# Patient Record
Sex: Female | Born: 1975 | Race: Black or African American | Hispanic: No | Marital: Married | Smoking: Never smoker
Health system: Southern US, Community
[De-identification: ages and names within clinical notes are randomized; demographics above are authoritative.]

## PROBLEM LIST (undated history)

## (undated) DIAGNOSIS — E119 Type 2 diabetes mellitus without complications: Secondary | ICD-10-CM

## (undated) DIAGNOSIS — E1169 Type 2 diabetes mellitus with other specified complication: Secondary | ICD-10-CM

## (undated) DIAGNOSIS — I1A0 Resistant hypertension: Secondary | ICD-10-CM

## (undated) DIAGNOSIS — E785 Hyperlipidemia, unspecified: Secondary | ICD-10-CM

## (undated) DIAGNOSIS — I10 Essential (primary) hypertension: Secondary | ICD-10-CM

## (undated) HISTORY — DX: Resistant hypertension: I1A.0

## (undated) HISTORY — DX: Morbid (severe) obesity due to excess calories: E66.01

## (undated) HISTORY — DX: Type 2 diabetes mellitus with other specified complication: E11.69

## (undated) HISTORY — DX: Type 2 diabetes mellitus with other specified complication: E78.5

---

## 2021-03-17 DIAGNOSIS — I110 Hypertensive heart disease with heart failure: Secondary | ICD-10-CM

## 2021-03-17 DIAGNOSIS — I5032 Chronic diastolic (congestive) heart failure: Secondary | ICD-10-CM

## 2021-03-17 HISTORY — DX: Hypertensive heart disease with heart failure: I11.0

## 2021-03-17 HISTORY — DX: Chronic diastolic (congestive) heart failure: I50.32

## 2021-03-17 HISTORY — PX: TRANSTHORACIC ECHOCARDIOGRAM: SHX275

## 2021-03-25 ENCOUNTER — Emergency Department (HOSPITAL_COMMUNITY): Payer: BC Managed Care – PPO

## 2021-03-25 ENCOUNTER — Other Ambulatory Visit: Payer: Self-pay

## 2021-03-25 ENCOUNTER — Encounter (HOSPITAL_COMMUNITY): Payer: Self-pay | Admitting: Emergency Medicine

## 2021-03-25 ENCOUNTER — Inpatient Hospital Stay (HOSPITAL_COMMUNITY)
Admission: EM | Admit: 2021-03-25 | Discharge: 2021-03-27 | DRG: 291 | Disposition: A | Discharge status: Home / Self Care | Payer: BC Managed Care – PPO | Attending: Internal Medicine | Admitting: Internal Medicine

## 2021-03-25 DIAGNOSIS — D509 Iron deficiency anemia, unspecified: Secondary | ICD-10-CM | POA: Diagnosis present

## 2021-03-25 DIAGNOSIS — R0602 Shortness of breath: Secondary | ICD-10-CM | POA: Diagnosis not present

## 2021-03-25 DIAGNOSIS — Z8673 Personal history of transient ischemic attack (TIA), and cerebral infarction without residual deficits: Secondary | ICD-10-CM

## 2021-03-25 DIAGNOSIS — Z833 Family history of diabetes mellitus: Secondary | ICD-10-CM

## 2021-03-25 DIAGNOSIS — E785 Hyperlipidemia, unspecified: Secondary | ICD-10-CM | POA: Diagnosis present

## 2021-03-25 DIAGNOSIS — R079 Chest pain, unspecified: Secondary | ICD-10-CM

## 2021-03-25 DIAGNOSIS — I5033 Acute on chronic diastolic (congestive) heart failure: Secondary | ICD-10-CM

## 2021-03-25 DIAGNOSIS — I110 Hypertensive heart disease with heart failure: Principal | ICD-10-CM | POA: Diagnosis present

## 2021-03-25 DIAGNOSIS — D75839 Thrombocytosis, unspecified: Secondary | ICD-10-CM | POA: Diagnosis present

## 2021-03-25 DIAGNOSIS — E1169 Type 2 diabetes mellitus with other specified complication: Secondary | ICD-10-CM

## 2021-03-25 DIAGNOSIS — Z6841 Body Mass Index (BMI) 40.0 and over, adult: Secondary | ICD-10-CM

## 2021-03-25 DIAGNOSIS — E1165 Type 2 diabetes mellitus with hyperglycemia: Secondary | ICD-10-CM | POA: Diagnosis present

## 2021-03-25 DIAGNOSIS — E876 Hypokalemia: Secondary | ICD-10-CM | POA: Diagnosis present

## 2021-03-25 DIAGNOSIS — I422 Other hypertrophic cardiomyopathy: Secondary | ICD-10-CM | POA: Diagnosis present

## 2021-03-25 DIAGNOSIS — E119 Type 2 diabetes mellitus without complications: Secondary | ICD-10-CM

## 2021-03-25 DIAGNOSIS — I160 Hypertensive urgency: Secondary | ICD-10-CM | POA: Diagnosis present

## 2021-03-25 DIAGNOSIS — I5031 Acute diastolic (congestive) heart failure: Secondary | ICD-10-CM | POA: Diagnosis present

## 2021-03-25 DIAGNOSIS — R778 Other specified abnormalities of plasma proteins: Secondary | ICD-10-CM | POA: Diagnosis present

## 2021-03-25 DIAGNOSIS — Z8249 Family history of ischemic heart disease and other diseases of the circulatory system: Secondary | ICD-10-CM

## 2021-03-25 DIAGNOSIS — Z20822 Contact with and (suspected) exposure to covid-19: Secondary | ICD-10-CM | POA: Diagnosis present

## 2021-03-25 DIAGNOSIS — F419 Anxiety disorder, unspecified: Secondary | ICD-10-CM | POA: Diagnosis present

## 2021-03-25 DIAGNOSIS — Z79899 Other long term (current) drug therapy: Secondary | ICD-10-CM

## 2021-03-25 HISTORY — DX: Type 2 diabetes mellitus without complications: E11.9

## 2021-03-25 HISTORY — DX: Essential (primary) hypertension: I10

## 2021-03-25 LAB — BASIC METABOLIC PANEL
Anion gap: 11 (ref 5–15)
BUN: 20 mg/dL (ref 6–20)
CO2: 25 mmol/L (ref 22–32)
Calcium: 9.4 mg/dL (ref 8.9–10.3)
Chloride: 99 mmol/L (ref 98–111)
Creatinine, Ser: 1.03 mg/dL — ABNORMAL HIGH (ref 0.44–1.00)
GFR, Estimated: >60 mL/min (ref >60)
Glucose, Bld: 312 mg/dL — ABNORMAL HIGH (ref 70–99)
Potassium: 3.6 mmol/L (ref 3.5–5.1)
Sodium: 135 mmol/L (ref 135–145)

## 2021-03-25 LAB — URINALYSIS, ROUTINE W REFLEX MICROSCOPIC
Bacteria, UA: NONE SEEN
Bilirubin Urine: NEGATIVE
Glucose, UA: >=500 mg/dL — AB
Hgb urine dipstick: NEGATIVE
Ketones, ur: NEGATIVE mg/dL
Leukocytes,Ua: NEGATIVE
Nitrite: NEGATIVE
Protein, ur: NEGATIVE mg/dL
Specific Gravity, Urine: 1.029 (ref 1.005–1.030)
pH: 6 (ref 5.0–8.0)

## 2021-03-25 LAB — I-STAT BETA HCG BLOOD, ED (MC, WL, AP ONLY): I-stat hCG, quantitative: <5 m[IU]/mL (ref <5)

## 2021-03-25 LAB — CBC
HCT: 36.9 % (ref 36.0–46.0)
Hemoglobin: 10.6 g/dL — ABNORMAL LOW (ref 12.0–15.0)
MCH: 20.3 pg — ABNORMAL LOW (ref 26.0–34.0)
MCHC: 28.7 g/dL — ABNORMAL LOW (ref 30.0–36.0)
MCV: 70.7 fL — ABNORMAL LOW (ref 80.0–100.0)
Platelets: 420 10*3/uL — ABNORMAL HIGH (ref 150–400)
RBC: 5.22 MIL/uL — ABNORMAL HIGH (ref 3.87–5.11)
RDW: 18 % — ABNORMAL HIGH (ref 11.5–15.5)
WBC: 9.1 10*3/uL (ref 4.0–10.5)
nRBC: 0 % (ref 0.0–0.2)

## 2021-03-25 LAB — I-STAT VENOUS BLOOD GAS, ED
Acid-Base Excess: 3 mmol/L — ABNORMAL HIGH (ref 0.0–2.0)
Bicarbonate: 28.9 mmol/L — ABNORMAL HIGH (ref 20.0–28.0)
Calcium, Ion: 1.24 mmol/L (ref 1.15–1.40)
HCT: 39 % (ref 36.0–46.0)
Hemoglobin: 13.3 g/dL (ref 12.0–15.0)
O2 Saturation: 85 %
Potassium: 3.6 mmol/L (ref 3.5–5.1)
Sodium: 135 mmol/L (ref 135–145)
TCO2: 30 mmol/L (ref 22–32)
pCO2, Ven: 48.7 mmHg (ref 44–60)
pH, Ven: 7.382 (ref 7.25–7.43)
pO2, Ven: 52 mmHg — ABNORMAL HIGH (ref 32–45)

## 2021-03-25 LAB — CBG MONITORING, ED: Glucose-Capillary: 365 mg/dL — ABNORMAL HIGH (ref 70–99)

## 2021-03-25 LAB — BRAIN NATRIURETIC PEPTIDE: B Natriuretic Peptide: 224.7 pg/mL — ABNORMAL HIGH (ref 0.0–100.0)

## 2021-03-25 LAB — TROPONIN I (HIGH SENSITIVITY)
Troponin I (High Sensitivity): 29 ng/L — ABNORMAL HIGH (ref <18)
Troponin I (High Sensitivity): 36 ng/L — ABNORMAL HIGH (ref <18)

## 2021-03-25 MED ORDER — ASPIRIN 325 MG PO TABS
325.0000 mg | ORAL_TABLET | Freq: Every day | ORAL | Status: DC
  Administered 2021-03-26 – 2021-03-27 (×2): 325 mg via ORAL
  Filled 2021-03-25 (×2): qty 1

## 2021-03-25 MED ORDER — METOPROLOL TARTRATE 25 MG PO TABS
12.5000 mg | ORAL_TABLET | Freq: Once | ORAL | Status: AC
  Administered 2021-03-25: 12.5 mg via ORAL
  Filled 2021-03-25: qty 1

## 2021-03-25 MED ORDER — IOHEXOL 350 MG/ML SOLN
80.0000 mL | Freq: Once | INTRAVENOUS | Status: AC | PRN
  Administered 2021-03-25: 22:00:00 80 mL via INTRAVENOUS

## 2021-03-25 MED ORDER — LABETALOL HCL 5 MG/ML IV SOLN
10.0000 mg | Freq: Once | INTRAVENOUS | Status: AC
  Administered 2021-03-25: 22:00:00 10 mg via INTRAVENOUS
  Filled 2021-03-25: qty 4

## 2021-03-25 NOTE — ED Provider Notes (Signed)
[facility] Provider Note   CSN: [id number] Arrival date & time: 03/25/21  1352     History  Chief Complaint  Patient presents with   Shortness of Breath    Kim Werner is a 46 y.o. female presenting today for intermittent shortness of breath and palpitations over the last week, and new onset of chest pain today.  Shortness of breath occurs randomly and lasts no more than an hour.  Patient unsure of what preceded the symptoms a week ago.  Denies recent fever or upper respiratory illness.  Chest pain described as central and nonradiating.  Believes shortness of breath due to pain with deep inspiration.  Normal blood glucose ranges between 200-300.  Currently takes amlodipine 5 mg daily for blood pressure management.  Denies headache, numbness/tingling, facial asymmetry, or other neurodeficits.  Denies urinary or GI symptoms  Hx of diabetes mellitus and hypertension.  Hx of TIA  The history is provided by the patient and medical records.  Shortness of Breath Associated symptoms: chest pain       Home Medications Prior to Admission medications   Not on File      Allergies    Patient has no known allergies.    Review of Systems   Review of Systems  Respiratory:  Positive for shortness of breath.   Cardiovascular:  Positive for chest pain.   Physical Exam Updated Vital Signs BP (!) 198/123    Pulse 100    Temp 98.6 F (37 C) (Oral)    Resp (!) 24    LMP 03/18/2021    SpO2 100%  Physical Exam Vitals and nursing note reviewed.  Constitutional:      General: She is not in acute distress.    Appearance: Normal appearance. She is well-developed. She is obese. She is not ill-appearing or diaphoretic.  HENT:     Head: Normocephalic and atraumatic.     Mouth/Throat:     Mouth: Mucous membranes are moist.     Pharynx: Oropharynx is clear.  Eyes:     General: No scleral icterus.    Conjunctiva/sclera: Conjunctivae normal.   Cardiovascular:     Rate and Rhythm: Regular rhythm. Tachycardia present.     Pulses: Normal pulses.     Heart sounds: Normal heart sounds. No murmur heard. Pulmonary:     Effort: Pulmonary effort is normal. Tachypnea present. No accessory muscle usage or respiratory distress.     Breath sounds: No stridor. Wheezing (Mild) present.  Chest:     Chest wall: No tenderness.  Abdominal:     Palpations: Abdomen is soft.     Tenderness: There is no abdominal tenderness.  Musculoskeletal:        General: No swelling.     Cervical back: Neck supple.     Right lower leg: No edema.     Left lower leg: No edema.  Skin:    General: Skin is warm and dry.     Capillary Refill: Capillary refill takes less than 2 seconds.     Coloration: Skin is not pale.  Neurological:     Mental Status: She is alert and oriented to person, place, and time.     Motor: No weakness.  Psychiatric:        Mood and Affect: Mood normal.    ED Results / Procedures / Treatments   Labs (all labs ordered are listed, but only abnormal results are displayed) Labs Reviewed  BASIC METABOLIC PANEL -  Abnormal; Notable for the following components:      Result Value   Glucose, Bld 312 (*)    Creatinine, Ser 1.03 (*)    All other components within normal limits  CBC - Abnormal; Notable for the following components:   RBC 5.22 (*)    Hemoglobin 10.6 (*)    MCV 70.7 (*)    MCH 20.3 (*)    MCHC 28.7 (*)    RDW 18.0 (*)    Platelets 420 (*)    All other components within normal limits  URINALYSIS, ROUTINE W REFLEX MICROSCOPIC - Abnormal; Notable for the following components:   Color, Urine STRAW (*)    Glucose, UA >=500 (*)    All other components within normal limits  BRAIN NATRIURETIC PEPTIDE - Abnormal; Notable for the following components:   B Natriuretic Peptide 224.7 (*)    All other components within normal limits  I-STAT VENOUS BLOOD GAS, ED - Abnormal; Notable for the following components:   pO2, Ven 52  (*)    Bicarbonate 28.9 (*)    Acid-Base Excess 3.0 (*)    All other components within normal limits  TROPONIN I (HIGH SENSITIVITY) - Abnormal; Notable for the following components:   Troponin I (High Sensitivity) 29 (*)    All other components within normal limits  TROPONIN I (HIGH SENSITIVITY) - Abnormal; Notable for the following components:   Troponin I (High Sensitivity) 36 (*)    All other components within normal limits  I-STAT BETA HCG BLOOD, ED ([facility], [facility], [facility] ONLY)    EKG EKG Interpretation  Date/Time:  Thursday March 25 2021 14:09:35 EST Ventricular Rate:  103 PR Interval:  170 QRS Duration: 78 QT Interval:  364 QTC Calculation: 476 R Axis:   64 Text Interpretation: Sinus tachycardia Otherwise normal ECG No previous ECGs available Confirmed by Coralee Pesa [phone]) on 03/25/2021 8:40:13 PM  Radiology DG Chest 2 View  Result Date: 03/25/2021 CLINICAL DATA:  Shortness of breath EXAM: CHEST - 2 VIEW COMPARISON:  None. FINDINGS: The heart size and mediastinal contours are within normal limits. Low lung volumes with prominence of the bronchovascular markings. No focal consolidation or pleural effusion. The visualized skeletal structures are unremarkable. IMPRESSION: No active cardiopulmonary disease. Electronically Signed   By: Larose Hires D.O.   On: 03/25/2021 14:57    Procedures Procedures    Medications Ordered in ED Medications  labetalol (NORMODYNE) injection 10 mg (10 mg Intravenous Given 03/25/21 2136)    ED Course/ Medical Decision Making/ A&P Clinical Course as of 03/25/21 2203  Thu Mar 25, 2021  2037 Glucose(!): 312 [AC]    Clinical Course User Index [AC] Cecil Cobbs, PA-C                           Medical Decision Making Amount and/or Complexity of Data Reviewed External Data Reviewed: notes. Labs: ordered. Decision-making details documented in ED Course. Radiology: ordered and independent interpretation performed. Decision-making details  documented in ED Course. ECG/medicine tests: ordered and independent interpretation performed. Decision-making details documented in ED Course.  Risk OTC drugs. Prescription drug management. Decision regarding hospitalization.   46 y.o. female presents to the ED for concern of Shortness of Breath, palpitations, and chest pain.  This involves an extensive number of treatment options, and is a complaint that carries with it a high risk of complications and morbidity.  The differential diagnosis includes acute coronary syndrome, musculoskeletal benign cause, pulmonary embolism,  pneumothorax, rib fracture  Comorbidities that complicate the patient evaluation include chronic hypertension, history of TIA  Additional history obtained from internal/external records available via epic  Interpretation: I ordered, and personally interpreted labs.  The pertinent results include: Elevated troponin of 36.  Electrolytes unremarkable.  Mild elevated creatinine 1.03.  Slightly decreased hemoglobin of 10.6, but normal WBC not suggestive of infection/inflammatory process.  I ordered imaging studies including chest x-ray and CTA of the chest.  I independently visualized and interpreted chest x-ray which showed no active cardiopulmonary disease or process.  I also independently visualized and interpreted the CTA of the chest showed no evidence of pulmonary embolism.  I agree with the radiologist interpretation  Intervention: I ordered medication including labetalol for blood pressure management.  Reevaluation of the patient after these medicines showed that the patient remained hypertensive.  I ordered metoprolol for added blood pressure management.  Reevaluation of the patient after this intervention has showed decreased blood pressure on repeat vitals and mild improvement of symptoms.  I have reviewed the patients home medicines and have made adjustments as needed  The patient was maintained on a cardiac  monitor.  I personally viewed and interpreted the cardiac monitored which showed an underlying rhythm of: Sinus tachycardia and normal sinus rhythm  I requested consultation with the Hospitalist team, and discussed lab and imaging findings as well as pertinent plan - they recommend: admission for further evaluation and treatment.  ED Course: Considered GERD or esophagitis, but physical exam, patient history, and lab findings not supportive of this.  Considered pulmonary edema or pneumothorax, but imaging not supportive of this.  Elevated troponins and history of patient proposed strong suspicion for cardiac nature.  Mildly elevated BNP around 220 not significant enough to suggest severe heart failure, especially in consideration with imaging, physical exam, and other labs.  No rib fracture appreciated on chest x-ray nor correlated on physical exam.  Chest x-ray and CTA scan negative for pulmonary embolism, pneumothorax, pulmonary edema, pneumonia, or other acute pulmonary pathology.  EKG not suggestive of STEMI or other signs of ischemia or arrhythmias, but does not rule out acute coronary syndrome or arrhythmias.  Patient has chronic history of hypertension, but states it is never been as high as this or having accompanying symptoms.  Patient understands and admits she is a poorly controlled diabetic.  Labs, history, presentation not suggestive of DKA or other acute metabolic process or acidosis.  May be more of a incidental finding than a direct contributing factor to the patient's presentation.  Overall, I am uncertain the exact etiology of the patient's symptoms.  I believe further work-up and evaluation would best benefit this patient, as I am concerned of the combination of continued hypertension, elevated troponins, and patient symptomology/complaints without clear cause.  Disposition: I discussed the patient and their case with my attending, Dr. Wilkie Aye, who agreed with the proposed treatment course.   After consideration of the diagnostic results and the patient's response to treatment, I feel that the patent would benefit from admission for further evaluation and treatment.  Discussed course of treatment thoroughly with the patient and her husband, whom demonstrated understanding.  Patient in agreement and has no further questions.        Final Clinical Impression(s) / ED Diagnoses Final diagnoses:  Shortness of breath  Chest pain, unspecified type    Rx / DC Orders ED Discharge Orders     None         Cecil Cobbs, PA-C  03/26/21 0155    Rozelle Logan, DO 03/27/21 [phone]

## 2021-03-25 NOTE — ED Provider Triage Note (Signed)
Emergency Medicine Provider Triage Evaluation Note ? ?Kim Werner , a 46 y.o. female  was evaluated in triage.  Pt complains of chest pain and shortness of breath.  Patient states that today she was going into school where she denies when she began having shortness of breath.  She states that she felt like she was hyperventilating and had to go outside.  She states that after she began having shortness of breath she began having central, nonradiating chest pain that she describes as pressure.  She states she continues to have some mild chest pain but the shortness of breath has improved.  She denies palpitations, lightheadedness or dizziness, syncope, lower extremity swelling.. ? ?Review of Systems  ?Positive: See above ?Negative:  ? ?Physical Exam  ?BP (!) 219/138 (BP Location: Right Arm)   Pulse (!) 101   Temp 98.7 ?F (37.1 ?C)   Resp 20   SpO2 100%  ?Gen:   Awake, no distress   ?Resp:  Normal effort, lungs clear to auscultation bilaterally ?MSK:   Moves extremities without difficulty  ?Other:  S1/S2 without murmur.  No lower extremity swelling. ? ?Medical Decision Making  ?Medically screening exam initiated at 2:31 PM.  Appropriate orders placed.  Kim Stephannie Broner was informed that the remainder of the evaluation will be completed by another provider, this initial triage assessment does not replace that evaluation, and the importance of remaining in the ED until their evaluation is complete. ? ?Notably hypertensive and triage.  Initiating chest pain work-up ?  ?Cristopher Peru, PA-C ?03/25/21 1432 ? ?

## 2021-03-25 NOTE — ED Notes (Signed)
[place] PA notified regarding pts BP 196/124 ?

## 2021-03-25 NOTE — ED Notes (Signed)
Patient transported to CT 

## 2021-03-25 NOTE — ED Triage Notes (Signed)
Patient here with complaint of shortness of breath that started one week ago, came in today because shortness of breath is now accompanied by chest pain with inspiration. Patient alert, oriented, speaking in complete sentences, and in no  apparent distress at this time. ?

## 2021-03-26 ENCOUNTER — Encounter (HOSPITAL_COMMUNITY): Payer: Self-pay | Admitting: Family Medicine

## 2021-03-26 ENCOUNTER — Other Ambulatory Visit: Payer: Self-pay

## 2021-03-26 ENCOUNTER — Other Ambulatory Visit (HOSPITAL_COMMUNITY): Payer: Self-pay

## 2021-03-26 ENCOUNTER — Observation Stay (HOSPITAL_COMMUNITY): Payer: BC Managed Care – PPO

## 2021-03-26 DIAGNOSIS — F419 Anxiety disorder, unspecified: Secondary | ICD-10-CM | POA: Diagnosis present

## 2021-03-26 DIAGNOSIS — Z833 Family history of diabetes mellitus: Secondary | ICD-10-CM | POA: Diagnosis not present

## 2021-03-26 DIAGNOSIS — Z8249 Family history of ischemic heart disease and other diseases of the circulatory system: Secondary | ICD-10-CM | POA: Diagnosis not present

## 2021-03-26 DIAGNOSIS — I5031 Acute diastolic (congestive) heart failure: Secondary | ICD-10-CM | POA: Diagnosis not present

## 2021-03-26 DIAGNOSIS — I422 Other hypertrophic cardiomyopathy: Secondary | ICD-10-CM | POA: Diagnosis present

## 2021-03-26 DIAGNOSIS — I5033 Acute on chronic diastolic (congestive) heart failure: Secondary | ICD-10-CM

## 2021-03-26 DIAGNOSIS — I161 Hypertensive emergency: Secondary | ICD-10-CM | POA: Diagnosis not present

## 2021-03-26 DIAGNOSIS — E119 Type 2 diabetes mellitus without complications: Secondary | ICD-10-CM

## 2021-03-26 DIAGNOSIS — I110 Hypertensive heart disease with heart failure: Secondary | ICD-10-CM | POA: Diagnosis present

## 2021-03-26 DIAGNOSIS — D509 Iron deficiency anemia, unspecified: Secondary | ICD-10-CM | POA: Diagnosis present

## 2021-03-26 DIAGNOSIS — R079 Chest pain, unspecified: Secondary | ICD-10-CM

## 2021-03-26 DIAGNOSIS — Z20822 Contact with and (suspected) exposure to covid-19: Secondary | ICD-10-CM | POA: Diagnosis present

## 2021-03-26 DIAGNOSIS — E1169 Type 2 diabetes mellitus with other specified complication: Secondary | ICD-10-CM

## 2021-03-26 DIAGNOSIS — R0789 Other chest pain: Secondary | ICD-10-CM | POA: Diagnosis not present

## 2021-03-26 DIAGNOSIS — E1165 Type 2 diabetes mellitus with hyperglycemia: Secondary | ICD-10-CM | POA: Diagnosis present

## 2021-03-26 DIAGNOSIS — R778 Other specified abnormalities of plasma proteins: Secondary | ICD-10-CM | POA: Diagnosis present

## 2021-03-26 DIAGNOSIS — R0602 Shortness of breath: Secondary | ICD-10-CM | POA: Diagnosis present

## 2021-03-26 DIAGNOSIS — Z79899 Other long term (current) drug therapy: Secondary | ICD-10-CM | POA: Diagnosis not present

## 2021-03-26 DIAGNOSIS — E876 Hypokalemia: Secondary | ICD-10-CM | POA: Diagnosis present

## 2021-03-26 DIAGNOSIS — I5030 Unspecified diastolic (congestive) heart failure: Secondary | ICD-10-CM | POA: Diagnosis not present

## 2021-03-26 DIAGNOSIS — Z8673 Personal history of transient ischemic attack (TIA), and cerebral infarction without residual deficits: Secondary | ICD-10-CM | POA: Diagnosis not present

## 2021-03-26 DIAGNOSIS — I5043 Acute on chronic combined systolic (congestive) and diastolic (congestive) heart failure: Secondary | ICD-10-CM

## 2021-03-26 DIAGNOSIS — E785 Hyperlipidemia, unspecified: Secondary | ICD-10-CM | POA: Diagnosis present

## 2021-03-26 DIAGNOSIS — Z6841 Body Mass Index (BMI) 40.0 and over, adult: Secondary | ICD-10-CM | POA: Diagnosis not present

## 2021-03-26 DIAGNOSIS — I160 Hypertensive urgency: Secondary | ICD-10-CM | POA: Diagnosis present

## 2021-03-26 DIAGNOSIS — D75839 Thrombocytosis, unspecified: Secondary | ICD-10-CM | POA: Diagnosis present

## 2021-03-26 LAB — COMPREHENSIVE METABOLIC PANEL
ALT: 20 U/L (ref 0–44)
AST: 16 U/L (ref 15–41)
Albumin: 3.6 g/dL (ref 3.5–5.0)
Alkaline Phosphatase: 85 U/L (ref 38–126)
Anion gap: 13 (ref 5–15)
BUN: 19 mg/dL (ref 6–20)
CO2: 26 mmol/L (ref 22–32)
Calcium: 9 mg/dL (ref 8.9–10.3)
Chloride: 96 mmol/L — ABNORMAL LOW (ref 98–111)
Creatinine, Ser: 1.07 mg/dL — ABNORMAL HIGH (ref 0.44–1.00)
GFR, Estimated: >60 mL/min (ref >60)
Glucose, Bld: 369 mg/dL — ABNORMAL HIGH (ref 70–99)
Potassium: 3.4 mmol/L — ABNORMAL LOW (ref 3.5–5.1)
Sodium: 135 mmol/L (ref 135–145)
Total Bilirubin: 0.4 mg/dL (ref 0.3–1.2)
Total Protein: 7.4 g/dL (ref 6.5–8.1)

## 2021-03-26 LAB — CBC WITH DIFFERENTIAL/PLATELET
Abs Immature Granulocytes: 0.03 10*3/uL (ref 0.00–0.07)
Basophils Absolute: 0 10*3/uL (ref 0.0–0.1)
Basophils Relative: 0 %
Eosinophils Absolute: 0.1 10*3/uL (ref 0.0–0.5)
Eosinophils Relative: 2 %
HCT: 37.8 % (ref 36.0–46.0)
Hemoglobin: 11.1 g/dL — ABNORMAL LOW (ref 12.0–15.0)
Immature Granulocytes: 0 %
Lymphocytes Relative: 13 %
Lymphs Abs: 1 10*3/uL (ref 0.7–4.0)
MCH: 20.3 pg — ABNORMAL LOW (ref 26.0–34.0)
MCHC: 29.4 g/dL — ABNORMAL LOW (ref 30.0–36.0)
MCV: 69.2 fL — ABNORMAL LOW (ref 80.0–100.0)
Monocytes Absolute: 0.5 10*3/uL (ref 0.1–1.0)
Monocytes Relative: 7 %
Neutro Abs: 6 10*3/uL (ref 1.7–7.7)
Neutrophils Relative %: 78 %
Platelets: 384 10*3/uL (ref 150–400)
RBC: 5.46 MIL/uL — ABNORMAL HIGH (ref 3.87–5.11)
RDW: 18.6 % — ABNORMAL HIGH (ref 11.5–15.5)
WBC: 7.7 10*3/uL (ref 4.0–10.5)
nRBC: 0 % (ref 0.0–0.2)

## 2021-03-26 LAB — TROPONIN I (HIGH SENSITIVITY)
Troponin I (High Sensitivity): 33 ng/L — ABNORMAL HIGH (ref <18)
Troponin I (High Sensitivity): 32 ng/L — ABNORMAL HIGH (ref <18)

## 2021-03-26 LAB — ECHOCARDIOGRAM COMPLETE
Area-P 1/2: 3.42 cm2
Calc EF: 51.9 %
Height: 67 in
S' Lateral: 3 cm
Single Plane A2C EF: 56.8 %
Single Plane A4C EF: 41.5 %
Weight: 4201.6 oz

## 2021-03-26 LAB — MAGNESIUM: Magnesium: 1.7 mg/dL (ref 1.7–2.4)

## 2021-03-26 LAB — GLUCOSE, CAPILLARY
Glucose-Capillary: 311 mg/dL — ABNORMAL HIGH (ref 70–99)
Glucose-Capillary: 308 mg/dL — ABNORMAL HIGH (ref 70–99)
Glucose-Capillary: 314 mg/dL — ABNORMAL HIGH (ref 70–99)
Glucose-Capillary: 272 mg/dL — ABNORMAL HIGH (ref 70–99)
Glucose-Capillary: 369 mg/dL — ABNORMAL HIGH (ref 70–99)

## 2021-03-26 LAB — HEMOGLOBIN A1C
Hgb A1c MFr Bld: 11.7 % — ABNORMAL HIGH (ref 4.8–5.6)
Mean Plasma Glucose: 289.09 mg/dL

## 2021-03-26 LAB — RESP PANEL BY RT-PCR (FLU A&B, COVID) ARPGX2
Influenza A by PCR: NEGATIVE
Influenza B by PCR: NEGATIVE
SARS Coronavirus 2 by RT PCR: NEGATIVE

## 2021-03-26 LAB — HIV ANTIBODY (ROUTINE TESTING W REFLEX): HIV Screen 4th Generation wRfx: NONREACTIVE

## 2021-03-26 LAB — PHOSPHORUS: Phosphorus: 3.6 mg/dL (ref 2.5–4.6)

## 2021-03-26 MED ORDER — LABETALOL HCL 200 MG PO TABS
200.0000 mg | ORAL_TABLET | Freq: Two times a day (BID) | ORAL | Status: DC
Start: 2021-03-26 — End: 2021-03-27
  Administered 2021-03-26 – 2021-03-27 (×3): 200 mg via ORAL
  Filled 2021-03-26 (×3): qty 1

## 2021-03-26 MED ORDER — ALUM & MAG HYDROXIDE-SIMETH 200-200-20 MG/5ML PO SUSP
30.0000 mL | Freq: Once | ORAL | Status: AC
  Administered 2021-03-26: 30 mL via ORAL
  Filled 2021-03-26: qty 30

## 2021-03-26 MED ORDER — AMLODIPINE BESYLATE 10 MG PO TABS
10.0000 mg | ORAL_TABLET | Freq: Every day | ORAL | Status: DC
  Administered 2021-03-26 – 2021-03-27 (×2): 10 mg via ORAL
  Filled 2021-03-26 (×2): qty 1

## 2021-03-26 MED ORDER — INSULIN ASPART 100 UNIT/ML IJ SOLN
0.0000 [IU] | Freq: Three times a day (TID) | INTRAMUSCULAR | Status: DC
  Administered 2021-03-26: 15 [IU] via SUBCUTANEOUS
  Administered 2021-03-26: 8 [IU] via SUBCUTANEOUS
  Administered 2021-03-26 – 2021-03-27 (×3): 11 [IU] via SUBCUTANEOUS
  Administered 2021-03-27: 15 [IU] via SUBCUTANEOUS

## 2021-03-26 MED ORDER — INSULIN GLARGINE-YFGN 100 UNIT/ML ~~LOC~~ SOLN
10.0000 [IU] | Freq: Every day | SUBCUTANEOUS | Status: DC
  Administered 2021-03-26 – 2021-03-27 (×2): 10 [IU] via SUBCUTANEOUS
  Filled 2021-03-26 (×3): qty 0.1

## 2021-03-26 MED ORDER — LIDOCAINE VISCOUS HCL 2 % MT SOLN
15.0000 mL | Freq: Once | OROMUCOSAL | Status: AC
  Administered 2021-03-26: 15 mL via ORAL
  Filled 2021-03-26: qty 15

## 2021-03-26 MED ORDER — LISINOPRIL 20 MG PO TABS
20.0000 mg | ORAL_TABLET | Freq: Every day | ORAL | Status: DC
  Administered 2021-03-26: 20 mg via ORAL
  Filled 2021-03-26: qty 1

## 2021-03-26 MED ORDER — LABETALOL HCL 5 MG/ML IV SOLN
20.0000 mg | INTRAVENOUS | Status: DC | PRN
Start: 2021-03-26 — End: 2021-03-26
  Administered 2021-03-26: 20 mg via INTRAVENOUS
  Filled 2021-03-26: qty 4

## 2021-03-26 MED ORDER — ACETAMINOPHEN 325 MG PO TABS: 650.0000 mg | ORAL_TABLET | ORAL | Status: DC | PRN

## 2021-03-26 MED ORDER — ALPRAZOLAM 0.25 MG PO TABS: 0.2500 mg | ORAL_TABLET | Freq: Two times a day (BID) | ORAL | Status: DC | PRN

## 2021-03-26 MED ORDER — MAGNESIUM SULFATE 2 GM/50ML IV SOLN
2.0000 g | Freq: Once | INTRAVENOUS | Status: AC
  Administered 2021-03-26: 2 g via INTRAVENOUS
  Filled 2021-03-26: qty 50

## 2021-03-26 MED ORDER — ENOXAPARIN SODIUM 40 MG/0.4ML IJ SOSY
40.0000 mg | PREFILLED_SYRINGE | INTRAMUSCULAR | Status: DC
  Administered 2021-03-26: 40 mg via SUBCUTANEOUS
  Filled 2021-03-26 (×2): qty 0.4

## 2021-03-26 MED ORDER — FUROSEMIDE 10 MG/ML IJ SOLN
40.0000 mg | Freq: Two times a day (BID) | INTRAMUSCULAR | Status: DC
  Administered 2021-03-26 (×3): 40 mg via INTRAVENOUS
  Filled 2021-03-26 (×3): qty 4

## 2021-03-26 MED ORDER — METOPROLOL TARTRATE 25 MG PO TABS
25.0000 mg | ORAL_TABLET | Freq: Two times a day (BID) | ORAL | Status: DC
  Administered 2021-03-26: 25 mg via ORAL
  Filled 2021-03-26: qty 1

## 2021-03-26 MED ORDER — ONDANSETRON HCL 4 MG/2ML IJ SOLN: 4.0000 mg | Freq: Four times a day (QID) | INTRAMUSCULAR | Status: DC | PRN

## 2021-03-26 NOTE — Consult Note (Signed)
Cardiology Consultation:   Patient ID: Kim Werner MRN: [id number]; DOB: 28-Mar-1975  Admit date: 03/25/2021 Date of Consult: 03/26/2021  Primary Care Provider: Oneita HurtPcp, No [facility] Cardiologist: None  [facility] Electrophysiologist:  None   Patient Profile:   Kim Werner is a 46 y.o. female with uncontrolled HTN, DM2, and obesity who is being seen today for the evaluation of chest pain/SOB at the request of Dr. Arville CareMansy.  History of Present Illness:   [name] reports that she has had shortness of breath over the past week along with 2 episodes of central nonradiating chest pain that lasts 20 minutes and duration and not exacerbated with exertion and improved with rest.  The most recent sodium was 03/25/21.  She had missed her morning labetalol and amlodipine.  She does not frequently check her blood pressure at home but when she does it is often in the 160s systolic.  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   VS on ED arrival: BP 219/138, P 101, T 98.7, RR 20, O2 100%/RA.   During my evaluation her blood pressure was still not well controlled (174/117).  She remember that she took morphine and labetalol so unsure of the doses.  I called her husband and the review usually medications at home she currently is prescribed amlodipine 10 mg daily and labetalol 200 mg every 8 hours although she takes this twice daily.  She used to be on lisinopril however was taken off of this when she was pregnant with her daughter who is now 44 years old.  She had no side effects to that medication.  She has no family history of premature CAD or sudden cardiac death.  Past Medical History:  Diagnosis Date   Diabetes mellitus without complication (HCC)    Hypertension    History reviewed. No pertinent surgical history.   Home Medications:  Prior to Admission medications   Medication Sig Start Date  End Date Taking? Authorizing Provider  amLODipine (NORVASC) 2.5 MG tablet Take 2.5 mg by mouth daily.   Yes [provider]  cholecalciferol (VITAMIN D3) 25 MCG (1000 UNIT) tablet Take 1,000 Units by mouth daily.   Yes [provider]  ferrous sulfate 325 (65 FE) MG tablet Take 325 mg by mouth every other day.   Yes [provider]  metFORMIN (GLUCOPHAGE) 1000 MG tablet Take 1,000 mg by mouth 2 (two) times daily with a meal.   Yes [provider]    Inpatient Medications: Scheduled Meds:  amLODipine  10 mg Oral Daily   aspirin  325 mg Oral Daily   enoxaparin (LOVENOX) injection  40 mg Subcutaneous Q24H   furosemide  40 mg Intravenous BID   insulin aspart  0-15 Units Subcutaneous TID AC & HS   labetalol  200 mg Oral BID   lisinopril  20 mg Oral Daily   Continuous Infusions:  PRN Meds: acetaminophen, ALPRAZolam, ondansetron (ZOFRAN) IV  Allergies:   No Known Allergies  Social History:   Social History   Socioeconomic History   Marital status: Married    Spouse name: Not on file   Number of children: Not on file   Years of education: Not on file   Highest education level: Not on file  Occupational History   Not on file  Tobacco Use   Smoking status: Not on file   Smokeless tobacco: Not  on file  Substance and Sexual Activity   Alcohol use: Not on file   Drug use: Not on file   Sexual activity: Not on file  Other Topics Concern   Not on file  Social History Narrative   Not on file   Social Determinants of Health   Financial Resource Strain: Not on file  Food Insecurity: Not on file  Transportation Needs: Not on file  Physical Activity: Not on file  Stress: Not on file  Social Connections: Not on file  Intimate Partner Violence: Not on file    Family History:   History reviewed. No pertinent family history.   ROS:  Review of Systems: [y] = yes,  = no      General: Weight gain ; Weight loss ; Anorexia ; Fatigue [  ]; Fever ; Chills ; Weakness    Cardiac: Chest pain/pressure [y]; Resting SOB [y]; Exertional SOB [y]; Orthopnea [y]; Pedal Edema ; Palpitations ; Syncope ; Presyncope ; Paroxysmal nocturnal dyspnea    Pulmonary: Cough ; Wheezing ; Hemoptysis ; Sputum ; Snoring    GI: Vomiting ; Dysphagia ; Melena ; Hematochezia ; Heartburn ; Abdominal pain ; Constipation ; Diarrhea ; BRBPR    GU: Hematuria ; Dysuria ; Nocturia  Vascular: Pain in legs with walking ; Pain in feet with lying flat ; Non-healing sores ; Stroke ; TIA ; Slurred speech ;   Neuro: Headaches ; Vertigo ; Seizures ; Paresthesias ;Blurred vision ; Diplopia ; Vision changes    Ortho/Skin: Arthritis ; Joint pain ; Muscle pain ; Joint swelling ; Back Pain ; Rash    Psych: Depression ; Anxiety    Heme: Bleeding problems ; Clotting disorders ; Anemia    Endocrine: Diabetes ; Thyroid dysfunction    Physical Exam/Data:   Vitals:   03/26/21 0430 03/26/21 0454 03/26/21 0511 03/26/21 0624  BP: (!) 209/136 (!) 158/105  (!) 174/117  Pulse: 91 87 79 86  Resp:      Temp: 98.5 F (36.9 C)     TempSrc: Oral     SpO2: 98% 96% 100% 100%  Weight:      Height:       No intake or output data in the 24 hours ending 03/26/21 0634 Last 3 Weights 03/26/2021  Weight (lbs) 262 lb 9.6 oz  Weight (kg) 119.115 kg     Body mass index is 41.13 kg/m.  General:  Well nourished, well developed, in no acute distress HEENT: normal Lymph: no adenopathy Neck: no JVD Endocrine:  No thryomegaly Vascular: No carotid bruits; FA pulses 2+ bilaterally without bruits  Cardiac:  normal S1, S2; RRR; no murmur  Lungs:  clear to auscultation bilaterally, no wheezing, rhonchi or rales  Abd: soft, nontender, no hepatomegaly  Ext: no edema Musculoskeletal:  No deformities, BUE and BLE strength normal and equal Skin: warm  and dry  Neuro:  CNs 2-12 intact, no focal abnormalities noted Psych:  Normal affect   EKG:  The EKG was personally reviewed and demonstrates: (03/25/21, 14:09:35), ventricular rte of 103, PR 170 ms, QRS 78 ms, Qtc 476 ms Telemetry:  Telemetry was personally reviewed and demonstrates: NSR  Relevant CV Studies: none  Laboratory Data:  High Sensitivity Troponin:   Recent Labs  Lab 03/25/21 1426 03/25/21 1627 03/26/21 0131 03/26/21 0327  TROPONINIHS 29* 36* 33* 32*     Chemistry Recent Labs  Lab 03/25/21 1426 03/25/21 2103  NA 135 135  K 3.6 3.6  CL 99  --   CO2 25  --   GLUCOSE 312*  --   BUN 20  --   CREATININE 1.03*  --   CALCIUM 9.4  --   GFRNONAA >60  --   ANIONGAP 11  --     No results for input(s): PROT, ALBUMIN, AST, ALT, ALKPHOS, BILITOT in the last 168 hours. Hematology Recent Labs  Lab 03/25/21 1426 03/25/21 2103  WBC 9.1  --   RBC 5.22*  --   HGB 10.6* 13.3  HCT 36.9 39.0  MCV 70.7*  --   MCH 20.3*  --   MCHC 28.7*  --   RDW 18.0*  --   PLT 420*  --    BNP Recent Labs  Lab 03/25/21 2057  BNP 224.7*    DDimer No results for input(s): DDIMER in the last 168 hours.  Radiology/Studies:  DG Chest 2 View  Result Date: 03/25/2021 CLINICAL DATA:  Shortness of breath EXAM: CHEST - 2 VIEW COMPARISON:  None. FINDINGS: The heart size and mediastinal contours are within normal limits. Low lung volumes with prominence of the bronchovascular markings. No focal consolidation or pleural effusion. The visualized skeletal structures are unremarkable. IMPRESSION: No active cardiopulmonary disease. Electronically Signed   By: Larose Hires D.O.   On: 03/25/2021 14:57   CT Angio Chest PE W/Cm &/Or Wo Cm  Result Date: 03/25/2021 CLINICAL DATA:  Shortness of breath and chest pain with inspiration. EXAM: CT ANGIOGRAPHY CHEST WITH CONTRAST TECHNIQUE: Multidetector CT imaging of the chest was performed using the standard protocol during bolus administration of  intravenous contrast. Multiplanar CT image reconstructions and MIPs were obtained to evaluate the vascular anatomy. RADIATION DOSE REDUCTION: This exam was performed according to the departmental dose-optimization program which includes automated exposure control, adjustment of the mA and/or kV according to patient size and/or use of iterative reconstruction technique. CONTRAST:  16mL OMNIPAQUE IOHEXOL 350 MG/ML SOLN COMPARISON:  None. FINDINGS: Cardiovascular: There is no evidence of aortic aneurysm. The subsegmental pulmonary arteries are limited in evaluation secondary to suboptimal opacification with intravenous contrast. No evidence of pulmonary embolism. Normal heart size. No pericardial effusion. Mediastinum/Nodes: No enlarged mediastinal, hilar, or axillary lymph nodes. Thyroid gland, trachea, and esophagus demonstrate no significant findings. Lungs/Pleura: Very mild linear atelectasis is seen within the inferior aspect of the left upper lobe. Very mild, hazy ground-glass appearance of the lung parenchyma is seen within the bilateral lower lobes. There is no evidence of a pleural effusion or pneumothorax. Upper Abdomen: No acute abnormality. Musculoskeletal: No chest wall abnormality. No acute or significant osseous findings. Review of the MIP images confirms the above findings. IMPRESSION: 1. No evidence of pulmonary embolism or acute cardiopulmonary disease. Electronically Signed   By: Aram Candela M.D.   On: 03/25/2021 22:45    HEART score (3)  Assessment and Plan:   HTN urgency  Possible cardiac chest pain  Patient presents with symptoms of HFpEF and uncontrolled hypertension after missing her home blood pressure medications.  Following that she had an episode of abrupt chest discomfort resolved within a few minutes.  Risk factors for coronary disease include obesity, diabetes, and uncontrolled hypertension. Her troponins were  only mildly elevated and flat. She was likely has HFpEF given her  morbid obesity and longstanding hypertension.  Plan for echo today to ensure there are no wall motion abnormalities.  I would favor her symptoms to all be related to poorly controlled hypertension in the setting of missing home medications.  That said her blood pressure regimen is not optimal on just amlodipine and labetalol currently.  She has tolerated lisinopril in the past and should start back on this.  She likely will need 3-4 agents to control blood pressure.  It has not been controlled well at home.  - resume home labatelol 200 mg bid  - resume home amlodipine 10 mg daily  - start lisinopril 20 mg daily  - d/c metoprolol, d/c labetalol IV - TTE ordered   For questions or updates, please contact [facility] Please consult [email or web address] for contact info under   Signed, Linton Rump, MD  03/26/2021 6:34 AM

## 2021-03-26 NOTE — Progress Notes (Addendum)
Inpatient Diabetes Program Recommendations ? ?AACE/ADA: New Consensus Statement on Inpatient Glycemic Control  ? ?Target Ranges:  Prepandial:   less than 140 mg/dL ?     Peak postprandial:   less than 180 mg/dL (1-2 hours) ?     Critically ill patients:  140 - 180 mg/dL  ? ? Latest Reference Range & Units 03/26/21 05:02 03/26/21 07:44  ?Glucose-Capillary 70 - 99 mg/dL 311 (H) 308 (H)  ? ? Latest Reference Range & Units 03/26/21 03:50  ?Hemoglobin A1C 4.8 - 5.6 % 11.7 (H)  ? ?Review of Glycemic Control ? ?Diabetes history: DM2 ?Outpatient Diabetes medications: Metformin 1000 mg BID ?Current orders for Inpatient glycemic control: Novolog 0-15 units AC&HS ? ?Inpatient Diabetes Program Recommendations:   ? ?Insulin: Please consider ordering Semglee 10 units Q24H. ? ?HbgA1C: A1C 11.7% on 03/26/21 indicating an average glucose of 289 mg/dl over the past 2-3 months. ? ?Addendum 03/26/21@14 :15-Spoke with patient about diabetes and home regimen for diabetes control. Patient reports she recently moved from [place] to [facility] and has not got a new PCP in [place] yet. Patient reports that she is planning to call [facility] and get appointment to establish care there. Patient states that she is only taking Metformin 1000 mg BID for DM control. 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 Inquired about Wilder Glade and Trulicity and patient stated that her insurance stopped covering the medications so she has not taken those medications in a couple of years. Patient also noted that her husband took insulin (pens) and that she would sometimes use his insulin if her glucose was really high.   Patient has not been checking glucose at home due to "glucometer needs a battery".  Asked patient to please get a battery for the glucometer so she can start checking her glucose consistently.  Inquired about  prior A1C and patient reports last A1C was 9%. Discussed A1C results (11.7% on 03/26/21) and explained that current A1C indicates an average glucose of 289 mg/dl over the past 2-3 months. Discussed glucose and A1C goals. Discussed importance of checking CBGs and maintaining good CBG control to prevent long-term and short-term complications. Stressed to the patient the importance of improving glycemic control to prevent further complications from uncontrolled diabetes. Patient admits that she eats a lot of rice and she knows what she is suppose to be eating but she has not been doing well with following carb modified diet lately. Encouraged patient to get back on carb modified diet to help improve DM control. Informed patient that she will most likely be prescribed additional DM medication at discharge and that I wanted to review insulin just in case she is prescribed insulin at discharge. Patient states she is familiar with insulin pens as that is what her husband takes and she has given it to herself when she has used his insulin in the past.  Reviewed each step of using an insulin pen and patient stated she did not need to demonstrate how to use insulin pen because she already how to use it.  Informed patient that I would ask TOC to do a benefit check to see if her current insurance would cover [place] or Trulicity (or other medications in those drug classes).  Encouraged patient to get battery for glucometer, to make appointment with [facility] to establish care, follow carb modified diet, and take DM  medications consistently as prescribed at discharge. Patient verbalized understanding of information discussed and reports no further questions at this time related to diabetes. ? ?Thanks, ?Barnie Alderman, RN, MSN, CDE ?Diabetes Coordinator ?Inpatient Diabetes Program ?[phone] (Team Pager from 8am to 5pm) ? ? ?

## 2021-03-26 NOTE — Assessment & Plan Note (Signed)
-   The patient will be placed on supplement coverage with NovoLog 

## 2021-03-26 NOTE — Progress Notes (Signed)
? ?  Echocardiogram ?2D Echocardiogram has been performed. ? ?Kim Werner ?03/26/2021, 10:32 AM ?

## 2021-03-26 NOTE — Assessment & Plan Note (Addendum)
-   The patient will be admitted to a cardiac telemetry observation bed. ?- We will place on as needed IV labetalol. ?- We will continue antihypertensives. ?- We will add Nitropaste. ?- Anxiety will be managed. ?

## 2021-03-26 NOTE — Progress Notes (Signed)
Care started prior to midnight in the emergency room and patient was admitted early this morning after midnight by Dr. Eugenie Norrie and I am in current agreement with his assessment and plan.  Additional changes to plan of care been made accordingly.  The patient is a morbidly obese African-American female with a past medical history significant for but #2 diabetes mellitus type 2, hypertension and other comorbidities who presented to the ED with acute onset of worsening dyspnea and palpitations of the last week with associated orthopnea, proximal nocturnal dyspnea and dyspnea on exertion without any lower extremity edema.  She experienced chest pain yesterday and she graded 9 out of 10 in severity and described the pain as sharp pain with no nausea vomiting or diaphoresis.  She denied any radiation but upon arrival to the ED she is noted to have a significantly elevated blood pressure.  She denies any headaches or dizziness and when she came to the ED her blood pressure was 219/138 with a heart rate 101 otherwise she had normal vital signs.  CBC initially showed a hemoglobin/hematocrit of 10.6/36.9 and a slight thrombocytosis.  Urine pregnancy test was negative.  EKG showed sinus tachycardia with poor R wave progression and chest x-ray showed no acute cardiopulmonary disease.  In the ED she is given IV labetalol 12.5 mg and p.o. metoprolol and she was admitted to the cardiac unit.  Cardiology was consulted and felt that her symptoms were consistent with heart failure with preserved ejection fraction and uncontrolled hypertension.  They resumed her home medications of labetalol 200 mg twice daily and amlodipine 10 mg p.o. daily and started lisinopril 20 mg daily.  A TTE was ordered and currently she is being admitted treated for the following but not limited to: ? ?* Hypertensive urgency ?- The patient will be admitted to a cardiac telemetry observation bed however she meets inpatient criteria  ?- We will place on as  needed IV labetalol but this is being discontinued  ?- We will continue antihypertensives. ?- We will add Nitropaste. ?- Anxiety will be managed. ?-Continue to Monitor BP per Protocol  ?-Last BP reading was 177/111 ?  ?Chest pain ?- Just minimally elevated troponin I. ?- We will need to rule out ACS. ?- I believe her troponin I elevation is related to her hypertensive urgency and CHF. ?- Cardiology consult will be obtained. ?  ?Acute on Diastolic CHF (congestive heart failure) ([place]) ?- She likely has a new onset of acute CHF, suspect diastolic etiology. ?- BNP was 224.7 ?- Adequate BP control ?- We will obtain a 2D echo and showed EF of 50% with the Left Ventricle being mildly decreased and the left Ventricle demonstrated Wall Motion Abnormalities and also showed maximal septal hypertrophy 26 mm and severe asymmetric LVH. Per Cardiology If there is no systemic disease process that  could account for this hypertrophy, recommend outpatient CMR for futher charterization and to assist in evaluation for hypertrophic cardiomyopathy.  ?- Cardiology consult will be obtained. ?- Dr. Sidney Ace discussed the case with Dr. Renella Cunas. ?- She will be diuresed with IV Lasix. ?- Strict I's and O's and Daily weights  ?-Continue to [place] for S/Sx of Volume Overload  ?  ?Uncontrolled Type 2 diabetes mellitus without complications ([place]) ?- The patient will be placed on supplement coverage with NovoLog but will order Semglee 10 units q24h ?-Continue to Monitor CBGs closely  ?-Will need Insulin at D/C ?-Diabetes Education coordinator consulted  ? ?Hypokalemia ?-Mild at 3.4 ?-Mag Level was  1.7 ?-Replete ?-Continue to Monitor and Replete as Necessary ?-Repeat CMP and Mag Level in the AM  ? ?Microcytic Anemia ?-Patient's Hgb/Hct went from 10.6/36.9 -> 13.3/39.0 -> 11.1/37.8 with an MCV of 69.2 ?-Check Anemia Panel in the AM ?-Continue to Monitor for S/Sx of Bleeding; No over bleeding noted ?-Repeat CBC in the AM  ? ?Morbid  Obesity ?-Complicates overall prognosis and care ?-Estimated body mass index is 41.13 kg/m? as calculated from the following: ?  Height as of this encounter: 5\' 7"  (1.702 m). ?  Weight as of this encounter: 119.1 kg.  ?-Weight Loss and Dietary Counseling given ? ?We will continue to monitor patient's clinical response to intervention and repeat blood work and imaging in the a.m. follow-up on cardiology recommendations ? ?

## 2021-03-26 NOTE — Progress Notes (Signed)
Heart Failure Nurse Navigator Progress Note ? ?Navigation team following this hospitalization. Screening pending further testing/cardiac workup.  ? ?Echo sch for 3/10 ? ?Meredith Staggers, RN, BSN, CHFN ?Heart Failure Navigator ?Heart & Vascular Care Navigation Team ? ?

## 2021-03-26 NOTE — Assessment & Plan Note (Addendum)
-   Just minimally elevated troponin I. ?- We will need to rule out ACS. ?- I believe her troponin I elevation is related to her hypertensive urgency and CHF. ?- Cardiology consult will be obtained. ?

## 2021-03-26 NOTE — TOC Initial Note (Addendum)
Transition of Care (TOC) - Initial/Assessment Note  ? ? ?Patient Details  ?Name: Kim Werner ?MRN: [id number] ?Date of Birth: 1975-11-13 ? ?Transition of Care (TOC) CM/SW Contact:    ?Graves-Bigelow, Lamar Laundry, RN ?Phone Number: ?03/26/2021, 1:16 PM ? ?Clinical Narrative:  Case Manager received Heart Failure consult. Patient reports that she just recently moved from [place]. PTA patient was independent from home with the support of spouse. Patient states she has transportation and she gets Rx medications from [place] without any issues. Patient is currently looking into scheduling an appointment with PCP at [facility]. No further home needs identified at this time. Case Manager will continue to follow for additional transition of care needs.  ? ?03-26-21 1413 Benefits check submitted for  Farxiga/other SGLT2 inhibitor medications and Trulicity/other GLP-1 RA medications. Case Manager will follow for cost.  ? ? 03-26-21 1541 Case Manager working with pharmacy to check the cost of medications. Patient does not have a copy of her new card and the [facility] does not have the information on file. Case Manager has asked the patient to go online to see if she can see her benefits: the Rx Bin, PCN, ID #, and the Group #. Awaiting to see if the patient can acquire this information to check the above cost for medications.  ? ?1633 03-26-21 Patient unable to obtain the card information to check cost for medications.  ?  ?Expected Discharge Plan: Home/Self Care ?Barriers to Discharge: No Barriers Identified ? ? ?Patient Goals and CMS Choice ?Patient states their goals for this hospitalization and ongoing recovery are:: to return home ?  ?Choice offered to / list presented to : NA ? ?Expected Discharge Plan and Services ?Expected Discharge Plan: Home/Self Care ?In-house Referral: NA ?Discharge Planning Services: CM Consult ?Post Acute Care Choice: NA ?Living arrangements for the past 2 months: Apartment ?                 ?DME Arranged: N/A ?DME Agency: NA ?  ?  ?  ?HH Arranged: NA ?  ?  ?  ?  ? ?Prior Living Arrangements/Services ?Living arrangements for the past 2 months: Apartment ?Lives with:: Self, Spouse ?Patient language and need for interpreter reviewed:: Yes ?Do you feel safe going back to the place where you live?: Yes      ?Need for Family Participation in Patient Care: No (Comment) ?Care giver support system in place?: No (comment) ?  ?Criminal Activity/Legal Involvement Pertinent to Current Situation/Hospitalization: No - Comment as needed ? ?Activities of Daily Living ?Home Assistive Devices/Equipment: Blood pressure cuff ?ADL Screening (condition at time of admission) ?Patient's cognitive ability adequate to safely complete daily activities?: Yes ?Is the patient deaf or have difficulty hearing?: No ?Does the patient have difficulty seeing, even when wearing glasses/contacts?: No ?Does the patient have difficulty concentrating, remembering, or making decisions?: No ?Patient able to express need for assistance with ADLs?: Yes ?Does the patient have difficulty dressing or bathing?: No ?Independently performs ADLs?: Yes (appropriate for developmental age) ?Does the patient have difficulty walking or climbing stairs?: No ?Weakness of Legs: None ?Weakness of Arms/Hands: None ? ?Permission Sought/Granted ?Permission sought to share information with : Family Supports, Case Manager ?  ?   ? ?Emotional Assessment ?Appearance:: Appears stated age ?Attitude/Demeanor/Rapport: Engaged ?Affect (typically observed): Appropriate ?Orientation: : Oriented to  Time, Oriented to Situation, Oriented to Place, Oriented to Self ?Alcohol / Substance Use: Not Applicable ?Psych Involvement: No (comment) ? ?Admission diagnosis:  Shortness of  breath [R06.02] ?Hypertensive urgency [I16.0] ?Chest pain, unspecified type [R07.9] ?Patient Active Problem List  ? Diagnosis Date Noted  ? Hypertensive urgency 03/26/2021  ? Chest pain 03/26/2021  ?  Acute on chronic diastolic CHF (congestive heart failure) (HCC) 03/26/2021  ? Type 2 diabetes mellitus without complications (HCC) 03/26/2021  ? ?PCP:  Pcp, No ?Pharmacy:   ?[id number] - [place] ([place]), [place] - [address] ?[address] ?[place] ([place]) [place] [address] ?Phone: [phone] Fax: [phone] ? ?  ? ?Readmission Risk Interventions ?No flowsheet data found. ? ? ?

## 2021-03-26 NOTE — Progress Notes (Signed)
? ?  Brief cardiology follow-up note.  Patient was admitted early this morning with hypertensive urgency/accelerated hypertension blood pressure 219/138 with likely HFpEF.  Apparently she had not been on medications ? ?Plan was to resume home blood pressure medications which she has been out of. ?Blood pressures by this evening have been better controlled, but still likely would benefit from improved control which may simply be more doses of medications. ? ?She is no longer having chest pain and and no longer noting dyspnea... ?I do think that she probably would benefit from a diuretic although with significant hypertrophic cardiomyopathy noted on echo, would not want to be overly aggressive with diuretic. ? ?Echo did suggest significant LV hypertrophy and there was recommendation for cardiac MRI.  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 ? ? ?Would consider converting from labetalol to carvedilol, and potentially ACE inhibitor to ARB which would potentially allow converting to ARNI if indicated.  Also continue amlodipine. ?I do think potentially spironolactone plus or minus chlorthalidone would also be beneficial. ? ?No medication adjustments were made because she is just now been started on her medications I would like to see what happens after she has been adequately treated for 24 hours.  Will defer to the rounding team over the weekend for additional management. ? ?Would consider outpatient cardiac MRI which would also help clarify possible wall motion abnormality seen on the echocardiogram that was not clearly evident. ? ? ?I spent about 1520 minutes talking to the patient and family about the findings and the potential of it being either hypertension related or poor potentially HOCM. ? ? ?Bryan Lemma, MD ? ?

## 2021-03-26 NOTE — Plan of Care (Signed)

## 2021-03-26 NOTE — Progress Notes (Signed)
Pt awoke with SOB and called nursing staff.  O2 sat 93% on RA.  Pt states she does not use home O2 or CPAP, but she believes she may have (undiagnosed) OSA.  States she uses "about 6 pillows" at night.  O2 via [place] applied @ 2 lpm.  HOB raised to 30 degrees.  Will continue to monitor.  Kim Werner ?  ?

## 2021-03-26 NOTE — Assessment & Plan Note (Signed)
-   She likely has a new onset of acute CHF, suspect diastolic etiology. ?- Adequate BP control ?- We will obtain a 2D echo. ?- Cardiology consult will be obtained. ?- I discussed the case with Dr. Cherly Beach. ?- She will be diuresed with IV Lasix. ?

## 2021-03-26 NOTE — H&P (Signed)
[place]   PATIENT NAME: Kim Werner    MR#:  [id number]  DATE OF BIRTH:  [birth year]  DATE OF ADMISSION:  03/25/2021  PRIMARY CARE PHYSICIAN: Pcp, No   Patient is coming from: Home  REQUESTING/REFERRING PHYSICIAN: Prince Rome, PA-C  CHIEF COMPLAINT:   Chief Complaint  Patient presents with   Shortness of Breath    HISTORY OF PRESENT ILLNESS:  Kim Werner is a 46 y.o. female with medical history significant for type 2 diabetes mellitus and hypertension, presented to the emergency room with acute onset of worsening dyspnea and palpitations over the last week with associated orthopnea, paroxysmal nocturnal dyspnea as well as dyspnea on exertion without lower extremity edema.  She experienced chest pain today that she graded 9/10 in severity and described as a sharp pain with no nausea or vomiting or diaphoresis or radiation.  Her blood pressure was elevated.  She denies any headache or dizziness or blurred vision.  No cough or wheezing or hemoptysis.  No dysuria, oliguria or hematuria, urgency or frequency or flank pain. ED Course: When she came to the ER BP was 219/138 with a heart rate of 101 with otherwise normal vital signs.  Labs revealed VBG with pH 7.38 and PCO2 40.7 bicarbonate 20.9.  BNP was 224.7 and high-sensitivity troponin I was 36 and later 33.  UA showed more than 500 glucose. CBC showed initially hemoglobin of 10.6 and hematocrit 36.9.  15.3/39 with thrombocytosis of 420.  Urine pregnancy test was negative.  Blood glucose was 312. EKG as reviewed by me : EKG showed sinus tachycardia with a rate of 103 with slightly poor R wave progression. Imaging: Chest x-ray showed no acute cardiopulmonary disease.  The patient was given 10 mg of IV labetalol and 12.5 mg p.o. Lopressor as well as 325 mg of p.o. aspirin.  She will be admitted to an observation cardiac telemetry bed for further evaluation and management. PAST MEDICAL HISTORY:   Past  Medical History:  Diagnosis Date   Diabetes mellitus without complication ([place])    Hypertension     PAST SURGICAL HISTORY:  C-section once Bilateral breast biopsy.  SOCIAL HISTORY:   Social History   Tobacco Use   Smoking status: Not on file   Smokeless tobacco: Not on file  Substance Use Topics   Alcohol use: Not on file  She denies any history of tobacco or EtOH abuse or illicit drug use. FAMILY HISTORY:  Positive for diabetes mellitus in her mother and hypertension in her grandmother.  DRUG ALLERGIES:  No Known Allergies  REVIEW OF SYSTEMS:   ROS As per history of present illness. All pertinent systems were reviewed above. Constitutional, HEENT, cardiovascular, respiratory, GI, GU, musculoskeletal, neuro, psychiatric, endocrine, integumentary and hematologic systems were reviewed and are otherwise negative/unremarkable except for positive findings mentioned above in the HPI.   MEDICATIONS AT HOME:   Prior to Admission medications   Not on File      VITAL SIGNS:  Blood pressure (!) 170/107, pulse 91, temperature 98.6 F (37 C), temperature source Oral, resp. rate (!) 21, last menstrual period 03/18/2021, SpO2 98 %.  PHYSICAL EXAMINATION:  Physical Exam  GENERAL:  46 y.o.-year-old African-American female patient lying in the bed with no acute distress.  EYES: Pupils equal, round, reactive to light and accommodation. No scleral icterus. Extraocular muscles intact.  HEENT: Head atraumatic, normocephalic. Oropharynx and nasopharynx clear.  NECK:  Supple, no jugular venous distention. No thyroid  enlargement, no tenderness.  LUNGS: Slightly diminished bibasilar breath sounds, no wheezing now though had it earlier, rales,rhonchi or crepitation. No use of accessory muscles of respiration.  CARDIOVASCULAR: Regular rate and rhythm, S1, S2 normal. No murmurs, rubs, or gallops.  ABDOMEN: Soft, nondistended, nontender. Bowel sounds present. No organomegaly or mass.   EXTREMITIES: No pedal edema, cyanosis, or clubbing.  NEUROLOGIC: Cranial nerves II through XII are intact. Muscle strength 5/5 in all extremities. Sensation intact. Gait not checked.  PSYCHIATRIC: The patient is alert and oriented x 3.  Normal affect and good eye contact. SKIN: No obvious rash, lesion, or ulcer.   LABORATORY PANEL:   CBC Recent Labs  Lab 03/25/21 1426 03/25/21 2103  WBC 9.1  --   HGB 10.6* 13.3  HCT 36.9 39.0  PLT 420*  --    ------------------------------------------------------------------------------------------------------------------  Chemistries  Recent Labs  Lab 03/25/21 1426 03/25/21 2103  NA 135 135  K 3.6 3.6  CL 99  --   CO2 25  --   GLUCOSE 312*  --   BUN 20  --   CREATININE 1.03*  --   CALCIUM 9.4  --    ------------------------------------------------------------------------------------------------------------------  Cardiac Enzymes No results for input(s): TROPONINI in the last 168 hours. ------------------------------------------------------------------------------------------------------------------  RADIOLOGY:  DG Chest 2 View  Result Date: 03/25/2021 CLINICAL DATA:  Shortness of breath EXAM: CHEST - 2 VIEW COMPARISON:  None. FINDINGS: The heart size and mediastinal contours are within normal limits. Low lung volumes with prominence of the bronchovascular markings. No focal consolidation or pleural effusion. The visualized skeletal structures are unremarkable. IMPRESSION: No active cardiopulmonary disease. Electronically Signed   By: Keane Police D.O.   On: 03/25/2021 14:57   CT Angio Chest PE W/Cm &/Or Wo Cm  Result Date: 03/25/2021 CLINICAL DATA:  Shortness of breath and chest pain with inspiration. EXAM: CT ANGIOGRAPHY CHEST WITH CONTRAST TECHNIQUE: Multidetector CT imaging of the chest was performed using the standard protocol during bolus administration of intravenous contrast. Multiplanar CT image reconstructions and MIPs were  obtained to evaluate the vascular anatomy. RADIATION DOSE REDUCTION: This exam was performed according to the departmental dose-optimization program which includes automated exposure control, adjustment of the mA and/or kV according to patient size and/or use of iterative reconstruction technique. CONTRAST:  44mL OMNIPAQUE IOHEXOL 350 MG/ML SOLN COMPARISON:  None. FINDINGS: Cardiovascular: There is no evidence of aortic aneurysm. The subsegmental pulmonary arteries are limited in evaluation secondary to suboptimal opacification with intravenous contrast. No evidence of pulmonary embolism. Normal heart size. No pericardial effusion. Mediastinum/Nodes: No enlarged mediastinal, hilar, or axillary lymph nodes. Thyroid gland, trachea, and esophagus demonstrate no significant findings. Lungs/Pleura: Very mild linear atelectasis is seen within the inferior aspect of the left upper lobe. Very mild, hazy ground-glass appearance of the lung parenchyma is seen within the bilateral lower lobes. There is no evidence of a pleural effusion or pneumothorax. Upper Abdomen: No acute abnormality. Musculoskeletal: No chest wall abnormality. No acute or significant osseous findings. Review of the MIP images confirms the above findings. IMPRESSION: 1. No evidence of pulmonary embolism or acute cardiopulmonary disease. Electronically Signed   By: Virgina Norfolk M.D.   On: 03/25/2021 22:45      IMPRESSION AND PLAN:  Assessment and Plan: * Hypertensive urgency - The patient will be admitted to a cardiac telemetry observation bed. - We will place on as needed IV labetalol. - We will continue antihypertensives. - We will add Nitropaste. - Anxiety will be managed.  Chest pain -  Just minimally elevated troponin I. - We will need to rule out ACS. - I believe her troponin I elevation is related to her hypertensive urgency and CHF. - Cardiology consult will be obtained.  Acute on chronic diastolic CHF (congestive heart  failure) ([employer]) - She likely has a new onset of acute CHF, suspect diastolic etiology. - Adequate BP control - We will obtain a 2D echo. - Cardiology consult will be obtained. - I discussed the case with Dr. Renella Cunas. - She will be diuresed with IV Lasix.  Type 2 diabetes mellitus without complications ([place]) - The patient will be placed on supplement coverage with NovoLog.   DVT prophylaxis: Lovenox.  Advanced Care Planning:  Code Status: full code.  Family Communication:  The plan of care was discussed in details with the patient (and family). I answered all questions. The patient agreed to proceed with the above mentioned plan. Further management will depend upon hospital course. Disposition Plan: Back to previous home environment Consults called: Cardiology. All the records are reviewed and case discussed with ED provider.  Status is: Observation   I certify that at the time of admission, it is my clinical judgment that the patient will require  hospital care extending LESS than 2 midnights.                            Dispo: The patient is from: Home              Anticipated d/c is to: Home              Patient currently is not medically stable to d/c.              Difficult to place patient: No  Christel Mormon M.D on 03/26/2021 at 3:43 AM  [facility]   From 7 PM-7 AM, contact night-coverage [email or web address]  CC: Primary care physician; Pcp, No

## 2021-03-26 NOTE — Plan of Care (Signed)
?  Problem: Clinical Measurements: ?Goal: Ability to maintain clinical measurements within normal limits will improve ?03/26/2021 0538 by Colonel Bald, RN ?Outcome: Progressing ?03/26/2021 0537 by Colonel Bald, RN ?Outcome: Progressing ?Goal: Will remain free from infection ?03/26/2021 0538 by Colonel Bald, RN ?Outcome: Progressing ?03/26/2021 0537 by Colonel Bald, RN ?Outcome: Progressing ?Goal: Diagnostic test results will improve ?03/26/2021 0538 by Colonel Bald, RN ?Outcome: Progressing ?03/26/2021 0537 by Colonel Bald, RN ?Outcome: Progressing ?Goal: Respiratory complications will improve ?03/26/2021 0538 by Colonel Bald, RN ?Outcome: Progressing ?03/26/2021 0537 by Colonel Bald, RN ?Outcome: Progressing ?Goal: Cardiovascular complication will be avoided ?03/26/2021 0538 by Colonel Bald, RN ?Outcome: Progressing ?03/26/2021 0537 by Colonel Bald, RN ?Outcome: Progressing ?  ?

## 2021-03-27 ENCOUNTER — Inpatient Hospital Stay (HOSPITAL_COMMUNITY): Payer: BC Managed Care – PPO

## 2021-03-27 DIAGNOSIS — R0789 Other chest pain: Secondary | ICD-10-CM

## 2021-03-27 LAB — CBC WITH DIFFERENTIAL/PLATELET
Abs Immature Granulocytes: 0.03 10*3/uL (ref 0.00–0.07)
Basophils Absolute: 0 10*3/uL (ref 0.0–0.1)
Basophils Relative: 0 %
Eosinophils Absolute: 0.2 10*3/uL (ref 0.0–0.5)
Eosinophils Relative: 2 %
HCT: 35.1 % — ABNORMAL LOW (ref 36.0–46.0)
Hemoglobin: 10.1 g/dL — ABNORMAL LOW (ref 12.0–15.0)
Immature Granulocytes: 0 %
Lymphocytes Relative: 16 %
Lymphs Abs: 1.4 10*3/uL (ref 0.7–4.0)
MCH: 20.2 pg — ABNORMAL LOW (ref 26.0–34.0)
MCHC: 28.8 g/dL — ABNORMAL LOW (ref 30.0–36.0)
MCV: 70.3 fL — ABNORMAL LOW (ref 80.0–100.0)
Monocytes Absolute: 0.7 10*3/uL (ref 0.1–1.0)
Monocytes Relative: 8 %
Neutro Abs: 6.1 10*3/uL (ref 1.7–7.7)
Neutrophils Relative %: 74 %
Platelets: 393 10*3/uL (ref 150–400)
RBC: 4.99 MIL/uL (ref 3.87–5.11)
RDW: 18.3 % — ABNORMAL HIGH (ref 11.5–15.5)
WBC: 8.4 10*3/uL (ref 4.0–10.5)
nRBC: 0.2 % (ref 0.0–0.2)

## 2021-03-27 LAB — IRON AND TIBC
Iron: 38 ug/dL (ref 28–170)
Saturation Ratios: 10 % — ABNORMAL LOW (ref 10.4–31.8)
TIBC: 382 ug/dL (ref 250–450)
UIBC: 344 ug/dL

## 2021-03-27 LAB — LIPID PANEL
Cholesterol: 215 mg/dL — ABNORMAL HIGH (ref 0–200)
HDL: 49 mg/dL (ref >40)
LDL Cholesterol: 149 mg/dL — ABNORMAL HIGH (ref 0–99)
Total CHOL/HDL Ratio: 4.4 RATIO
Triglycerides: 85 mg/dL (ref <150)
VLDL: 17 mg/dL (ref 0–40)

## 2021-03-27 LAB — COMPREHENSIVE METABOLIC PANEL
ALT: 25 U/L (ref 0–44)
AST: 22 U/L (ref 15–41)
Albumin: 3.3 g/dL — ABNORMAL LOW (ref 3.5–5.0)
Alkaline Phosphatase: 71 U/L (ref 38–126)
Anion gap: 12 (ref 5–15)
BUN: 28 mg/dL — ABNORMAL HIGH (ref 6–20)
CO2: 26 mmol/L (ref 22–32)
Calcium: 8.8 mg/dL — ABNORMAL LOW (ref 8.9–10.3)
Chloride: 97 mmol/L — ABNORMAL LOW (ref 98–111)
Creatinine, Ser: 1.14 mg/dL — ABNORMAL HIGH (ref 0.44–1.00)
GFR, Estimated: >60 mL/min (ref >60)
Glucose, Bld: 260 mg/dL — ABNORMAL HIGH (ref 70–99)
Potassium: 3.1 mmol/L — ABNORMAL LOW (ref 3.5–5.1)
Sodium: 135 mmol/L (ref 135–145)
Total Bilirubin: 0.3 mg/dL (ref 0.3–1.2)
Total Protein: 6.5 g/dL (ref 6.5–8.1)

## 2021-03-27 LAB — RETICULOCYTES
Immature Retic Fract: 27.3 % — ABNORMAL HIGH (ref 2.3–15.9)
RBC.: 4.94 MIL/uL (ref 3.87–5.11)
Retic Count, Absolute: 88.9 10*3/uL (ref 19.0–186.0)
Retic Ct Pct: 1.8 % (ref 0.4–3.1)

## 2021-03-27 LAB — GLUCOSE, CAPILLARY
Glucose-Capillary: 303 mg/dL — ABNORMAL HIGH (ref 70–99)
Glucose-Capillary: 357 mg/dL — ABNORMAL HIGH (ref 70–99)

## 2021-03-27 LAB — FERRITIN: Ferritin: 21 ng/mL (ref 11–307)

## 2021-03-27 LAB — PHOSPHORUS: Phosphorus: 4 mg/dL (ref 2.5–4.6)

## 2021-03-27 LAB — MAGNESIUM: Magnesium: 2.1 mg/dL (ref 1.7–2.4)

## 2021-03-27 LAB — VITAMIN B12: Vitamin B-12: 703 pg/mL (ref 180–914)

## 2021-03-27 LAB — FOLATE: Folate: 9.5 ng/mL (ref >5.9)

## 2021-03-27 MED ORDER — AMLODIPINE BESYLATE 2.5 MG PO TABS: 2.5000 mg | ORAL_TABLET | Freq: Every day | ORAL | 0 refills | Status: DC

## 2021-03-27 MED ORDER — LOSARTAN POTASSIUM 50 MG PO TABS
50.0000 mg | ORAL_TABLET | Freq: Every day | ORAL | Status: DC
  Administered 2021-03-27: 50 mg via ORAL
  Filled 2021-03-27: qty 1

## 2021-03-27 MED ORDER — LOSARTAN POTASSIUM 50 MG PO TABS: 50.0000 mg | ORAL_TABLET | Freq: Every day | ORAL | 0 refills | Status: DC

## 2021-03-27 MED ORDER — INSULIN GLARGINE-YFGN 100 UNIT/ML ~~LOC~~ SOLN: 20.0000 [IU] | Freq: Every day | SUBCUTANEOUS | Status: DC

## 2021-03-27 MED ORDER — LABETALOL HCL 200 MG PO TABS: 200.0000 mg | ORAL_TABLET | Freq: Two times a day (BID) | ORAL | 0 refills | Status: DC

## 2021-03-27 MED ORDER — INSULIN GLARGINE-YFGN 100 UNIT/ML ~~LOC~~ SOPN: 20.0000 [IU] | PEN_INJECTOR | Freq: Every day | SUBCUTANEOUS | 0 refills | Status: DC

## 2021-03-27 MED ORDER — INSULIN PEN NEEDLE 31G X 5 MM MISC
1.0000 | Freq: Every day | 0 refills | Status: DC
Start: 2021-03-27 — End: 2021-08-06

## 2021-03-27 MED ORDER — BLOOD GLUCOSE MONITOR KIT: PACK | 0 refills | Status: DC

## 2021-03-27 NOTE — Progress Notes (Incomplete)
° °  Brief cardiology follow-up note.  Patient was admitted early this morning with hypertensive urgency/accelerated hypertension blood pressure 219/138 with likely HFpEF.  Apparently she had not been on medications  Plan was to resume home blood pressure medications which she has been out of. Blood pressures by this evening have been better controlled, but still likely would benefit from improved control which may simply be more doses of medications.  She is no longer having chest pain and and no longer noting dyspnea... I do think that she probably would benefit from a diuretic although with significant hypertrophic cardiomyopathy noted on echo, would not want to be overly aggressive with diuretic.  Echo did suggest significant LV hypertrophy and there was recommendation for cardiac MRI.  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   Would consider converting from labetalol to carvedilol, and potentially ACE inhibitor to ARB which would potentially allow converting to ARNI if indicated.  Also continue amlodipine. I do think potentially spironolactone plus or minus chlorthalidone would also be beneficial.  No medication adjustments were made because she is just now been started on her medications I would like to see what happens after she has been adequately treated for 24 hours.  Will defer to the rounding team over the weekend for additional management.  Would consider outpatient cardiac MRI which would also help clarify possible wall motion abnormality seen on the echocardiogram that was not clearly evident.   I spent about 1520 minutes talking to the patient and family about the findings and the potential of it being either hypertension related or poor potentially HOCM.   Dorris Carnes, MD

## 2021-03-27 NOTE — Progress Notes (Addendum)
Progress Note  Patient Name: Kim Werner Date of Encounter: 03/27/2021  [facility] Cardiologist:NEW Subjective   Breathing is OK  N o CP    Inpatient Medications    Scheduled Meds:  amLODipine  10 mg Oral Daily   aspirin  325 mg Oral Daily   enoxaparin (LOVENOX) injection  40 mg Subcutaneous Q24H   furosemide  40 mg Intravenous BID   insulin aspart  0-15 Units Subcutaneous TID AC & HS   insulin glargine-yfgn  10 Units Subcutaneous Daily   labetalol  200 mg Oral BID   lisinopril  20 mg Oral Daily   Continuous Infusions:  PRN Meds: acetaminophen, ALPRAZolam, ondansetron (ZOFRAN) IV   Vital Signs    Vitals:   03/27/21 0058 03/27/21 0351 03/27/21 0500 03/27/21 0755  BP: 129/72 139/76  109/69  Pulse: 93 94    Resp: Temp: 98.7 F (37.1 C) 98.8 F (37.1 C)  98.2 F (36.8 C)  TempSrc: Oral Oral  Oral  SpO2: 96% 97%  98%  Weight:   117.7 kg   Height:        Intake/Output Summary (Last 24 hours) at 03/27/2021 0814 Last data filed at 03/27/2021 0000 Gross per 24 hour  Intake 1970 ml  Output --  Net 1970 ml   Last 3 Weights 03/27/2021 03/26/2021  Weight (lbs) 259 lb 6.4 oz 262 lb 9.6 oz  Weight (kg) 117.663 kg 119.115 kg      Telemetry    SR  - Personally Reviewed  ECG     - Personally Reviewed  Physical Exam   GEN: No acute distress.   Neck: No JVD Cardiac: RRR, no murmurs Respiratory: Clear to auscultation bilaterally. GI: Soft, nontender, non-distended  MS: No edema; No deformity. Neuro:  Nonfocal  Psych: Normal affect   Labs    High Sensitivity Troponin:   Recent Labs  Lab 03/25/21 1426 03/25/21 1627 03/26/21 0131 03/26/21 0327  TROPONINIHS 29* 36* 33* 32*     Chemistry Recent Labs  Lab 03/25/21 1426 03/25/21 2103 03/26/21 1053 03/27/21 0139  NA 135 135 135 135  K 3.6 3.6 3.4* 3.1*  CL 99  --  96* 97*  CO2 25  --  26 26  GLUCOSE 312*  --  369* 260*  BUN 20  --  19 28*  CREATININE 1.03*  --  1.07*  1.14*  CALCIUM 9.4  --  9.0 8.8*  MG  --   --  1.7 2.1  PROT  --   --  7.4 6.5  ALBUMIN  --   --  3.6 3.3*  AST  --   --  16 22  ALT  --   --  20 25  ALKPHOS  --   --  85 71  BILITOT  --   --  0.4 0.3  GFRNONAA >60  --  >60 >60  ANIONGAP 11  --  13 12    Lipids  Recent Labs  Lab 03/27/21 0139  CHOL 215*  TRIG 85  HDL 49  LDLCALC 149*  CHOLHDL 4.4    Hematology Recent Labs  Lab 03/25/21 1426 03/25/21 2103 03/26/21 1053 03/27/21 0139  WBC 9.1  --  7.7 8.4  RBC 5.22*  --  5.46* 4.99   4.94  HGB 10.6* 13.3 11.1* 10.1*  HCT 36.9 39.0 37.8 35.1*  MCV 70.7*  --  69.2* 70.3*  MCH 20.3*  --  20.3* 20.2*  MCHC  28.7*  --  29.4* 28.8*  RDW 18.0*  --  18.6* 18.3*  PLT 420*  --  384 393   Thyroid No results for input(s): TSH, FREET4 in the last 168 hours.  BNP Recent Labs  Lab 03/25/21 2057  BNP 224.7*    DDimer No results for input(s): DDIMER in the last 168 hours.   Radiology    DG Chest 2 View  Result Date: 03/25/2021 CLINICAL DATA:  Shortness of breath EXAM: CHEST - 2 VIEW COMPARISON:  None. FINDINGS: The heart size and mediastinal contours are within normal limits. Low lung volumes with prominence of the bronchovascular markings. No focal consolidation or pleural effusion. The visualized skeletal structures are unremarkable. IMPRESSION: No active cardiopulmonary disease. Electronically Signed   By: Larose Hires D.O.   On: 03/25/2021 14:57   CT Angio Chest PE W/Cm &/Or Wo Cm  Result Date: 03/25/2021 CLINICAL DATA:  Shortness of breath and chest pain with inspiration. EXAM: CT ANGIOGRAPHY CHEST WITH CONTRAST TECHNIQUE: Multidetector CT imaging of the chest was performed using the standard protocol during bolus administration of intravenous contrast. Multiplanar CT image reconstructions and MIPs were obtained to evaluate the vascular anatomy. RADIATION DOSE REDUCTION: This exam was performed according to the departmental dose-optimization program which includes automated  exposure control, adjustment of the mA and/or kV according to patient size and/or use of iterative reconstruction technique. CONTRAST:  33mL OMNIPAQUE IOHEXOL 350 MG/ML SOLN COMPARISON:  None. FINDINGS: Cardiovascular: There is no evidence of aortic aneurysm. The subsegmental pulmonary arteries are limited in evaluation secondary to suboptimal opacification with intravenous contrast. No evidence of pulmonary embolism. Normal heart size. No pericardial effusion. Mediastinum/Nodes: No enlarged mediastinal, hilar, or axillary lymph nodes. Thyroid gland, trachea, and esophagus demonstrate no significant findings. Lungs/Pleura: Very mild linear atelectasis is seen within the inferior aspect of the left upper lobe. Very mild, hazy ground-glass appearance of the lung parenchyma is seen within the bilateral lower lobes. There is no evidence of a pleural effusion or pneumothorax. Upper Abdomen: No acute abnormality. Musculoskeletal: No chest wall abnormality. No acute or significant osseous findings. Review of the MIP images confirms the above findings. IMPRESSION: 1. No evidence of pulmonary embolism or acute cardiopulmonary disease. Electronically Signed   By: Aram Candela M.D.   On: 03/25/2021 22:45   ECHOCARDIOGRAM COMPLETE  Result Date: 03/26/2021    ECHOCARDIOGRAM REPORT   Patient Name:   Kim Werner Date of Exam: 03/26/2021 Medical Rec #:  [id number]                Height:       67.0 in Accession #:    [id number]               Weight:       262.6 lb Date of Birth:  [birth year]                BSA:          2.270 m Patient Age:    45 years                 BP:           174/114 mmHg Patient Gender: F                        HR:           94 bpm. Exam Location:  Inpatient Procedure: 2D Echo, Cardiac Doppler and Color Doppler Indications:  I50.31 CHF  History:        Patient has no prior history of Echocardiogram examinations.                 Risk Factors:Hypertension and Diabetes.  Sonographer:     Festus Barren Referring Phys: [id number] JAN A MANSY IMPRESSIONS  1. Left ventricular ejection fraction, by estimation, is 50%. The left ventricle has mildly decreased function. The left ventricle demonstrates regional wall motion abnormalities (see scoring diagram/findings for description). There is severe asymmetric  left ventricular hypertrophy. Left ventricular diastolic parameters are indeterminate. Elevated left atrial pressure. No systolic anterior motion of the mitral valve or LVOT obstruction.  2. Right ventricular systolic function is normal. The right ventricular size is normal. Tricuspid regurgitation signal is inadequate for assessing PA pressure.  3. A small pericardial effusion is present.  4. The mitral valve is normal in structure. No evidence of mitral valve regurgitation. No evidence of mitral stenosis.  5. The aortic valve was not well visualized. Aortic valve regurgitation is not visualized. Comparison(s): No prior Echocardiogram. Conclusion(s)/Recommendation(s): Maximal septal hypertrophy 26 mm. If there is no systemic disease process that would account for this hypertrophy, recommend outpatient CMR for futher charterization and to assist in evaluation for hypertrophic cardiomyopathy. FINDINGS  Left Ventricle: Left ventricular ejection fraction, by estimation, is 50%. The left ventricle has mildly decreased function. The left ventricle demonstrates regional wall motion abnormalities. The left ventricular internal cavity size was normal in size. There is severe asymmetric left ventricular hypertrophy. Left ventricular diastolic parameters are indeterminate. Elevated left atrial pressure.  LV Wall Scoring: The antero-lateral wall and apical lateral segment are hypokinetic. Right Ventricle: The right ventricular size is normal. No increase in right ventricular wall thickness. Right ventricular systolic function is normal. Tricuspid regurgitation signal is inadequate for assessing PA pressure. Left  Atrium: Left atrial size was normal in size. Right Atrium: Right atrial size was normal in size. Pericardium: A small pericardial effusion is present. Mitral Valve: The mitral valve is normal in structure. No evidence of mitral valve regurgitation. No evidence of mitral valve stenosis. Tricuspid Valve: The tricuspid valve is normal in structure. Tricuspid valve regurgitation is not demonstrated. No evidence of tricuspid stenosis. Aortic Valve: The aortic valve was not well visualized. Aortic valve regurgitation is not visualized. Pulmonic Valve: The pulmonic valve was not well visualized. Pulmonic valve regurgitation is not visualized. No evidence of pulmonic stenosis. Aorta: The aortic root and ascending aorta are structurally normal, with no evidence of dilitation. IAS/Shunts: No atrial level shunt detected by color flow Doppler.  LEFT VENTRICLE PLAX 2D LVIDd:         4.10 cm     Diastology LVIDs:         3.00 cm     LV e' medial:    7.62 cm/s LV PW:         1.50 cm     LV E/e' medial:  12.0 LV IVS:        1.80 cm     LV e' lateral:   5.87 cm/s LVOT diam:     1.70 cm     LV E/e' lateral: 15.5 LV SV:         39 LV SV Index:   17 LVOT Area:     2.27 cm  LV Volumes (MOD) LV vol d, MOD A2C: 68.8 ml LV vol d, MOD A4C: 98.3 ml LV vol s, MOD A2C: 29.7 ml LV vol s, MOD A4C: 57.5 ml  LV SV MOD A2C:     39.1 ml LV SV MOD A4C:     98.3 ml LV SV MOD BP:      45.8 ml RIGHT VENTRICLE RV S prime:     15.10 cm/s TAPSE (M-mode): 2.5 cm LEFT ATRIUM             Index        RIGHT ATRIUM           Index LA diam:        4.10 cm 1.81 cm/m   RA Area:     10.70 cm LA Vol (A2C):   84.2 ml 37.09 ml/m  RA Volume:   20.60 ml  9.07 ml/m LA Vol (A4C):   45.7 ml 20.13 ml/m LA Biplane Vol: 64.8 ml 28.54 ml/m  AORTIC VALVE             PULMONIC VALVE LVOT Vmax:   99.20 cm/s  PV Vmax:       0.60 m/s LVOT Vmean:  65.500 cm/s PV Vmean:      39.000 cm/s LVOT VTI:    0.172 m     PV VTI:        0.113 m                          PV Peak grad:  1.5  mmHg AORTA                    PV Mean grad:  1.0 mmHg Ao Root diam: 3.00 cm Ao Asc diam:  3.10 cm MITRAL VALVE MV Area (PHT): 3.42 cm    SHUNTS MV Decel Time: 222 msec    Systemic VTI:  0.17 m MV E velocity: 91.20 cm/s  Systemic Diam: 1.70 cm MV A velocity: 79.00 cm/s MV E/A ratio:  1.15 Riley Lam MD Electronically signed by Riley Lam MD Signature Date/Time: 03/26/2021/3:00:16 PM    Final     Cardiac Studies   eft ventricular ejection fraction, by estimation, is 50%. The left ventricle has mildly decreased function. The left ventricle demonstrates regional wall motion abnormalities (see scoring diagram/findings for description). There is severe asymmetric left ventricular hypertrophy. Left ventricular diastolic parameters are indeterminate. Elevated left atrial pressure. No systolic anterior motion of the mitral valve or LVOT obstruction. 1. Right ventricular systolic function is normal. The right ventricular size is normal. Tricuspid regurgitation signal is inadequate for assessing PA pressure. 2. 3. A small pericardial effusion is present. The mitral valve is normal in structure. No evidence of mitral valve regurgitation. No evidence of mitral stenosis. 4. 5. The aortic valve was not well visualized. Aortic valve regurgitation is not visualized.  Conclusion(s)/Recommendation(s): Maximal septal hypertrophy 26 mm. If  there is no systemic disease process that would account for this  hypertrophy, recommend outpatient CMR for futher charterization and to  assist in evaluation for hypertrophic  cardiomyopathy.   Patient Profile     46 y.o. female  with hx of uncontrolled HTN, DM, obesity   Adimtted 3/10 with CP and SOB   Assessment & Plan     Chest pain   patient denies   Trivial elevation of troponin  Flat  Most likely due to subendocardial strain from HTN (severe)   2   Hypertensive urgency  BP is significantly improved     Now low normal  She came in on  nothing (not taking 2.5 amlodipine )    I would  pull back on dosing a bit as I worry she will become hypotensive  Amlodipine was increased to 10     Switch lisinopril to losartan  50 mg      Keep on labetolol for now       Again, these may be switched as an outpt depending on MRI findings      I reviewed with patient's the importance of taking antihypertensive meds (long-term effects of uncontrolled hypertension on endorgan's).  She understands this.  I also spoke to her about possible hypertrophic cardiomyopathy and the importance of getting an MRI.  3.  Severe LVH.  May be secondary to uncontrolled hypertension.  Again recommend cardiac MRI as an outpatient  4 hypokalemia.  Would stop Lasix.  Again with the patient's anatomy she would not tolerate dehydration.  Replete potassium.  Follow as an outpatient.  Her potassium was normal on admit.  5.  Diabetes patient is on agents for glucose control.  She is sitting drinking a Gatorade.  Discussed the importance of no added sugar, eating regular whole food (unprocessed) limit of added sugar intake per day is 24 g for a woman.  If patient feels better today, can ambulate without becoming hypotensive I feel it is okay for discharge.  We will make sure she has a follow-up in our clinic For questions or updates, please contact [facility] Please consult [email or web address] for contact info under        Signed, Dietrich PatesPaula Teresha Hanks, MD  03/27/2021, 8:14 AM

## 2021-03-27 NOTE — Care Management Note (Signed)
Case Management Note ? ?Patient Details  ?Name: Kim Werner ?MRN: [id number] ?Date of Birth: 12/12/1975 ? ?Subjective/Objective:   Patient in need of PCP               ? ? ?Action/Plan:   Patient provided with phone number for [facility] participating providers that accept her insurance. ? ? ?Expected Discharge Date:  03/27/21               ?Expected Discharge Plan:  Home/Self Care ? ?In-House Referral:  NA ? ?Discharge planning Services  CM Consult ? ?Post Acute Care Choice:  NA ?Choice offered to:  NA ? ?DME Arranged:  N/A ?DME Agency:  NA ? ?HH Arranged:  NA ?HH Agency:  NA ? ?Status of Service:  Completed, signed off ? ?If discussed at Long Length of Stay Meetings, dates discussed:   ? ?Additional Comments: ? ?Darcy Cordner G., RN ?03/27/2021, 12:24 PM ? ?

## 2021-03-27 NOTE — Progress Notes (Signed)
Inpatient Diabetes Program Recommendations ? ?AACE/ADA: New Consensus Statement on Inpatient Glycemic Control  ? ?Target Ranges:  Prepandial:   less than 140 mg/dL ?     Peak postprandial:   less than 180 mg/dL (1-2 hours) ?     Critically ill patients:  140 - 180 mg/dL  ? ? Latest Reference Range & Units 03/27/21 07:53  ?Glucose-Capillary 70 - 99 mg/dL 601 (H)  ?(H): Data is abnormally high ? Latest Reference Range & Units 03/26/21 07:44 03/26/21 11:46 03/26/21 16:34 03/26/21 20:16  ?Glucose-Capillary 70 - 99 mg/dL 093 (H) 235 (H) 573 (H) 369 (H)  ?(H): Data is abnormally high ? ?Review of Glycemic Control ? ?Diabetes history: DM2 ?Outpatient Diabetes medications: Metformin 1000 mg BID ?Current orders for Inpatient glycemic control: Semglee 10 units daily, Novolog 0-15 units AC&HS ? ?Inpatient Diabetes Program Recommendations:   ? ?Insulin: Please consider increasing Semglee to 20 units daily. ? ?NOTE: Received page from Dr. Marland Mcalpine regarding discharging patient home on basal insulin. Plan will be to discharge patient home on Semglee pens 20 units daily and Metformin 1000 mg BID. Unsure what copay for Semglee will be. Called patient over the phone to discuss plan to discharge on Semglee 20 units daily and Metformin. Explained what Semglee is and how it works. Asked that patient take it consistently daily about the same time each day. Patient is agreeable to take insulin daily and is comfortable with self injecting insulin. Explained that I was not able to tell what her copay would be for the [facility]. Informed patient about Semglee savings card which she can search for online and it would decrease copay once she showed pharmacy the savings card on her phone (would need Rx BIN number). Reminded patient to get a battery for her glucometer and to call on Monday to get appointment with [facility] practice to establish care. Asked patient to take DM medications a prescribed and to be sure to take glucometer with her to follow  up appointments with providers so they can use the information to make adjustments with DM medications if needed. Also informed patient that TOC was not able to find out if Trulicity or Marcelline Deist (as taken in the past) was covered with current insurance or note. Encouraged patient to call [phone] number on insurance card and ask to speak with someone about pharmacy benefits to see if they are covered and then she can discuss with new PCP if appropriate to be started or not.  Patient verbalized understanding of information and appreciative of information discussed.  ? ?Thanks, ?Orlando Penner, RN, MSN, CDE ?Diabetes Coordinator ?Inpatient Diabetes Program ?[phone] (Team Pager from 8am to 5pm) ? ? ? ?

## 2021-03-27 NOTE — Hospital Course (Addendum)
The patient is a morbidly obese African-American female with a past medical history significant for but #2 diabetes mellitus type 2, hypertension and other comorbidities who presented to the ED with acute onset of worsening dyspnea and palpitations of the last week with associated orthopnea, proximal nocturnal dyspnea and dyspnea on exertion without any lower extremity edema.  She experienced chest pain yesterday and she graded 9 out of 10 in severity and described the pain as sharp pain with no nausea vomiting or diaphoresis.  She denied any radiation but upon arrival to the ED she is noted to have a significantly elevated blood pressure.  She denies any headaches or dizziness and when she came to the ED her blood pressure was 219/138 with a heart rate 101 otherwise she had normal vital signs.  CBC initially showed a hemoglobin/hematocrit of 10.6/36.9 and a slight thrombocytosis.  Urine pregnancy test was negative.  EKG showed sinus tachycardia with poor R wave progression and chest x-ray showed no acute cardiopulmonary disease.  In the ED she is given IV labetalol 12.5 mg and p.o. metoprolol and she was admitted to the cardiac unit.  Cardiology was consulted and felt that her symptoms were consistent with heart failure with preserved ejection fraction and uncontrolled hypertension.  They resumed her home medications of labetalol 200 mg twice daily and amlodipine 10 mg p.o. daily and started lisinopril 20 mg daily.  A TTE was ordered and she was diuresed.  Cardiology evaluated ? ?**Patient's breathing improved and she had no chest pain.  Echocardiogram was reviewed and she had an EF of 50% with mildly decreased left ventricular function which demonstrated regional wall motion abnormalities and severe asymmetric left ventricular hypertrophy.  Her chest pain was improved and resolved and the cardiologist felt it was secondary to subendocardial strain from hypertension.  She was placed on amlodipine 2.5 mg p.o. daily  amlodipine 10, and losartan 50 mg as well as labetalol.  Cardiology recommended outpatient cardiac MRI which is being arranged.  Blood sugar was uncontrolled however diabetes education coronary consulted and recommended discharging on 20 units of Semglee in addition to the metformin.  She has been deemed medically stable to be discharged home at this time and follow-up with her PCP and cardiologist in outpatient setting. ?

## 2021-03-27 NOTE — Discharge Summary (Signed)
Physician Discharge Summary   Patient: Kim Werner MRN: [id number] DOB: [birth year]  Admit date:     03/25/2021  Discharge date: 03/27/21  Discharge Physician: Raiford Noble, DO   PCP: Pcp, No   Recommendations at discharge:   Follow-up and establish with PCP within 1 to 2 weeks and have them repeat a CBC, CMP, mag, Phos Follow-up with cardiology within 1 to 2 weeks  Discharge Diagnoses: Principal Problem:   Hypertensive urgency Active Problems:   Chest pain   Acute on chronic diastolic CHF (congestive heart failure) (HCC)   Type 2 diabetes mellitus without complications (HCC)   Acute diastolic (congestive) heart failure ([place])  Resolved Problems:   * No resolved hospital problems. [facility] Course: The patient is a morbidly obese African-American female with a past medical history significant for but #2 diabetes mellitus type 2, hypertension and other comorbidities who presented to the ED with acute onset of worsening dyspnea and palpitations of the last week with associated orthopnea, proximal nocturnal dyspnea and dyspnea on exertion without any lower extremity edema.  She experienced chest pain yesterday and she graded 9 out of 10 in severity and described the pain as sharp pain with no nausea vomiting or diaphoresis.  She denied any radiation but upon arrival to the ED she is noted to have a significantly elevated blood pressure.  She denies any headaches or dizziness and when she came to the ED her blood pressure was 219/138 with a heart rate 101 otherwise she had normal vital signs.  CBC initially showed a hemoglobin/hematocrit of 10.6/36.9 and a slight thrombocytosis.  Urine pregnancy test was negative.  EKG showed sinus tachycardia with poor R wave progression and chest x-ray showed no acute cardiopulmonary disease.  In the ED she is given IV labetalol 12.5 mg and p.o. metoprolol and she was admitted to the cardiac unit.  Cardiology was consulted and felt that her  symptoms were consistent with heart failure with preserved ejection fraction and uncontrolled hypertension.  They resumed her home medications of labetalol 200 mg twice daily and amlodipine 10 mg p.o. daily and started lisinopril 20 mg daily.  A TTE was ordered and she was diuresed.  Cardiology evaluated  **Patient's breathing improved and she had no chest pain.  Echocardiogram was reviewed and she had an EF of 50% with mildly decreased left ventricular function which demonstrated regional wall motion abnormalities and severe asymmetric left ventricular hypertrophy.  Her chest pain was improved and resolved and the cardiologist felt it was secondary to subendocardial strain from hypertension.  She was placed on amlodipine 2.5 mg p.o. daily amlodipine 10, and losartan 50 mg as well as labetalol.  Cardiology recommended outpatient cardiac MRI which is being arranged.  Blood sugar was uncontrolled however diabetes education coronary consulted and recommended discharging on 20 units of Semglee in addition to the metformin.  She has been deemed medically stable to be discharged home at this time and follow-up with her PCP and cardiologist in outpatient setting.  Assessment and Plan: Hypertensive urgency - The patient will be admitted to a cardiac telemetry observation bed however she meets inpatient criteria  - We will place on as needed IV labetalol but this is being discontinued  - We will continue antihypertensives. - We will add Nitropaste with this was can discontinue - Anxiety will be managed. -Continue to Monitor BP per Protocol  -Cardiology adjusted her medications as above and she is deemed stable for discharge   Chest pain, improved -  Just minimally elevated troponin I. - We will need to rule out ACS. 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 Per Cardiology If there is no systemic disease process that  could account for this hypertrophy, recommend outpatient CMR for futher charterization and to assist in evaluation for hypertrophic cardiomyopathy.  - Cardiology consult will be obtained. - Dr. Sidney Ace discussed the case with Dr. Renella Cunas. - She will be diuresed with IV Lasix and this was stopped. - Strict I's and O's and Daily weights  -Continue to [place] for S/Sx of Volume Overload  -Cardiology felt that her anatomy would not tolerate dehydration and recommended outpatient MRI   Uncontrolled Type 2 diabetes mellitus without complications ([place]) - The patient will be placed on supplement coverage with NovoLog but will order Semglee 10 units q24h and titrated is up to 20 units daily -Continue to Monitor CBGs closely  -Will need Insulin at D/C and will send home on 20 units of Semglee daily as well as metformin -Diabetes Education coordinator consulted   Hyperlipidemia -Lipid panel done and showed a total cholesterol/HDL ratio 4.4, cholesterol level of 215, HDL 49, LDL 149, triglycerides of 85, VLDL of 17   Hypokalemia -Mild at 3.4 yesterday and was 3.1 today and will replete prior to discharge -Mag Level was 1.7 and improved to 2.1 -Replete -Continue to Monitor and Replete as Necessary -Repeat CMP and Mag Level in the AM    Microcytic Anemia -Patient's Hgb/Hct went from 10.6/36.9 -> 13.3/39.0 -> 11.1/37.8 with an MCV of  69.2 -Anemia panel was checked and showed a iron level of 38, UIBC 344, TIBC of 382, saturation ratios of 10%, ferritin level 21, folate level 9.5, vitamin B12 703 -Continue to Monitor for S/Sx of Bleeding; No over bleeding noted -Repeat CBC in the AM    Morbid Obesity -Complicates overall prognosis and care -Estimated body mass index is 41.13 kg/m as calculated from the following:   Height as of this encounter: 5' 7"  (1.702 m).   Weight as of this encounter: 119.1 kg.  -Weight Loss and Dietary Counseling given  Consultants: Cardiology Procedures performed: ECHO  Disposition: Home Diet recommendation:  Discharge Diet Orders (From admission, onward)     Start     Ordered   03/27/21 0000  Diet - low sodium heart healthy        03/27/21 1137   03/27/21 0000  Diet Carb Modified        03/27/21 1137           Cardiac and Carb modified diet DISCHARGE MEDICATION: Allergies as of 03/27/2021   No Known Allergies      Medication List     TAKE these medications    amLODipine 2.5 MG tablet Commonly known as: NORVASC Take 1 tablet (2.5 mg total) by mouth daily.   blood glucose meter kit and supplies Kit Dispense  based on patient and insurance preference. Use up to four times daily as directed.   cholecalciferol 25 MCG (1000 UNIT) tablet Commonly known as: VITAMIN D3 Take 1,000 Units by mouth daily.   ferrous sulfate 325 (65 FE) MG tablet Take 325 mg by mouth every other day.   insulin glargine-yfgn 100 UNIT/ML Pen Commonly known as: Semglee (yfgn) Inject 20 Units into the skin daily.   Insulin Pen Needle 31G X 5 MM Misc 1 Container by Does not apply route daily.   labetalol 200 MG tablet Commonly known as: NORMODYNE Take 1 tablet (200 mg total) by mouth 2 (two) times daily.   losartan 50 MG tablet Commonly known as: COZAAR Take 1 tablet (50 mg total) by mouth daily.   metFORMIN 1000 MG tablet Commonly known as: GLUCOPHAGE Take 1,000 mg by mouth 2 (two) times  daily with a meal.        Follow-up Information     [place]. Go to .   Specialty: Emergency Medicine Why: If symptoms worsen Contact information: [address] [id number] [place]        Fay Records, MD. Call.   Specialty: Cardiology Why: Follow up within 1-2 weeks and will need outpatient Cardiac MRI Contact information: [address] [place] [address] [phone]         [place]. Call.   Why: Call [phone] for participating  [facility] that accept your insurance for a PCP Contact information: [place]               Discharge Exam: Danley Danker Weights   03/26/21 0426 03/27/21 0500  Weight: 119.1 kg 117.7 kg   Vitals:   03/27/21 0755 03/27/21 0918  BP: 109/69 109/69  Pulse:  (!) 102  Resp: 17   Temp: 98.2 F (36.8 C)   SpO2: 98%    Examination: Physical Exam:  Constitutional: WN/WD morbidly obese African-American female currently no acute distress Respiratory: Diminished to auscultation bilaterally, no wheezing, rales, rhonchi or crackles. Normal respiratory effort and patient is not tachypenic. No accessory muscle use.  Unlabored breathing Cardiovascular: RRR, no murmurs / rubs / gallops. S1 and S2 auscultated.  Trace extremity edema Abdomen: Soft, non-tender, distended secondary body habitus. No masses palpated. No appreciable hepatosplenomegaly. Bowel sounds positive.  GU: Deferred. Musculoskeletal: No clubbing / cyanosis of digits/nails. No joint deformity upper and lower extremities.  Skin: No rashes, lesions, ulcers. No induration; Warm and dry.  Neurologic: CN 2-12 grossly intact with no focal deficits.   Condition at discharge: stable  The results of significant diagnostics from this hospitalization (including imaging, microbiology, ancillary and laboratory) are listed  below for reference.   Imaging Studies: DG Chest 2 View  Result Date: 03/25/2021 CLINICAL DATA:  Shortness of breath EXAM: CHEST - 2 VIEW COMPARISON:  None. FINDINGS: The heart size and mediastinal contours are within normal limits. Low lung volumes with prominence of the bronchovascular markings. No focal consolidation or pleural effusion. The visualized skeletal structures are unremarkable. IMPRESSION: No active cardiopulmonary disease. Electronically Signed   By: Keane Police D.O.   On: 03/25/2021 14:57   CT Angio Chest PE W/Cm &/Or Wo Cm  Result Date: 03/25/2021 CLINICAL DATA:  Shortness of breath and chest pain with inspiration. EXAM: CT ANGIOGRAPHY CHEST WITH CONTRAST TECHNIQUE: Multidetector CT imaging of the chest was performed using the standard protocol during bolus administration of  intravenous contrast. Multiplanar CT image reconstructions and MIPs were obtained to evaluate the vascular anatomy. RADIATION DOSE REDUCTION: This exam was performed according to the departmental dose-optimization program which includes automated exposure control, adjustment of the mA and/or kV according to patient size and/or use of iterative reconstruction technique. CONTRAST:  5m OMNIPAQUE IOHEXOL 350 MG/ML SOLN COMPARISON:  None. FINDINGS: Cardiovascular: There is no evidence of aortic aneurysm. The subsegmental pulmonary arteries are limited in evaluation secondary to suboptimal opacification with intravenous contrast. No evidence of pulmonary embolism. Normal heart size. No pericardial effusion. Mediastinum/Nodes: No enlarged mediastinal, hilar, or axillary lymph nodes. Thyroid gland, trachea, and esophagus demonstrate no significant findings. Lungs/Pleura: Very mild linear atelectasis is seen within the inferior aspect of the left upper lobe. Very mild, hazy ground-glass appearance of the lung parenchyma is seen within the bilateral lower lobes. There is no evidence of a pleural effusion or pneumothorax. Upper  Abdomen: No acute abnormality. Musculoskeletal: No chest wall abnormality. No acute or significant osseous findings. Review of the MIP images confirms the above findings. IMPRESSION: 1. No evidence of pulmonary embolism or acute cardiopulmonary disease. Electronically Signed   By: TVirgina NorfolkM.D.   On: 03/25/2021 22:45   DG CHEST PORT 1 VIEW  Result Date: 03/27/2021 CLINICAL DATA:  Shortness of breath.  Hypertensive urgency. EXAM: PORTABLE CHEST 1 VIEW COMPARISON:  Chest radiographs and CTA 03/25/2021 FINDINGS: The cardiac silhouette remains mildly enlarged. There is mild central pulmonary vascular congestion without overt edema. No airspace consolidation, sizeable pleural effusion, or pneumothorax is identified. There is mild elevation of the right hemidiaphragm. IMPRESSION: Cardiomegaly and mild pulmonary vascular congestion. Electronically Signed   By: ALogan BoresM.D.   On: 03/27/2021 11:43   ECHOCARDIOGRAM COMPLETE  Result Date: 03/26/2021    ECHOCARDIOGRAM REPORT   Patient Name:   MGAVIN FAIVREDate of Exam: 03/26/2021 Medical Rec #:  [id number]               Height:       67.0 in Accession #:    [id number]              Weight:       262.6 lb Date of Birth:  8March 23, 1977               BSA:          2.270 m Patient Age:    89years                 BP:           174/114 mmHg Patient Gender: F                        HR:           94 bpm. Exam Location:  Inpatient Procedure: 2D Echo, Cardiac Doppler and Color Doppler Indications:    I50.31 CHF  History:        Patient has no prior history of Echocardiogram examinations.                 Risk Factors:Hypertension and Diabetes.  Sonographer:    JBeryle BeamsReferring Phys: [id number] A MBruno 1. Left ventricular ejection fraction, by estimation, is 50%. The left ventricle has mildly decreased function. The left ventricle demonstrates regional wall motion abnormalities (see scoring diagram/findings for description). There is  severe asymmetric  left ventricular hypertrophy. Left ventricular diastolic parameters are indeterminate.  Elevated left atrial pressure. No systolic anterior motion of the mitral valve or LVOT obstruction.  2. Right ventricular systolic function is normal. The right ventricular size is normal. Tricuspid regurgitation signal is inadequate for assessing PA pressure.  3. A small pericardial effusion is present.  4. The mitral valve is normal in structure. No evidence of mitral valve regurgitation. No evidence of mitral stenosis.  5. The aortic valve was not well visualized. Aortic valve regurgitation is not visualized. Comparison(s): No prior Echocardiogram. Conclusion(s)/Recommendation(s): Maximal septal hypertrophy 26 mm. If there is no systemic disease process that would account for this hypertrophy, recommend outpatient CMR for futher charterization and to assist in evaluation for hypertrophic cardiomyopathy. FINDINGS  Left Ventricle: Left ventricular ejection fraction, by estimation, is 50%. The left ventricle has mildly decreased function. The left ventricle demonstrates regional wall motion abnormalities. The left ventricular internal cavity size was normal in size. There is severe asymmetric left ventricular hypertrophy. Left ventricular diastolic parameters are indeterminate. Elevated left atrial pressure.  LV Wall Scoring: The antero-lateral wall and apical lateral segment are hypokinetic. Right Ventricle: The right ventricular size is normal. No increase in right ventricular wall thickness. Right ventricular systolic function is normal. Tricuspid regurgitation signal is inadequate for assessing PA pressure. Left Atrium: Left atrial size was normal in size. Right Atrium: Right atrial size was normal in size. Pericardium: A small pericardial effusion is present. Mitral Valve: The mitral valve is normal in structure. No evidence of mitral valve regurgitation. No evidence of mitral valve stenosis. Tricuspid  Valve: The tricuspid valve is normal in structure. Tricuspid valve regurgitation is not demonstrated. No evidence of tricuspid stenosis. Aortic Valve: The aortic valve was not well visualized. Aortic valve regurgitation is not visualized. Pulmonic Valve: The pulmonic valve was not well visualized. Pulmonic valve regurgitation is not visualized. No evidence of pulmonic stenosis. Aorta: The aortic root and ascending aorta are structurally normal, with no evidence of dilitation. IAS/Shunts: No atrial level shunt detected by color flow Doppler.  LEFT VENTRICLE PLAX 2D LVIDd:         4.10 cm     Diastology LVIDs:         3.00 cm     LV e' medial:    7.62 cm/s LV PW:         1.50 cm     LV E/e' medial:  12.0 LV IVS:        1.80 cm     LV e' lateral:   5.87 cm/s LVOT diam:     1.70 cm     LV E/e' lateral: 15.5 LV SV:         39 LV SV Index:   17 LVOT Area:     2.27 cm  LV Volumes (MOD) LV vol d, MOD A2C: 68.8 ml LV vol d, MOD A4C: 98.3 ml LV vol s, MOD A2C: 29.7 ml LV vol s, MOD A4C: 57.5 ml LV SV MOD A2C:     39.1 ml LV SV MOD A4C:     98.3 ml LV SV MOD BP:      45.8 ml RIGHT VENTRICLE RV S prime:     15.10 cm/s TAPSE (M-mode): 2.5 cm LEFT ATRIUM             Index        RIGHT ATRIUM           Index LA diam:        4.10 cm 1.81 cm/m   RA Area:  10.70 cm LA Vol (A2C):   84.2 ml 37.09 ml/m  RA Volume:   20.60 ml  9.07 ml/m LA Vol (A4C):   45.7 ml 20.13 ml/m LA Biplane Vol: 64.8 ml 28.54 ml/m  AORTIC VALVE             PULMONIC VALVE LVOT Vmax:   99.20 cm/s  PV Vmax:       0.60 m/s LVOT Vmean:  65.500 cm/s PV Vmean:      39.000 cm/s LVOT VTI:    0.172 m     PV VTI:        0.113 m                          PV Peak grad:  1.5 mmHg AORTA                    PV Mean grad:  1.0 mmHg Ao Root diam: 3.00 cm Ao Asc diam:  3.10 cm MITRAL VALVE MV Area (PHT): 3.42 cm    SHUNTS MV Decel Time: 222 msec    Systemic VTI:  0.17 m MV E velocity: 91.20 cm/s  Systemic Diam: 1.70 cm MV A velocity: 79.00 cm/s MV E/A ratio:  1.15 Rudean Haskell MD Electronically signed by Rudean Haskell MD Signature Date/Time: 03/26/2021/3:00:16 PM    Final     Microbiology: Results for orders placed or performed during the hospital encounter of 03/25/21  Resp Panel by RT-PCR (Flu A&B, Covid) Nasopharyngeal Swab     Status: None   Collection Time: 03/26/21  2:46 AM   Specimen: Nasopharyngeal Swab; Nasopharyngeal(NP) swabs in vial transport medium  Result Value Ref Range Status   SARS Coronavirus 2 by RT PCR NEGATIVE NEGATIVE Final    Comment: (NOTE) SARS-CoV-2 target nucleic acids are NOT DETECTED.  The SARS-CoV-2 RNA is generally detectable in upper respiratory specimens during the acute phase of infection. The lowest concentration of SARS-CoV-2 viral copies this assay can detect is 138 copies/mL. A negative result does not preclude SARS-Cov-2 infection and should not be used as the sole basis for treatment or other patient management decisions. A negative result may occur with  improper specimen collection/handling, submission of specimen other than nasopharyngeal swab, presence of viral mutation(s) within the areas targeted by this assay, and inadequate number of viral copies(<138 copies/mL). A negative result must be combined with clinical observations, patient history, and epidemiological information. The expected result is Negative.  Fact Sheet for Patients:  EntrepreneurPulse.com.au  Fact Sheet for Healthcare Providers:  IncredibleEmployment.be  This test is no t yet approved or cleared by the [place] FDA and  has been authorized for detection and/or diagnosis of SARS-CoV-2 by FDA under an Emergency Use Authorization (EUA). This EUA will remain  in effect (meaning this test can be used) for the duration of the COVID-19 declaration under Section 564(b)(1) of the Act, 21 U.S.C.section 360bbb-3(b)(1), unless the authorization is terminated  or revoked sooner.        Influenza A by PCR NEGATIVE NEGATIVE Final   Influenza B by PCR NEGATIVE NEGATIVE Final    Comment: (NOTE) The Xpert Xpress SARS-CoV-2/FLU/RSV plus assay is intended as an aid in the diagnosis of influenza from Nasopharyngeal swab specimens and should not be used as a sole basis for treatment. Nasal washings and aspirates are unacceptable for Xpert Xpress SARS-CoV-2/FLU/RSV testing.  Fact Sheet for Patients: EntrepreneurPulse.com.au  Fact Sheet for Healthcare Providers: IncredibleEmployment.be  This test  is not yet approved or cleared by the [place] and has been authorized for detection and/or diagnosis of SARS-CoV-2 by FDA under an Emergency Use Authorization (EUA). This EUA will remain in effect (meaning this test can be used) for the duration of the COVID-19 declaration under Section 564(b)(1) of the Act, 21 U.S.C. section 360bbb-3(b)(1), unless the authorization is terminated or revoked.  Performed at [facility], [place] [address] [place], [place] [address]     Labs: CBC: Recent Labs  Lab 03/25/21 1426 03/25/21 2103 03/26/21 1053 03/27/21 0139  WBC 9.1  --  7.7 8.4  NEUTROABS  --   --  6.0 6.1  HGB 10.6* 13.3 11.1* 10.1*  HCT 36.9 39.0 37.8 35.1*  MCV 70.7*  --  69.2* 70.3*  PLT 420*  --  384 694   Basic Metabolic Panel: Recent Labs  Lab 03/25/21 1426 03/25/21 2103 03/26/21 1053 03/27/21 0139  NA 135 135 135 135  K 3.6 3.6 3.4* 3.1*  CL 99  --  96* 97*  CO2 25  --  26 26  GLUCOSE 312*  --  369* 260*  BUN 20  --  19 28*  CREATININE 1.03*  --  1.07* 1.14*  CALCIUM 9.4  --  9.0 8.8*  MG  --   --  1.7 2.1  PHOS  --   --  3.6 4.0   Liver Function Tests: Recent Labs  Lab 03/26/21 1053 03/27/21 0139  AST 16 22  ALT 20 25  ALKPHOS 85 71  BILITOT 0.4 0.3  PROT 7.4 6.5  ALBUMIN 3.6 3.3*   CBG: Recent Labs  Lab 03/26/21 1146 03/26/21 1634 03/26/21 2016 03/27/21 0753 03/27/21 1149  GLUCAP 314*  272* 369* 303* 357*    Discharge time spent: greater than 30 minutes.  Signed: Raiford Noble, DO [facility] 03/29/2021

## 2021-03-28 ENCOUNTER — Other Ambulatory Visit: Payer: Self-pay | Admitting: Medical

## 2021-03-28 DIAGNOSIS — I119 Hypertensive heart disease without heart failure: Secondary | ICD-10-CM

## 2021-03-28 NOTE — Progress Notes (Signed)
? ?  Outpatient cardiac MR requested to complete work up for hypertrophic cardiomyopathy. TOC visit also requested for close follow-up.  ? ?Beatriz Stallion, PA-C ?03/28/21; 8:11 AM ? ?

## 2021-03-29 ENCOUNTER — Telehealth: Payer: Self-pay | Admitting: Cardiology

## 2021-03-29 NOTE — Telephone Encounter (Signed)
Patient contacted regarding discharge from [facility] on 03/27/2021. ? ?Patient understands to follow up with provider Caron Presume on 04/07/21 at 8:25 AM at [facility]. ?Patient understands discharge instructions? Yes ?Patient understands medications and regiment? Yes ?Patient understands to bring all medications to this visit? Yes ? ? ?

## 2021-03-29 NOTE — Telephone Encounter (Signed)
TOC scheduled for 04/07/21 at 8:25am with Juanda Crumble, PA per Judy Pimple, PA ?

## 2021-04-03 ENCOUNTER — Ambulatory Visit (HOSPITAL_COMMUNITY)
Admission: EM | Admit: 2021-04-03 | Discharge: 2021-04-03 | Disposition: A | Discharge status: Home / Self Care | Payer: BC Managed Care – PPO | Attending: Emergency Medicine | Admitting: Emergency Medicine

## 2021-04-03 ENCOUNTER — Encounter (HOSPITAL_COMMUNITY): Payer: Self-pay | Admitting: Emergency Medicine

## 2021-04-03 DIAGNOSIS — R109 Unspecified abdominal pain: Secondary | ICD-10-CM | POA: Insufficient documentation

## 2021-04-03 LAB — POCT URINALYSIS DIPSTICK, ED / UC
Bilirubin Urine: NEGATIVE
Glucose, UA: NEGATIVE mg/dL
Hgb urine dipstick: NEGATIVE
Ketones, ur: NEGATIVE mg/dL
Leukocytes,Ua: NEGATIVE
Nitrite: NEGATIVE
Protein, ur: 30 mg/dL — AB
Specific Gravity, Urine: >=1.03 (ref 1.005–1.030)
Urobilinogen, UA: 1 mg/dL (ref 0.0–1.0)
pH: 5 (ref 5.0–8.0)

## 2021-04-03 MED ORDER — ACETAMINOPHEN 500 MG PO TABS: 500.0000 mg | ORAL_TABLET | Freq: Four times a day (QID) | ORAL | 0 refills | Status: DC | PRN

## 2021-04-03 NOTE — ED Triage Notes (Signed)
Pt reports that she was in hospital last week for 4-5 days and discharged last Thursday. Pt reports right flank pain since Monday when started taking pills. Reports pain getting more intense. Denies urinary or bowel problems but when had pain similar years ago had bladder infection.  ?

## 2021-04-03 NOTE — Discharge Instructions (Addendum)
Urine culture sent.  We will call you with the results.   ?Push fluids and get plenty of rest.   ?Tylenol as prescribed for pain ?Follow up with PCP if symptoms persists ?Return here or go to ER if you have any new or worsening symptoms such as fever, worsening abdominal pain, nausea/vomiting, flank pain, etc... ?

## 2021-04-03 NOTE — ED Provider Notes (Signed)
?[place] ? ? ?Chief Complaint  ?Patient presents with  ? Flank Pain  ? ? ?SUBJECTIVE: ? ?Kim Werner is a 46 y.o. female who presented to the urgent care for complaint of right flank pain that started this past Monday.  Reportedly symptoms started when she started a new medication from hospital.  Localizes the pain to the right lower flank.  Pain is pain is intermittent and described as achy.  Has tried OTC medications without relief.  Symptoms are made worse with urination.  Admits to similar symptoms in the past.  Denies fever, chills, nausea, vomiting, abdominal pain,  abnormal vaginal discharge or bleeding, hematuria.   ? ?LMP: Patient's last menstrual period was 03/18/2021. ? ?ROS: As in HPI.  All other pertinent ROS negative.    ? ?Past Medical History:  ?Diagnosis Date  ? Diabetes mellitus without complication ([place])   ? Hypertension   ? ?History reviewed. No pertinent surgical history. ?No Known Allergies ?No current facility-administered medications on file prior to encounter.  ? ?Current Outpatient Medications on File Prior to Encounter  ?Medication Sig Dispense Refill  ? amLODipine (NORVASC) 2.5 MG tablet Take 1 tablet (2.5 mg total) by mouth daily. 30 tablet 0  ? blood glucose meter kit and supplies KIT Dispense based on patient and insurance preference. Use up to four times daily as directed. 1 each 0  ? cholecalciferol (VITAMIN D3) 25 MCG (1000 UNIT) tablet Take 1,000 Units by mouth daily.    ? ferrous sulfate 325 (65 FE) MG tablet Take 325 mg by mouth every other day.    ? insulin glargine-yfgn (SEMGLEE, YFGN,) 100 UNIT/ML Pen Inject 20 Units into the skin daily. 15 mL 0  ? Insulin Pen Needle 31G X 5 MM MISC 1 Container by Does not apply route daily. 100 each 0  ? labetalol (NORMODYNE) 200 MG tablet Take 1 tablet (200 mg total) by mouth 2 (two) times daily. 60 tablet 0  ? losartan (COZAAR) 50 MG tablet Take 1 tablet (50 mg total) by mouth daily. 30 tablet 0  ? metFORMIN  (GLUCOPHAGE) 1000 MG tablet Take 1,000 mg by mouth 2 (two) times daily with a meal.    ? ?Social History  ? ?Socioeconomic History  ? Marital status: Married  ?  Spouse name: Not on file  ? Number of children: Not on file  ? Years of education: Not on file  ? Highest education level: Not on file  ?Occupational History  ? Not on file  ?Tobacco Use  ? Smoking status: Never  ? Smokeless tobacco: Never  ?Vaping Use  ? Vaping Use: Never used  ?Substance and Sexual Activity  ? Alcohol use: Never  ? Drug use: Never  ? Sexual activity: Yes  ?Other Topics Concern  ? Not on file  ?Social History Narrative  ? Not on file  ? ?Social Determinants of Health  ? ?Financial Resource Strain: Not on file  ?Food Insecurity: Not on file  ?Transportation Needs: Not on file  ?Physical Activity: Not on file  ?Stress: Not on file  ?Social Connections: Not on file  ?Intimate Partner Violence: Not on file  ? ?History reviewed. No pertinent family history. ? ?OBJECTIVE: ? ?Vitals:  ? 04/03/21 1536  ?BP: (!) 170/108  ?Pulse: 92  ?Resp: 18  ?Temp: 98.2 ?F (36.8 ?C)  ?TempSrc: Oral  ?SpO2: 97%  ? ?Physical Exam ?Vitals and nursing note reviewed.  ?Constitutional:   ?   General: She is not in acute  distress. ?   Appearance: Normal appearance. She is normal weight. She is not ill-appearing, toxic-appearing or diaphoretic.  ?HENT:  ?   Head: Normocephalic.  ?Cardiovascular:  ?   Rate and Rhythm: Normal rate and regular rhythm.  ?   Pulses: Normal pulses.  ?   Heart sounds: Normal heart sounds. No murmur heard. ?  No friction rub. No gallop.  ?Pulmonary:  ?   Effort: Pulmonary effort is normal. No respiratory distress.  ?   Breath sounds: Normal breath sounds. No stridor. No wheezing, rhonchi or rales.  ?Chest:  ?   Chest wall: No tenderness.  ?Abdominal:  ?   Tenderness: There is no right CVA tenderness or left CVA tenderness.  ?Musculoskeletal:  ?   Lumbar back: Tenderness present.  ?Neurological:  ?   Mental Status: She is alert and oriented to  person, place, and time.  ?  ? ?Labs Reviewed  ?POCT URINALYSIS DIPSTICK, ED / UC - Abnormal; Notable for the following components:  ?    Result Value  ? Protein, ur 30 (*)   ? All other components within normal limits  ?URINE CULTURE  ? ? ?ASSESSMENT & PLAN: ? ?1. Right flank pain   ? ? ?Meds ordered this encounter  ?Medications  ? acetaminophen (TYLENOL) 500 MG tablet  ?  Sig: Take 1 tablet (500 mg total) by mouth every 6 (six) hours as needed.  ?  Dispense:  30 tablet  ?  Refill:  0  ? ? ?Discharge Instructions ? ?Urine culture sent.  We will call you with the results.   ?Push fluids and get plenty of rest.   ?Tylenol as prescribed for pain ?Follow up with PCP if symptoms persists ?Return here or go to ER if you have any new or worsening symptoms such as fever, worsening abdominal pain, nausea/vomiting, flank pain, etc... ? ?Outlined signs and symptoms indicating need for more acute intervention. ?Patient verbalized understanding. ?After Visit Summary given. ? ?  ?  ?Emerson Monte, FNP ?04/03/21 1614 ? ?

## 2021-04-04 ENCOUNTER — Emergency Department (HOSPITAL_COMMUNITY): Payer: BC Managed Care – PPO

## 2021-04-04 ENCOUNTER — Encounter (HOSPITAL_COMMUNITY): Payer: Self-pay | Admitting: Emergency Medicine

## 2021-04-04 ENCOUNTER — Other Ambulatory Visit: Payer: Self-pay

## 2021-04-04 ENCOUNTER — Emergency Department (HOSPITAL_COMMUNITY)
Admission: EM | Admit: 2021-04-04 | Discharge: 2021-04-04 | Disposition: A | Discharge status: Home / Self Care | Payer: BC Managed Care – PPO | Attending: Emergency Medicine | Admitting: Emergency Medicine

## 2021-04-04 DIAGNOSIS — I509 Heart failure, unspecified: Secondary | ICD-10-CM | POA: Diagnosis not present

## 2021-04-04 DIAGNOSIS — M545 Low back pain, unspecified: Secondary | ICD-10-CM

## 2021-04-04 DIAGNOSIS — I110 Hypertensive heart disease with heart failure: Secondary | ICD-10-CM | POA: Insufficient documentation

## 2021-04-04 DIAGNOSIS — E119 Type 2 diabetes mellitus without complications: Secondary | ICD-10-CM | POA: Diagnosis not present

## 2021-04-04 DIAGNOSIS — R109 Unspecified abdominal pain: Secondary | ICD-10-CM | POA: Insufficient documentation

## 2021-04-04 DIAGNOSIS — Z794 Long term (current) use of insulin: Secondary | ICD-10-CM | POA: Insufficient documentation

## 2021-04-04 DIAGNOSIS — Z79899 Other long term (current) drug therapy: Secondary | ICD-10-CM | POA: Diagnosis not present

## 2021-04-04 DIAGNOSIS — Z7984 Long term (current) use of oral hypoglycemic drugs: Secondary | ICD-10-CM | POA: Insufficient documentation

## 2021-04-04 LAB — URINALYSIS, ROUTINE W REFLEX MICROSCOPIC
Bacteria, UA: NONE SEEN
Bilirubin Urine: NEGATIVE
Glucose, UA: >=500 mg/dL — AB
Hgb urine dipstick: NEGATIVE
Ketones, ur: NEGATIVE mg/dL
Leukocytes,Ua: NEGATIVE
Nitrite: NEGATIVE
Protein, ur: 30 mg/dL — AB
Specific Gravity, Urine: 1.022 (ref 1.005–1.030)
pH: 5 (ref 5.0–8.0)

## 2021-04-04 LAB — CBC WITH DIFFERENTIAL/PLATELET
Abs Immature Granulocytes: 0.03 10*3/uL (ref 0.00–0.07)
Basophils Absolute: 0 10*3/uL (ref 0.0–0.1)
Basophils Relative: 0 %
Eosinophils Absolute: 0.1 10*3/uL (ref 0.0–0.5)
Eosinophils Relative: 1 %
HCT: 35.1 % — ABNORMAL LOW (ref 36.0–46.0)
Hemoglobin: 10 g/dL — ABNORMAL LOW (ref 12.0–15.0)
Immature Granulocytes: 0 %
Lymphocytes Relative: 13 %
Lymphs Abs: 1.2 10*3/uL (ref 0.7–4.0)
MCH: 20.4 pg — ABNORMAL LOW (ref 26.0–34.0)
MCHC: 28.5 g/dL — ABNORMAL LOW (ref 30.0–36.0)
MCV: 71.6 fL — ABNORMAL LOW (ref 80.0–100.0)
Monocytes Absolute: 0.6 10*3/uL (ref 0.1–1.0)
Monocytes Relative: 6 %
Neutro Abs: 7.3 10*3/uL (ref 1.7–7.7)
Neutrophils Relative %: 80 %
Platelets: 404 10*3/uL — ABNORMAL HIGH (ref 150–400)
RBC: 4.9 MIL/uL (ref 3.87–5.11)
RDW: 18.3 % — ABNORMAL HIGH (ref 11.5–15.5)
WBC: 9.2 10*3/uL (ref 4.0–10.5)
nRBC: 0 % (ref 0.0–0.2)

## 2021-04-04 LAB — COMPREHENSIVE METABOLIC PANEL
ALT: 20 U/L (ref 0–44)
AST: 16 U/L (ref 15–41)
Albumin: 3.4 g/dL — ABNORMAL LOW (ref 3.5–5.0)
Alkaline Phosphatase: 80 U/L (ref 38–126)
Anion gap: 8 (ref 5–15)
BUN: 11 mg/dL (ref 6–20)
CO2: 26 mmol/L (ref 22–32)
Calcium: 9.1 mg/dL (ref 8.9–10.3)
Chloride: 100 mmol/L (ref 98–111)
Creatinine, Ser: 0.83 mg/dL (ref 0.44–1.00)
GFR, Estimated: >60 mL/min (ref >60)
Glucose, Bld: 279 mg/dL — ABNORMAL HIGH (ref 70–99)
Potassium: 3.8 mmol/L (ref 3.5–5.1)
Sodium: 134 mmol/L — ABNORMAL LOW (ref 135–145)
Total Bilirubin: 0.5 mg/dL (ref 0.3–1.2)
Total Protein: 7 g/dL (ref 6.5–8.1)

## 2021-04-04 LAB — URINE CULTURE

## 2021-04-04 LAB — POC URINE PREG, ED: Preg Test, Ur: NEGATIVE

## 2021-04-04 MED ORDER — METHOCARBAMOL 500 MG PO TABS
500.0000 mg | ORAL_TABLET | Freq: Once | ORAL | Status: AC
  Administered 2021-04-04: 500 mg via ORAL
  Filled 2021-04-04: qty 1

## 2021-04-04 MED ORDER — IBUPROFEN 200 MG PO TABS
600.0000 mg | ORAL_TABLET | Freq: Once | ORAL | Status: AC
  Administered 2021-04-04: 600 mg via ORAL
  Filled 2021-04-04: qty 1

## 2021-04-04 MED ORDER — IBUPROFEN 600 MG PO TABS: 600.0000 mg | ORAL_TABLET | Freq: Three times a day (TID) | ORAL | 0 refills | Status: DC | PRN

## 2021-04-04 MED ORDER — METHOCARBAMOL 500 MG PO TABS: 500.0000 mg | ORAL_TABLET | Freq: Two times a day (BID) | ORAL | 0 refills | Status: DC | PRN

## 2021-04-04 NOTE — ED Provider Notes (Signed)
?[facility] ?Provider Note ? ? ?CSN: [id number] ?Arrival date & time: 04/04/21  1021 ? ?  ? ?History ? ?Chief Complaint  ?Patient presents with  ? Flank Pain  ? ? ?Kim Werner is a 46 y.o. female. ? ?Patient with history of CHFF, diabetes, and hypertension presents today with complaint of right sided flank pain. States that same has been bothering her since she was discharged from the hospital on 3/11 after several days of admission for CHF exacerbation and hypertensive urgency. She states that the pain is constant but is worse when she moves. She describes it as achy in nature, it does not move or radiate. Denies any other associated symptoms including fevers, chills, nausea, vomiting, diarrhea, abdominal pain, hematuria, dysuria, hematochezia, or melena.  Originally presented to urgent care yesterday for same where she had urinalysis which was unremarkable. She was then discharged and told to take Tylenol. She states that Tylenol is not helping her pain which is worsening.  States that she felt similar pain several years ago and had a UTI. Denies any vaginal discharge or bleeding. LMP was 3/02 and was normal for her. ? ?The history is provided by the patient. No language interpreter was used.  ?Flank Pain ?Pertinent negatives include no chest pain, no abdominal pain and no shortness of breath.  ? ?  ? ?Home Medications ?Prior to Admission medications   ?Medication Sig Start Date End Date Taking? Authorizing Provider  ?acetaminophen (TYLENOL) 500 MG tablet Take 1 tablet (500 mg total) by mouth every 6 (six) hours as needed. 04/03/21   Avegno, Darrelyn Hillock, FNP  ?amLODipine (NORVASC) 2.5 MG tablet Take 1 tablet (2.5 mg total) by mouth daily. 03/27/21   Raiford Noble Latif, DO  ?blood glucose meter kit and supplies KIT Dispense based on patient and insurance preference. Use up to four times daily as directed. 03/27/21   Raiford Noble Latif, DO  ?cholecalciferol (VITAMIN D3) 25  MCG (1000 UNIT) tablet Take 1,000 Units by mouth daily.    [provider]  ?ferrous sulfate 325 (65 FE) MG tablet Take 325 mg by mouth every other day.    [provider]  ?insulin glargine-yfgn (SEMGLEE, YFGN,) 100 UNIT/ML Pen Inject 20 Units into the skin daily. 03/27/21   Kerney Elbe, DO  ?Insulin Pen Needle 31G X 5 MM MISC 1 Container by Does not apply route daily. 03/27/21   Raiford Noble Latif, DO  ?labetalol (NORMODYNE) 200 MG tablet Take 1 tablet (200 mg total) by mouth 2 (two) times daily. 03/27/21 04/26/21  Raiford Noble Latif, DO  ?losartan (COZAAR) 50 MG tablet Take 1 tablet (50 mg total) by mouth daily. 03/28/21   Raiford Noble Latif, DO  ?metFORMIN (GLUCOPHAGE) 1000 MG tablet Take 1,000 mg by mouth 2 (two) times daily with a meal.    [provider]  ?   ? ?Allergies    ?Patient has no known allergies.   ? ?Review of Systems   ?Review of Systems  ?Constitutional:  Negative for chills and fever.  ?Respiratory:  Negative for cough and shortness of breath.   ?Cardiovascular:  Negative for chest pain.  ?Gastrointestinal:  Negative for abdominal pain, diarrhea, nausea and vomiting.  ?Genitourinary:  Positive for flank pain. Negative for difficulty urinating, dysuria, hematuria, menstrual problem, vaginal bleeding, vaginal discharge and vaginal pain.  ?All other systems reviewed and are negative. ? ?Physical Exam ?Updated Vital Signs ?BP (!) 176/114   Pulse 81   Temp  98.6 ?F (37 ?C) (Oral)   Resp 18   LMP 03/18/2021   SpO2 96%  ?Physical Exam ?Vitals and nursing note reviewed.  ?Constitutional:   ?   General: She is not in acute distress. ?   Appearance: Normal appearance. She is normal weight. She is not ill-appearing, toxic-appearing or diaphoretic.  ?   Comments: Patient resting comfortably in bed in no acute distress  ?HENT:  ?   Head: Normocephalic and atraumatic.  ?Cardiovascular:  ?   Rate and Rhythm: Normal rate and regular rhythm.  ?   Heart sounds: Normal heart  sounds.  ?Pulmonary:  ?   Effort: Pulmonary effort is normal. No respiratory distress.  ?   Breath sounds: Normal breath sounds. No stridor. No wheezing, rhonchi or rales.  ?Abdominal:  ?   General: Abdomen is flat.  ?   Palpations: Abdomen is soft.  ?   Tenderness: There is no abdominal tenderness. There is no right CVA tenderness or left CVA tenderness.  ?Musculoskeletal:     ?   General: Normal range of motion.  ?   Cervical back: Normal range of motion.  ?Skin: ?   General: Skin is warm and dry.  ?Neurological:  ?   General: No focal deficit present.  ?   Mental Status: She is alert.  ?Psychiatric:     ?   Mood and Affect: Mood normal.     ?   Behavior: Behavior normal.  ? ? ?ED Results / Procedures / Treatments   ?Labs ?(all labs ordered are listed, but only abnormal results are displayed) ?Labs Reviewed  ?COMPREHENSIVE METABOLIC PANEL - Abnormal; Notable for the following components:  ?    Result Value  ? Sodium 134 (*)   ? Glucose, Bld 279 (*)   ? Albumin 3.4 (*)   ? All other components within normal limits  ?CBC WITH DIFFERENTIAL/PLATELET - Abnormal; Notable for the following components:  ? Hemoglobin 10.0 (*)   ? HCT 35.1 (*)   ? MCV 71.6 (*)   ? MCH 20.4 (*)   ? MCHC 28.5 (*)   ? RDW 18.3 (*)   ? Platelets 404 (*)   ? All other components within normal limits  ?URINALYSIS, ROUTINE W REFLEX MICROSCOPIC  ?POC URINE PREG, ED  ? ? ?EKG ?None ? ?Radiology ?No results found. ? ?Procedures ?Procedures  ? ? ?Medications Ordered in ED ?Medications - No data to display ? ?ED Course/ Medical Decision Making/ A&P ?Clinical Course as of 04/05/21 1204  ?Sun Apr 04, 2021  ?1544 IMPRESSION: ?1. No acute intra-abdominal process.  No urolithiasis. ?2. 2.6 cm slightly irregular nodule arising from the inferior right ?rectus abdominis muscle, nonspecific. Differential considerations ?include endometriosis if there is a history of prior C-section, as ?well as abdominal wall neoplasms such as desmoid. Recommend  further ?evaluation with ultrasound. ?3. Fat containing midline supraumbilical and left paraumbilical ?hernias. ?4. Probable cholelithiasis [MT]  ?1608 This is a 46 year old female here with right-sided flank pain ongoing for several days.  It is worse with movement, reproducible on exam with tenderness of the right paraspinal muscle.  She has no dysuria, no nausea vomiting or GI symptoms.  Work-up in the ED was fairly unremarkable, no leukocytosis, no AKI.  Her CT renal stone study did not note any acute ureteral stone, did note a possible small abdominal wall mass, and possible gallstones.  She has a negative Murphy sign and no GI symptoms suspect acute cholecystitis.  We did did  discuss this incidental findings.  I recommended Robaxin and ibuprofen for suspected musculoskeletal pain.  She will need to follow-up with a primary care provider [MT]  ?  ?Clinical Course User Index ?[MT] Wyvonnia Dusky, MD  ? ?                        ?Medical Decision Making ?Amount and/or Complexity of Data Reviewed ?Labs: ordered. ?Radiology: ordered. ? ?Risk ?Prescription drug management. ? ? ?This patient presents to the ED for concern of right sided flank pain, this involves an extensive number of treatment options, and is a complaint that carries with it a high risk of complications and morbidity. ? ? ?Co morbidities that complicate the patient evaluation ? ?Morbid obesity, CHF, hypertension, diabetes ? ? ?Additional history obtained: ? ?Additional history obtained from previous admission note ? ? ?Lab Tests: ? ?I Ordered, and personally interpreted labs.  The pertinent results include:  glucose 279, no leukocytosis, anemia per baseline ? ? ?Imaging Studies ordered: ? ?I ordered imaging studies including CT renal which is pending at shift change ? ? ? ?Patient presents today with right flank pain. UA and renal stone study pending at shift change.  ? ?Care handoff to Dr. Langston Masker at shift change. Please see their note for further  information and dispo. ? ? ?This is a shared visit with supervising physician Dr. Langston Masker who has independently evaluated patient & provided guidance in evaluation/management/disposition, in agreement with care  ? ? ?Final

## 2021-04-04 NOTE — ED Triage Notes (Signed)
Patient BIB [facility] from home for evaluation of right flank pain, seen multiple times for same. Patient reports pain has gotten since previous visits. Patient is alert, oriented, and in no apparent distress at this time. ? ?EMS Vitals ?BP 160/108 ?HR 86 ?RR 20 ?98% on room air ?

## 2021-04-06 NOTE — Progress Notes (Signed)
?Cardiology Office Note:   ? ?Date:  04/07/2021  ? ?ID:  TAJAE MAIOLO, DOB 11-12-1975, MRN [id number] ? ?PCP:  Pcp, No ?[facility] Cardiologist: Glenetta Hew, MD  ? ?Reason for visit: Hospital follow-up ? ?History of Present Illness:   ? ?Esmay JERAH ESTY is a 46 y.o. female with a hx of uncontrolled hypertension, diabetes, obesity.  She was seen in consult on March 26, 2021 for shortness of breath x1 week and 2 episodes of chest pain.  Blood pressure on arrival to the ED was 219/138.  ARB was added to her blood pressure regimen. ? ?Echo suggested significant LV hypertrophy, EF 32%, no systolic anterior motion of the mitral valve or LVOT obstruction, normal RV, no significant valve disease, regional wall motion abnormalities.   ? ?She works as [employer] and recently moved from [place]. ? ?Today, she states compliance with her medications.  Though, she did not take today she is fasting for possible blood work.  She states she has taken drastic steps to improve her life.  She is eating healthier.  She is walking around her complex for 35 to 40 minutes/day.  She has a 56 and 44 year old.   ? ?She denies chest pain, shortness of breath, PND, orthopnea, lower extreme edema, dizziness, palpitations and syncope.  She states she thinks she has sleep apnea.  She snores.  She has daytime fatigue and does not wake up refreshed. ? ?  ?Past Medical History:  ?Diagnosis Date  ? Diabetes mellitus without complication ([place])   ? Hypertension   ? ? ?No past surgical history on file. ? ?Current Medications: ?Current Meds  ?Medication Sig  ? acetaminophen (TYLENOL) 500 MG tablet Take 1 tablet (500 mg total) by mouth every 6 (six) hours as needed.  ? amLODipine (NORVASC) 2.5 MG tablet Take 1 tablet (2.5 mg total) by mouth daily.  ? atorvastatin (LIPITOR) 40 MG tablet Take 1 tablet (40 mg total) by mouth daily.  ? blood glucose meter kit and supplies KIT Dispense based on patient and insurance preference. Use up  to four times daily as directed.  ? cholecalciferol (VITAMIN D3) 25 MCG (1000 UNIT) tablet Take 1,000 Units by mouth daily.  ? ferrous sulfate 325 (65 FE) MG tablet Take 325 mg by mouth every other day.  ? ibuprofen (ADVIL) 600 MG tablet Take 1 tablet (600 mg total) by mouth every 8 (eight) hours as needed for up to 30 doses for moderate pain or mild pain. Take with food  ? insulin glargine-yfgn (SEMGLEE, YFGN,) 100 UNIT/ML Pen Inject 20 Units into the skin daily.  ? Insulin Pen Needle 31G X 5 MM MISC 1 Container by Does not apply route daily.  ? labetalol (NORMODYNE) 200 MG tablet Take 1 tablet (200 mg total) by mouth 2 (two) times daily.  ? losartan (COZAAR) 50 MG tablet Take 1 tablet (50 mg total) by mouth daily.  ? metFORMIN (GLUCOPHAGE) 1000 MG tablet Take 1,000 mg by mouth 2 (two) times daily with a meal.  ? methocarbamol (ROBAXIN) 500 MG tablet Take 1 tablet (500 mg total) by mouth 2 (two) times daily as needed for up to 20 doses for muscle spasms.  ?  ? ?Allergies:   Patient has no known allergies.  ? ?Social History  ? ?Socioeconomic History  ? Marital status: Married  ?  Spouse name: Not on file  ? Number of children: Not on file  ? Years of education: Not on file  ?  Highest education level: Not on file  ?Occupational History  ? Not on file  ?Tobacco Use  ? Smoking status: Never  ? Smokeless tobacco: Never  ?Vaping Use  ? Vaping Use: Never used  ?Substance and Sexual Activity  ? Alcohol use: Never  ? Drug use: Never  ? Sexual activity: Yes  ?Other Topics Concern  ? Not on file  ?Social History Narrative  ? Not on file  ? ?Social Determinants of Health  ? ?Financial Resource Strain: Not on file  ?Food Insecurity: Not on file  ?Transportation Needs: Not on file  ?Physical Activity: Not on file  ?Stress: Not on file  ?Social Connections: Not on file  ?  ? ?Family History: ?The patient's family history is not on file. ? ?ROS:   ?Please see the history of present illness.    ? ?EKGs/Labs/Other Studies Reviewed:    ? ?Recent Labs: ?03/25/2021: B Natriuretic Peptide 224.7 ?03/27/2021: Magnesium 2.1 ?04/04/2021: ALT 20; BUN 11; Creatinine, Ser 0.83; Hemoglobin 10.0; Platelets 404; Potassium 3.8; Sodium 134  ? ?Recent Lipid Panel ?Lab Results  ?Component Value Date/Time  ? CHOL 215 (H) 03/27/2021 01:39 AM  ? TRIG 85 03/27/2021 01:39 AM  ? HDL 49 03/27/2021 01:39 AM  ? LDLCALC 149 (H) 03/27/2021 01:39 AM  ? ? ?Physical Exam:   ? ?VS:  BP (!) 160/110   Pulse 94   Ht 5' 7"  (1.702 m)   Wt 268 lb 6.4 oz (121.7 kg)   LMP 03/18/2021   SpO2 98%   BMI 42.04 kg/m?    ?No data found. ? ?Wt Readings from Last 3 Encounters:  ?04/07/21 268 lb 6.4 oz (121.7 kg)  ?03/27/21 259 lb 6.4 oz (117.7 kg)  ?  ? ?GEN:  Well nourished, well developed in no acute distress, obese ?HEENT: Normal ?NECK: No JVD; No carotid bruits ?CARDIAC: RRR, no murmurs, rubs, gallops ?RESPIRATORY:  Clear to auscultation without rales, wheezing or rhonchi  ?ABDOMEN: Soft, non-tender, non-distended ?MUSCULOSKELETAL: No edema; No deformity  ?SKIN: Warm and dry ?NEUROLOGIC:  Alert and oriented ?PSYCHIATRIC:  Normal affect  ? ?  ?ASSESSMENT AND PLAN  ? ? ?Hypertensive hypertrophic cardiomyopathy with HFpEF ?- Echo 03/2021: Maximal septal hypertrophy 26 mm ?- Recommend outpatient CMR for futher charterization and to assist in evaluation for hypertrophic cardiomyopathy - scheduled 04/29/21 ? ?Hypertension, well controlled by home readings ?-She states her blood pressure is usually less than 130/80.  On her current regimen, her systolic blood pressure was 109 on hospital discharge. ?-Continue current medications. ?-Goal BP is <130/80.  Recommend DASH diet (high in vegetables, fruits, low-fat dairy products, whole grains, poultry, fish, and nuts and low in sweets, sugar-sweetened beverages, and red meats), salt restriction and increase physical activity. ? ?Hyperlipidemia with goal LDL less than 100 ?-total cholesterol/HDL ratio 4.4, cholesterol level of 215, HDL 49, LDL 149,  triglycerides of 85, VLDL of 17 ?-Start Lipitor 40 mg daily.  Check fasting lipids in 3 months. ?-Discussed cholesterol lowering diets - Mediterranean diet, DASH diet, vegetarian diet, low-carbohydrate diet and avoidance of trans fats.  Discussed healthier choice substitutes.  Nuts, high-fiber foods, and fiber supplements may also improve lipids.   ? ?Obesity ?-Discussed how even a 5-10% weight loss can have cardiovascular benefits.   ?-Recommend moderate intensity activity for 30 minutes 5 days/week and the DASH diet. ?-She declines referral to nutrition/health coaching.  She has already made changes for healthier lifestyle. ? ?Diabetes mellitus ?-Hemoglobin A1c over 11%; started on insulin ?-She has a  follow-up with [facility] PCP ? ?Suspected sleep apnea ?-Risk factors include morbid obesity, hypertension, daytime fatigue, snoring ?-Order sleep study ? ?Disposition - Follow-up in 6 months with Dr. Ellyn Hack. ? ? ?Medication Adjustments/Labs and Tests Ordered: ?Current medicines are reviewed at length with the patient today.  Concerns regarding medicines are outlined above.  ?Orders Placed This Encounter  ?Procedures  ? Lipid panel  ? Nocturnal polysomnography  ? ?Meds ordered this encounter  ?Medications  ? atorvastatin (LIPITOR) 40 MG tablet  ?  Sig: Take 1 tablet (40 mg total) by mouth daily.  ?  Dispense:  90 tablet  ?  Refill:  3  ? ? ?Patient Instructions  ?Medication Instructions:  ?Start Lipitor 40 mg ( Take 1 Tablet Daily). ?*If you need a refill on your cardiac medications before your next appointment, please call your pharmacy* ? ? ?Lab Work: ?Lipid Panel . 3 months ?If you have labs (blood work) drawn today and your tests are completely normal, you will receive your results only by: ?MyChart Message (if you have MyChart) OR ?A paper copy in the mail ?If you have any lab test that is abnormal or we need to change your treatment, we will call you to review the results. ? ? ?Testing/Procedures: [facility], [address] ?Your physician has recommended that you have a sleep study. This test records several body functions during sleep, including: brain activity, eye movement, oxygen and car

## 2021-04-07 ENCOUNTER — Other Ambulatory Visit: Payer: Self-pay

## 2021-04-07 ENCOUNTER — Ambulatory Visit (INDEPENDENT_AMBULATORY_CARE_PROVIDER_SITE_OTHER): Payer: BC Managed Care – PPO | Admitting: Physician Assistant

## 2021-04-07 ENCOUNTER — Other Ambulatory Visit: Payer: Self-pay | Admitting: Physician Assistant

## 2021-04-07 ENCOUNTER — Encounter: Payer: Self-pay | Admitting: Physician Assistant

## 2021-04-07 VITALS — BP 160/110 | HR 94 | Ht 67.0 in | Wt 268.4 lb

## 2021-04-07 DIAGNOSIS — I422 Other hypertrophic cardiomyopathy: Secondary | ICD-10-CM | POA: Diagnosis not present

## 2021-04-07 DIAGNOSIS — I10 Essential (primary) hypertension: Secondary | ICD-10-CM

## 2021-04-07 DIAGNOSIS — E785 Hyperlipidemia, unspecified: Secondary | ICD-10-CM | POA: Diagnosis not present

## 2021-04-07 DIAGNOSIS — I119 Hypertensive heart disease without heart failure: Secondary | ICD-10-CM | POA: Diagnosis not present

## 2021-04-07 DIAGNOSIS — R0683 Snoring: Secondary | ICD-10-CM

## 2021-04-07 DIAGNOSIS — R400 Somnolence: Secondary | ICD-10-CM

## 2021-04-07 MED ORDER — ATORVASTATIN CALCIUM 40 MG PO TABS: 40.0000 mg | ORAL_TABLET | Freq: Every day | ORAL | 3 refills | Status: DC

## 2021-04-07 NOTE — Patient Instructions (Signed)
Medication Instructions:  ?Start Lipitor 40 mg ( Take 1 Tablet Daily). ?*If you need a refill on your cardiac medications before your next appointment, please call your pharmacy* ? ? ?Lab Work: ?Lipid Panel . 3 months ?If you have labs (blood work) drawn today and your tests are completely normal, you will receive your results only by: ?MyChart Message (if you have MyChart) OR ?A paper copy in the mail ?If you have any lab test that is abnormal or we need to change your treatment, we will call you to review the results. ? ? ?Testing/Procedures: [facility], [address] ?Your physician has recommended that you have a sleep study. This test records several body functions during sleep, including: brain activity, eye movement, oxygen and carbon dioxide blood levels, heart rate and rhythm, breathing rate and rhythm, the flow of air through your mouth and nose, snoring, body muscle movements, and chest and belly movement.  ? ? ?Follow-Up: ?At [facility], you and your health needs are our priority.  As part of our continuing mission to provide you with exceptional heart care, we have created designated Provider Care Teams.  These Care Teams include your primary Cardiologist (physician) and Advanced Practice Providers (APPs -  Physician Assistants and Nurse Practitioners) who all work together to provide you with the care you need, when you need it. ? ?We recommend signing up for the patient portal called "MyChart".  Sign up information is provided on this After Visit Summary.  MyChart is used to connect with patients for Virtual Visits (Telemedicine).  Patients are able to view lab/test results, encounter notes, upcoming appointments, etc.  Non-urgent messages can be sent to your provider as well.   ?To learn more about what you can do with MyChart, go to [email or web address].   ? ?Your next appointment:   ?6 month(s) ? ?The format for your next appointment:   ?In Person ? ?Provider:    ?Bryan Lemma, MD   ? ?  ?

## 2021-04-08 ENCOUNTER — Telehealth: Payer: Self-pay | Admitting: *Deleted

## 2021-04-08 NOTE — Telephone Encounter (Signed)
Left sleep study appointment details on voicemail along with [facility] contact information. ?

## 2021-04-28 ENCOUNTER — Telehealth (HOSPITAL_COMMUNITY): Payer: Self-pay | Admitting: Emergency Medicine

## 2021-04-28 NOTE — Telephone Encounter (Signed)
Patient forgot appt, now on vacation out of town. Will r/s ? ?Rockwell Alexandria RN Navigator Cardiac Imaging ?[facility] ?[phone] Office  ?[phone] Cell ? ?

## 2021-04-29 ENCOUNTER — Ambulatory Visit (HOSPITAL_COMMUNITY): Payer: BC Managed Care – PPO

## 2021-05-13 ENCOUNTER — Ambulatory Visit (HOSPITAL_BASED_OUTPATIENT_CLINIC_OR_DEPARTMENT_OTHER): Payer: BC Managed Care – PPO | Admitting: Cardiovascular Disease

## 2021-06-16 ENCOUNTER — Encounter (HOSPITAL_BASED_OUTPATIENT_CLINIC_OR_DEPARTMENT_OTHER): Payer: BC Managed Care – PPO | Admitting: Cardiovascular Disease

## 2021-08-05 ENCOUNTER — Inpatient Hospital Stay (HOSPITAL_COMMUNITY)
Admission: EM | Admit: 2021-08-05 | Discharge: 2021-08-06 | DRG: 304 | Disposition: A | Discharge status: Home / Self Care | Payer: BC Managed Care – PPO | Attending: Student in an Organized Health Care Education/Training Program | Admitting: Student in an Organized Health Care Education/Training Program

## 2021-08-05 ENCOUNTER — Inpatient Hospital Stay (HOSPITAL_COMMUNITY): Payer: BC Managed Care – PPO

## 2021-08-05 ENCOUNTER — Encounter (HOSPITAL_COMMUNITY): Payer: Self-pay

## 2021-08-05 ENCOUNTER — Emergency Department (HOSPITAL_COMMUNITY): Payer: BC Managed Care – PPO

## 2021-08-05 DIAGNOSIS — I248 Other forms of acute ischemic heart disease: Secondary | ICD-10-CM | POA: Diagnosis present

## 2021-08-05 DIAGNOSIS — I5033 Acute on chronic diastolic (congestive) heart failure: Secondary | ICD-10-CM | POA: Diagnosis present

## 2021-08-05 DIAGNOSIS — Z6841 Body Mass Index (BMI) 40.0 and over, adult: Secondary | ICD-10-CM | POA: Diagnosis not present

## 2021-08-05 DIAGNOSIS — Z7984 Long term (current) use of oral hypoglycemic drugs: Secondary | ICD-10-CM

## 2021-08-05 DIAGNOSIS — D649 Anemia, unspecified: Secondary | ICD-10-CM | POA: Diagnosis present

## 2021-08-05 DIAGNOSIS — I422 Other hypertrophic cardiomyopathy: Secondary | ICD-10-CM | POA: Diagnosis present

## 2021-08-05 DIAGNOSIS — J9601 Acute respiratory failure with hypoxia: Secondary | ICD-10-CM | POA: Diagnosis present

## 2021-08-05 DIAGNOSIS — Z79899 Other long term (current) drug therapy: Secondary | ICD-10-CM

## 2021-08-05 DIAGNOSIS — T465X6A Underdosing of other antihypertensive drugs, initial encounter: Secondary | ICD-10-CM | POA: Diagnosis present

## 2021-08-05 DIAGNOSIS — Z794 Long term (current) use of insulin: Secondary | ICD-10-CM

## 2021-08-05 DIAGNOSIS — E785 Hyperlipidemia, unspecified: Secondary | ICD-10-CM | POA: Diagnosis present

## 2021-08-05 DIAGNOSIS — I110 Hypertensive heart disease with heart failure: Secondary | ICD-10-CM | POA: Diagnosis present

## 2021-08-05 DIAGNOSIS — E119 Type 2 diabetes mellitus without complications: Secondary | ICD-10-CM

## 2021-08-05 DIAGNOSIS — I161 Hypertensive emergency: Principal | ICD-10-CM | POA: Diagnosis present

## 2021-08-05 DIAGNOSIS — E1165 Type 2 diabetes mellitus with hyperglycemia: Secondary | ICD-10-CM | POA: Diagnosis present

## 2021-08-05 DIAGNOSIS — I5031 Acute diastolic (congestive) heart failure: Secondary | ICD-10-CM | POA: Diagnosis not present

## 2021-08-05 DIAGNOSIS — E1169 Type 2 diabetes mellitus with other specified complication: Secondary | ICD-10-CM

## 2021-08-05 DIAGNOSIS — E66813 Obesity, class 3: Secondary | ICD-10-CM | POA: Diagnosis present

## 2021-08-05 HISTORY — PX: TRANSTHORACIC ECHOCARDIOGRAM: SHX275

## 2021-08-05 LAB — CBC WITH DIFFERENTIAL/PLATELET
Abs Immature Granulocytes: 0.05 10*3/uL (ref 0.00–0.07)
Basophils Absolute: 0.1 10*3/uL (ref 0.0–0.1)
Basophils Relative: 1 %
Eosinophils Absolute: 0.2 10*3/uL (ref 0.0–0.5)
Eosinophils Relative: 2 %
HCT: 38.5 % (ref 36.0–46.0)
Hemoglobin: 11.4 g/dL — ABNORMAL LOW (ref 12.0–15.0)
Immature Granulocytes: 1 %
Lymphocytes Relative: 10 %
Lymphs Abs: 1.1 10*3/uL (ref 0.7–4.0)
MCH: 20.3 pg — ABNORMAL LOW (ref 26.0–34.0)
MCHC: 29.6 g/dL — ABNORMAL LOW (ref 30.0–36.0)
MCV: 68.6 fL — ABNORMAL LOW (ref 80.0–100.0)
Monocytes Absolute: 0.5 10*3/uL (ref 0.1–1.0)
Monocytes Relative: 5 %
Neutro Abs: 8.7 10*3/uL — ABNORMAL HIGH (ref 1.7–7.7)
Neutrophils Relative %: 81 %
Platelets: 357 10*3/uL (ref 150–400)
RBC: 5.61 MIL/uL — ABNORMAL HIGH (ref 3.87–5.11)
RDW: 18.8 % — ABNORMAL HIGH (ref 11.5–15.5)
WBC: 10.5 10*3/uL (ref 4.0–10.5)
nRBC: 0 % (ref 0.0–0.2)

## 2021-08-05 LAB — TROPONIN I (HIGH SENSITIVITY)
Troponin I (High Sensitivity): 41 ng/L — ABNORMAL HIGH (ref <18)
Troponin I (High Sensitivity): 39 ng/L — ABNORMAL HIGH (ref <18)

## 2021-08-05 LAB — COMPREHENSIVE METABOLIC PANEL
ALT: 31 U/L (ref 0–44)
AST: 31 U/L (ref 15–41)
Albumin: 3.5 g/dL (ref 3.5–5.0)
Alkaline Phosphatase: 87 U/L (ref 38–126)
Anion gap: 12 (ref 5–15)
BUN: 19 mg/dL (ref 6–20)
CO2: 23 mmol/L (ref 22–32)
Calcium: 9.5 mg/dL (ref 8.9–10.3)
Chloride: 101 mmol/L (ref 98–111)
Creatinine, Ser: 1.18 mg/dL — ABNORMAL HIGH (ref 0.44–1.00)
GFR, Estimated: 59 mL/min — ABNORMAL LOW (ref >60)
Glucose, Bld: 395 mg/dL — ABNORMAL HIGH (ref 70–99)
Potassium: 3.9 mmol/L (ref 3.5–5.1)
Sodium: 136 mmol/L (ref 135–145)
Total Bilirubin: 0.6 mg/dL (ref 0.3–1.2)
Total Protein: 7.6 g/dL (ref 6.5–8.1)

## 2021-08-05 LAB — ECHOCARDIOGRAM COMPLETE
Height: 67 in
S' Lateral: 3.5 cm
Weight: 4444.47 oz

## 2021-08-05 LAB — BRAIN NATRIURETIC PEPTIDE: B Natriuretic Peptide: 135.6 pg/mL — ABNORMAL HIGH (ref 0.0–100.0)

## 2021-08-05 LAB — GLUCOSE, CAPILLARY
Glucose-Capillary: 467 mg/dL — ABNORMAL HIGH (ref 70–99)
Glucose-Capillary: 441 mg/dL — ABNORMAL HIGH (ref 70–99)
Glucose-Capillary: 406 mg/dL — ABNORMAL HIGH (ref 70–99)

## 2021-08-05 MED ORDER — ACETAMINOPHEN 325 MG PO TABS
650.0000 mg | ORAL_TABLET | ORAL | Status: DC | PRN
  Administered 2021-08-06: 650 mg via ORAL
  Filled 2021-08-05: qty 2

## 2021-08-05 MED ORDER — SODIUM CHLORIDE 0.9 % IV SOLN
250.0000 mL | INTRAVENOUS | Status: DC | PRN
Start: 2021-08-05 — End: 2021-08-06

## 2021-08-05 MED ORDER — LOSARTAN POTASSIUM 50 MG PO TABS
50.0000 mg | ORAL_TABLET | Freq: Every day | ORAL | Status: DC
  Administered 2021-08-05 – 2021-08-06 (×2): 50 mg via ORAL
  Filled 2021-08-05 (×2): qty 1

## 2021-08-05 MED ORDER — RIVAROXABAN 10 MG PO TABS
10.0000 mg | ORAL_TABLET | Freq: Every day | ORAL | Status: DC
  Administered 2021-08-06: 10 mg via ORAL
  Filled 2021-08-05: qty 1

## 2021-08-05 MED ORDER — INSULIN ASPART 100 UNIT/ML IJ SOLN
20.0000 [IU] | Freq: Once | INTRAMUSCULAR | Status: AC
  Administered 2021-08-05: 20 [IU] via SUBCUTANEOUS

## 2021-08-05 MED ORDER — SODIUM CHLORIDE 0.9% FLUSH
3.0000 mL | INTRAVENOUS | Status: DC | PRN
Start: 2021-08-05 — End: 2021-08-06

## 2021-08-05 MED ORDER — INSULIN GLARGINE-YFGN 100 UNIT/ML ~~LOC~~ SOLN: 10.0000 [IU] | Freq: Once | SUBCUTANEOUS | Status: DC

## 2021-08-05 MED ORDER — CARVEDILOL 3.125 MG PO TABS
3.1250 mg | ORAL_TABLET | Freq: Two times a day (BID) | ORAL | Status: DC
Start: 2021-08-05 — End: 2021-08-05
  Administered 2021-08-05: 3.125 mg via ORAL
  Filled 2021-08-05: qty 1

## 2021-08-05 MED ORDER — INSULIN ASPART 100 UNIT/ML IJ SOLN
25.0000 [IU] | Freq: Once | INTRAMUSCULAR | Status: AC
Start: 2021-08-05 — End: 2021-08-05
  Administered 2021-08-05: 25 [IU] via SUBCUTANEOUS

## 2021-08-05 MED ORDER — FUROSEMIDE 10 MG/ML IJ SOLN
80.0000 mg | Freq: Once | INTRAMUSCULAR | Status: AC
  Administered 2021-08-05: 80 mg via INTRAVENOUS
  Filled 2021-08-05: qty 8

## 2021-08-05 MED ORDER — INSULIN ASPART 100 UNIT/ML IJ SOLN
5.0000 [IU] | Freq: Three times a day (TID) | INTRAMUSCULAR | Status: DC
  Administered 2021-08-06 (×3): 5 [IU] via SUBCUTANEOUS

## 2021-08-05 MED ORDER — INSULIN ASPART 100 UNIT/ML IJ SOLN
0.0000 [IU] | Freq: Three times a day (TID) | INTRAMUSCULAR | Status: DC
  Administered 2021-08-06: 20 [IU] via SUBCUTANEOUS
  Administered 2021-08-06: 11 [IU] via SUBCUTANEOUS
  Administered 2021-08-06: 15 [IU] via SUBCUTANEOUS
  Administered 2021-08-06: 4 [IU] via SUBCUTANEOUS

## 2021-08-05 MED ORDER — NITROGLYCERIN IN D5W 200-5 MCG/ML-% IV SOLN
0.0000 ug/min | INTRAVENOUS | Status: DC
  Administered 2021-08-05: 100 ug/min via INTRAVENOUS
  Filled 2021-08-05 (×2): qty 250

## 2021-08-05 MED ORDER — INSULIN GLARGINE-YFGN 100 UNIT/ML ~~LOC~~ SOLN
10.0000 [IU] | Freq: Every day | SUBCUTANEOUS | Status: DC
  Administered 2021-08-05 – 2021-08-06 (×2): 10 [IU] via SUBCUTANEOUS
  Filled 2021-08-05 (×2): qty 0.1

## 2021-08-05 MED ORDER — SODIUM CHLORIDE 0.9% FLUSH
3.0000 mL | Freq: Two times a day (BID) | INTRAVENOUS | Status: DC
Start: 2021-08-05 — End: 2021-08-06
  Administered 2021-08-05 (×2): 3 mL via INTRAVENOUS

## 2021-08-05 MED ORDER — ATORVASTATIN CALCIUM 40 MG PO TABS
40.0000 mg | ORAL_TABLET | Freq: Every day | ORAL | Status: DC
  Administered 2021-08-05 – 2021-08-06 (×2): 40 mg via ORAL
  Filled 2021-08-05 (×2): qty 1

## 2021-08-05 MED ORDER — CARVEDILOL 6.25 MG PO TABS
6.2500 mg | ORAL_TABLET | Freq: Two times a day (BID) | ORAL | Status: DC
Start: 2021-08-05 — End: 2021-08-06
  Administered 2021-08-05: 6.25 mg via ORAL
  Filled 2021-08-05: qty 1

## 2021-08-05 MED ORDER — ONDANSETRON HCL 4 MG/2ML IJ SOLN: 4.0000 mg | Freq: Four times a day (QID) | INTRAMUSCULAR | Status: DC | PRN

## 2021-08-05 MED ORDER — AMLODIPINE BESYLATE 10 MG PO TABS
10.0000 mg | ORAL_TABLET | Freq: Every day | ORAL | Status: DC
  Administered 2021-08-05 – 2021-08-06 (×2): 10 mg via ORAL
  Filled 2021-08-05 (×2): qty 1

## 2021-08-05 NOTE — Progress Notes (Signed)
  Transition of Care [facility]) Screening Note   Patient Details  Name: Kim Werner Date of Birth: 1975/03/02   Transition of Care [facility]) CM/SW Contact:    Delilah Shan, LCSWA Phone Number: 08/05/2021, 4:56 PM    Transition of Care Department [facility]) has reviewed patient and no TOC needs have been identified at this time. We will continue to monitor patient advancement through interdisciplinary progression rounds. If new patient transition needs arise, please place a TOC consult.

## 2021-08-05 NOTE — ED Provider Notes (Signed)
[facility] EMERGENCY DEPARTMENT Provider Note   CSN: [id number] Arrival date & time: 08/05/21  7858     History  No chief complaint on file.   Kim Werner is a 46 y.o. female.  46 year old female with past medical history of hypertension not taking medications presents the ER today with acute respiratory failure.  Patient states that she is felt little short of breath last couple days with little nonproductive cough maybe little mucus but nothing malodorous or abnormal color.  No fevers.  No lower extremity swelling.  No abdominal distention.  Tonight in the [place] while laying down she had significant worsening dyspnea.  EMS was called.  She had labored breathing with a room air saturation of 80%.  She was hypertensive.  They gave her a dose of Solu-Medrol started on CPAP and brought here for further evaluation.  On arrival here patient states that her breathing is little bit better with the CPAP.  No smoking.  Does not take Lasix at home.  On review of the records she had an echocardiogram done in March when she was here that showed a mildly reduced EF of 50%.  But now denies any chest pain.        Home Medications Prior to Admission medications   Medication Sig Start Date End Date Taking? Authorizing Provider  acetaminophen (TYLENOL) 500 MG tablet Take 1 tablet (500 mg total) by mouth every 6 (six) hours as needed. 04/03/21   Avegno, Darrelyn Hillock, FNP  amLODipine (NORVASC) 2.5 MG tablet Take 1 tablet (2.5 mg total) by mouth daily. 03/27/21   Raiford Noble Latif, DO  atorvastatin (LIPITOR) 40 MG tablet Take 1 tablet (40 mg total) by mouth daily. 04/07/21 07/06/21  Warren Lacy, PA-C  blood glucose meter kit and supplies KIT Dispense based on patient and insurance preference. Use up to four times daily as directed. 03/27/21   Raiford Noble Latif, DO  cholecalciferol (VITAMIN D3) 25 MCG (1000 UNIT) tablet Take 1,000 Units by mouth daily.    [provider]  ferrous sulfate 325 (65 FE) MG tablet Take 325 mg by mouth every other day.    [provider]  ibuprofen (ADVIL) 600 MG tablet Take 1 tablet (600 mg total) by mouth every 8 (eight) hours as needed for up to 30 doses for moderate pain or mild pain. Take with food 04/04/21   Wyvonnia Dusky, MD  insulin glargine-yfgn (SEMGLEE, YFGN,) 100 UNIT/ML Pen Inject 20 Units into the skin daily. 03/27/21   Sheikh, Omair Latif, DO  Insulin Pen Needle 31G X 5 MM MISC 1 Container by Does not apply route daily. 03/27/21   Raiford Noble Latif, DO  labetalol (NORMODYNE) 200 MG tablet Take 1 tablet (200 mg total) by mouth 2 (two) times daily. 03/27/21 04/26/21  Raiford Noble Latif, DO  losartan (COZAAR) 50 MG tablet Take 1 tablet (50 mg total) by mouth daily. 03/28/21   Raiford Noble Latif, DO  metFORMIN (GLUCOPHAGE) 1000 MG tablet Take 1,000 mg by mouth 2 (two) times daily with a meal.    [provider]  methocarbamol (ROBAXIN) 500 MG tablet Take 1 tablet (500 mg total) by mouth 2 (two) times daily as needed for up to 20 doses for muscle spasms. 04/04/21   Wyvonnia Dusky, MD      Allergies    Patient has no known allergies.    Review of Systems   Review of Systems  Physical Exam Updated Vital Signs  BP (!) 202/134   Pulse (!) 121   Temp 98.2 F (36.8 C) (Axillary)   Resp (!) 34   Ht 5' 7"  (1.702 m)   Wt 126 kg   SpO2 100%   BMI 43.51 kg/m  Physical Exam Vitals and nursing note reviewed.  Constitutional:      General: She is in acute distress.     Appearance: She is well-developed. She is diaphoretic.  HENT:     Head: Normocephalic and atraumatic.     Mouth/Throat:     Mouth: Mucous membranes are moist.  Eyes:     Pupils: Pupils are equal, round, and reactive to light.  Cardiovascular:     Rate and Rhythm: Regular rhythm. Tachycardia present.  Pulmonary:     Effort: No respiratory distress.     Breath sounds: No stridor. Rales present.  Abdominal:      General: There is no distension.  Musculoskeletal:        General: No swelling or tenderness. Normal range of motion.     Cervical back: Normal range of motion.  Skin:    General: Skin is warm.  Neurological:     General: No focal deficit present.     Mental Status: She is alert.     ED Results / Procedures / Treatments   Labs (all labs ordered are listed, but only abnormal results are displayed) Labs Reviewed  CBC WITH DIFFERENTIAL/PLATELET  COMPREHENSIVE METABOLIC PANEL  BRAIN NATRIURETIC PEPTIDE  TROPONIN I (HIGH SENSITIVITY)    EKG EKG Interpretation  Date/Time:  Thursday August 05 2021 04:37:19 EDT Ventricular Rate:  115 PR Interval:  162 QRS Duration: 85 QT Interval:  327 QTC Calculation: 453 R Axis:   100 Text Interpretation: Sinus tachycardia LAE, consider biatrial enlargement Right axis deviation Low voltage, precordial leads Probable anteroseptal infarct, old Minimal ST depression Confirmed by Merrily Pew [phone]) on 08/05/2021 5:05:11 AM  Radiology DG Chest Portable 1 View  Result Date: 08/05/2021 CLINICAL DATA:  Evaluate for pulmonary edema EXAM: PORTABLE CHEST 1 VIEW COMPARISON:  03/27/2021 FINDINGS: Chronic cardiomegaly. Vascular pedicle widening and prominence of hilar vessels but no Kerley lines or effusion. No consolidation. IMPRESSION: Cardiomegaly and congested appearance of central vessels. Negative for edema or pneumonia. Electronically Signed   By: Jorje Guild M.D.   On: 08/05/2021 04:54    Procedures .Critical Care  Performed by: Merrily Pew, MD Authorized by: Merrily Pew, MD   Critical care provider statement:    Critical care time (minutes):  30   Critical care was necessary to treat or prevent imminent or life-threatening deterioration of the following conditions:  Respiratory failure   Critical care was time spent personally by me on the following activities:  Development of treatment plan with patient or surrogate, discussions with  consultants, evaluation of patient's response to treatment, examination of patient, ordering and review of laboratory studies, ordering and review of radiographic studies, ordering and performing treatments and interventions, pulse oximetry, re-evaluation of patient's condition and review of old charts     Medications Ordered in ED Medications  nitroGLYCERIN 50 mg in dextrose 5 % 250 mL (0.2 mg/mL) infusion (250 mcg/min Intravenous Rate/Dose Change 08/05/21 0506)  furosemide (LASIX) injection 80 mg (80 mg Intravenous Given 08/05/21 0456)    ED Course/ Medical Decision Making/ A&P                           Medical Decision Making Amount and/or Complexity of  Data Reviewed Labs: ordered. Radiology: ordered.  Risk Prescription drug management. Decision regarding hospitalization.  46 year old female here with acute hypoxic respiratory failure.  Hypertensive, tachypneic, tachycardic with mild Rales.  Chest x-ray viewed interpreted by myself as having some possible mild pulmonary edema near the hilum bilaterally that is slightly worse than most recent x-ray in the system.  Started on BiPAP.  Nitro drip started with aggressive titration to help with vasodilation.  Lasix ordered.  Pending troponin, BNP.  EKG without acute ischemic changes.  Will await labs and response to treatment to determine level of care needed.  Bipap removed. Pending reeval for admission. Still on NTG.   Final Clinical Impression(s) / ED Diagnoses Final diagnoses:  Hypertensive emergency    Rx / DC Orders ED Discharge Orders     None         Ikeem Cleckler, Corene Cornea, MD 08/06/21 [phone]

## 2021-08-05 NOTE — H&P (Addendum)
NAME:  Kim Werner, MRN:  [id number], DOB:  07-04-1975, LOS: 0 ADMISSION DATE:  08/05/2021, Primary: Pcp, No  CHIEF COMPLAINT:  dyspnea   Medical Service: Internal Medicine Teaching Service         Attending Physician: Dr. Evette Doffing, Mallie Mussel, *    First Contact: Dr. Jodell Cipro Pager: [phone]  Second Contact: Dr. Coy Saunas Pager: [phone]       After Hours (After 5p/  First Contact Pager: [phone]  weekends / holidays): Second Contact Pager: [phone]   HISTORY OF PRESENT ILLNESS   Kim Werner is 46yo person with heart failure with preserved ejection fraction 2/2 hypertensive hypertrophic cardiomyopathy, uncontrolled type II diabetes mellitus, hyperlipidemia, class III obesity presenting to [facility] early this morning with acute onset of dyspnea. Patient reports she has had gradually progressive dyspnea on exertion associated with chest tightness, orthopnea, increase in productive cough, and paroxysmal nocturnal dyspnea over the last few weeks. She does not take her medications regularly, as they often make her feel worse. Specifically mentions labetalol causes her blood pressure to drop to systolic 88'F and she becomes lightheaded. Last time she took labetalol was four days ago, last dose of amlodipine was last week. This morning, she suddenly woke up and couldn't breathe. After sitting up for a few minutes, her dyspnea had not resolved and EMS was called. She denies any chest pain during this event. Also denies any recent fevers, chills, nausea, vomiting, abdominal pain, constipation, diarrhea, dysuria, hematuria.   PCP: Pcp, No  EMS + ED COURSE   EMS arrived to patient's residence ~3:50AM and she was found to be significantly hypertensive to 257/165, hypoxic on room air at 84%, and tachycardic. Patient was placed on non-rebreather and switched to BiPAP upon arrival to ED. Initial work-up in ED revealed pulmonary vascular congestion on imaging, mild troponin increase, and elevated BNP  concerning for acute heart failure. With elevated BP, nitro gtt was initiated for hypertensive emergency and cardiology was consulted. IMTS was then consulted for admission.   PAST MEDICAL HISTORY   She,  has a past medical history of Diabetes mellitus without complication ([place]) and Hypertension.   HOME MEDICATIONS   Prior to Admission medications   Medication Sig Start Date End Date Taking? Authorizing Provider  acetaminophen (TYLENOL) 500 MG tablet Take 1 tablet (500 mg total) by mouth every 6 (six) hours as needed. 04/03/21   Avegno, Darrelyn Hillock, FNP  amLODipine (NORVASC) 2.5 MG tablet Take 1 tablet (2.5 mg total) by mouth daily. 03/27/21   Raiford Noble Latif, DO  atorvastatin (LIPITOR) 40 MG tablet Take 1 tablet (40 mg total) by mouth daily. 04/07/21 07/06/21  Warren Lacy, PA-C  blood glucose meter kit and supplies KIT Dispense based on patient and insurance preference. Use up to four times daily as directed. 03/27/21   Raiford Noble Latif, DO  cholecalciferol (VITAMIN D3) 25 MCG (1000 UNIT) tablet Take 1,000 Units by mouth daily.    [provider]  ferrous sulfate 325 (65 FE) MG tablet Take 325 mg by mouth every other day.    [provider]  ibuprofen (ADVIL) 600 MG tablet Take 1 tablet (600 mg total) by mouth every 8 (eight) hours as needed for up to 30 doses for moderate pain or mild pain. Take with food 04/04/21   Wyvonnia Dusky, MD  insulin glargine-yfgn (SEMGLEE, YFGN,) 100 UNIT/ML Pen Inject 20 Units into the skin daily. 03/27/21   Raiford Noble Latif, DO  Insulin Pen Needle 31G X  5 MM MISC 1 Container by Does not apply route daily. 03/27/21   Raiford Noble Latif, DO  labetalol (NORMODYNE) 200 MG tablet Take 1 tablet (200 mg total) by mouth 2 (two) times daily. 03/27/21 04/26/21  Raiford Noble Latif, DO  losartan (COZAAR) 50 MG tablet Take 1 tablet (50 mg total) by mouth daily. 03/28/21   Raiford Noble Latif, DO  metFORMIN (GLUCOPHAGE) 1000 MG tablet Take 1,000 mg  by mouth 2 (two) times daily with a meal.    [provider]  methocarbamol (ROBAXIN) 500 MG tablet Take 1 tablet (500 mg total) by mouth 2 (two) times daily as needed for up to 20 doses for muscle spasms. 04/04/21   Wyvonnia Dusky, MD    ALLERGIES   Allergies as of 08/05/2021   (No Known Allergies)    SOCIAL HISTORY   Kim Werner lives here in [place] with her husband, daughter, and stepson. 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 Recently moved from [place] within the last 6 months, originally from [place]. She typically can complete her ADL's and IADL's without difficulties and does not use ambulatory assistive device. Denies tobacco use or recreational drug use. Does mention occasional alcohol use.   FAMILY HISTORY   Her family history is not on file.   REVIEW OF SYSTEMS   ROS per history of present illness.  PHYSICAL EXAMINATION   Blood pressure (!) 162/111, pulse (!) 115, temperature 98.7 F (37.1 C), temperature source Oral, resp. rate (!) 28, height 5' 7"  (1.702 m), weight 126 kg, SpO2 98 %.    Filed Weights   08/05/21 0435  Weight: 126 kg   GENERAL: Well-kept, not ill-appearing person laying in bed in no acute distress HENT: Normocephalic, atraumatic. Supple neck. EYES: Vision grossly in tact. No scleral icterus or conjunctival injection. CV: Tachycardic, regular rhythm. No murmurs appreciated. JVD difficult to assess d/t body habitus PULM: Normal work of breathing on nasal cannula. Clear to ausculation bilaterally. No wheezing or rales. GI: Abdomen soft, non-tender, non-distended. Normoactive bowel sounds.  MSK: Normal bulk, tone. Trace peripheral edema bilateral lower extremities.  SKIN: Warm, dry. No rashes or lesions appreciated. NEURO: Awake, alert, conversing appropriately. Grossly non-focal.  PSYCH: Normal mood, affect, speech.  SIGNIFICANT DIAGNOSTIC TESTS   ECG: Sinus  tachycardia. Right axis deviation. Borderline prolonged QT interval.  I personally reviewed patient's ECG with my interpretation as above.  CXR: Increased pulmonary vascular congestion when compared to previous. No effusions appreciated.  I personally reviewed patient's CXR with my interpretation as above.  LABS      Latest Ref Rng & Units 08/05/2021    5:00 AM 04/04/2021   10:30 AM 03/27/2021    1:39 AM  CBC  WBC 4.0 - 10.5 K/uL 10.5  9.2  8.4   Hemoglobin 12.0 - 15.0 g/dL 11.4  10.0  10.1   Hematocrit 36.0 - 46.0 % 38.5  35.1  35.1   Platelets 150 - 400 K/uL 357  404  393       Latest Ref Rng & Units 08/05/2021    5:00 AM 04/04/2021   10:30 AM 03/27/2021    1:39 AM  BMP  Glucose 70 - 99 mg/dL 395  279  260   BUN 6 - 20 mg/dL 19  11  28    Creatinine 0.44 - 1.00 mg/dL 1.18  0.83  1.14   Sodium 135 - 145 mmol/L 136  134  135  Potassium 3.5 - 5.1 mmol/L 3.9  3.8  3.1   Chloride 98 - 111 mmol/L 101  100  97   CO2 22 - 32 mmol/L 23  26  26    Calcium 8.9 - 10.3 mg/dL 9.5  9.1  8.8     CONSULTS   cardiology  ASSESSMENT   Kim Werner is 46yo person with heart failure with preserved ejection fraction 2/2 hypertensive hypertrophic cardiomyopathy, uncontrolled type II diabetes mellitus, hyperlipidemia, class III obesity presenting with significant hypertension likely causing acute pulmonary edema, consistent with severe symptomatic hypertension.   PLAN   Principal Problem:   Hypertensive emergency Active Problems:   Acute on chronic diastolic CHF (congestive heart failure) (HCC)   Type 2 diabetes mellitus without complications (HCC)   Hyperlipidemia   Obesity, Class III, BMI 40-49.9 (morbid obesity) (HCC)   Mild chronic anemia  #Severe symptomatic hypertension Kim Werner presenting after waking up early this morning with acute dyspnea. Patient initially with significantly elevated blood pressure to systolics 790 on arrival of EMS. Found to have acute pulmonary edema  concerning for severe symptomatic hypertension. On arrival to ED, patient started on nitro gtt, BiPAP,  and given dose of IV lasix. On my examination, she was mentating well, had been weaned down to 4L nasal cannula, and did not have any chest pain. Renal function mildly worsened than previous, but close to her baseline. Pressures have fluctuated in ED with systolics 383-338. Ideally would like to down-titrate nitroglycerin gtt and start oral antihypertensives. Likely has been chronically elevated, will need to be cautious to not drop BP too suddenly. Of note, she was taking labetalol at home but this was causing her significant dizziness, would attempt a different beta blocker or diuretic instead. Cardiology was consulted by ED, we appreciate their input.  - Down-titrate nitroglycerin gtt - Once BP stable start oral anti-hypertensives - Avoid home labetalol - Daily BMP - Follow-up cardiology recommendations  #Acute hypoxic respiratory failure #Acute on chronic diastolic heart failure #Hypertensive hypertrophic cardiomyopathy  On arrival by EMS, patient was found to be hypoxic to 84% on room air. She was transitioned to BiPAP on arrival to ED, has since been weaned to 4L nasal cannula. Chest x-ray with vascular congestion, BNP elevated as well. Previous Echo with heart failure with mildly reduced ejection fraction at 50% with regional wall abnormalities. Since she has had continued anginal chest pain, we will re-assess with another Echo to assess for any further ischemic changes. Current weight is 126kg, was 119kg at discharge in March of this year. She received dose of IV lasix this morning and has had adequate urine output. Will give another dose this afternoon. - IV furosemide 56m this afternoon - Would benefit from ACE/ARB and SGLT2i - Follow-up Echocardiogram   #Anginal chest pain Patient reports typical anginal chest pain with exertion over the last few weeks. She states this occurred this  morning as well, but is chest pain free upon my evaluation. Troponin mildly elevated but flat. ECG without any ischemic changes. I do not see where she has had an ischemic evaluation, was supposed to follow-up outpatient for cardiac MRI but this was never done. We will re-assess structural disease with repeat Echo, might need inpatient evaluation pending results. We appreciate cardiology's input. - Follow-up Echocardiogram - Discuss with cardiology regarding ischemic eval  #Uncontrolled type II diabetes mellitus Last A1c 11.7% in March of this year. Mentions that she does not like taking metformin due to side effects. It also sounds like  she has had some intermittent neuropathy. Glucose elevated to 350 on arrival. Will repeat A1c in the morning. I have placed her on weight-based long acting insulin and sliding scale, will need to monitor and titrate as needed. - Follow-up repeat A1c - Semglee 10 units daily - SSI  #Hyperlipidemia Last LDL 149 a few months ago, will hold off repeating as she is already on high-intensity statin. - Continue atorvastatin 72m daily  #Class III obesity Likely would benefit from GLP-1 in outpatient setting. She seems motivated to lose weight during our interview. - Follow-up with PCP to discuss GLP-1  #Chronic normocytic anemia Last work-up for anemia with borderline iron deficiency, will repeat these labs in the morning. No signs or symptoms of acute bleeding. - Follow-up iron studies - Daily CBC  BEST PRACTICE   DIET: HH w/ FR IVF: n/a DVT PPX: xarelto BOWEL: senokot-s CODE: FULL FAM COM: n/a - patient to update them herself  DISPO: Admit patient to Inpatient with expected length of stay greater than 2 midnights.  PSanjuan Dame MD Internal Medicine Resident PGY-3 Pager #[phone]/20/2023 2:07 PM

## 2021-08-05 NOTE — Consult Note (Signed)
Cardiology Consultation:   Patient ID: Kim Werner MRN: [id number]; DOB: 1975-05-07  Admit date: 08/05/2021 Date of Consult: 08/05/2021  PCP:  Merryl Hacker No   [facility] Providers Cardiologist:  Glenetta Hew, MD   {   Patient Profile:   Kim Werner is a 46 y.o. female with a hx of poorly controlled HTN, severe LVH, type 2 DM, diastolic heart failure, who is being seen 08/05/2021 for the evaluation of HTN urgency at the request of Dr Collene Gobble.  History of Present Illness:   [name] with above PMH presented to ER today with acute onset of SOB. She states she has hx of poorly controlled high blood pressure. She reports taking labetalol and amlodipine for BP at home. She used to take lisinopril but her doctor took her off it because she was pregnant. She is not longer pregnant or considering pregnancy. She states labetalol drops her BP too much sometime causing her to feel dizzy (SBP 90s). She does not take labetalol on the regular basis. She denied ever having any chest pain, dizziness, syncope, leg edema, orthopnea. She states she has no heart disease in the past, reports hx of TIA, takes ASA 74m daily, does not like taking metformin as well since it causes numbness of hands. She denied tobacco use. She states her SOB is much better now since arrival to ER and getting Nitro. She is not normally oxygen dependent.   Per chart review, she came early morning with respiratory distress. She was noted hypertensive with BP 257/165, hypoxic with Pox 84% RA at ED. She had required NRB and BIPAP support.   Diagnostic work up showed hyperglycemia 395, Cr 1.18, GFR 59. CBC with Hgb 11.4. BNP 135. Hs trop 41>39. EKG showed sinus tachycardia 115, non-specific ST-T abnormalities. CXR showed Cardiomegaly and congested appearance of central vessels. She was given IV Lasix [id number] and started on Nitro gtt at ED. Cardiology is consulted now for further input.     Past Medical History:  Diagnosis  Date   Diabetes mellitus without complication ([place]   Hypertension     History reviewed. No pertinent surgical history.   Home Medications:  Prior to Admission medications   Medication Sig Start Date End Date Taking? Authorizing Provider  Acetaminophen (TYLENOL PO) Take 2 tablets by mouth as needed (period cramps).   Yes [provider]  amLODipine (NORVASC) 2.5 MG tablet Take 1 tablet (2.5 mg total) by mouth daily. 03/27/21  Yes Sheikh, Omair Latif, DO  atorvastatin (LIPITOR) 40 MG tablet Take 1 tablet (40 mg total) by mouth daily. 04/07/21 08/05/21 Yes LaWarren LacyPA-C  Calcium Carbonate Antacid (TUMS PO) Take 2 tablets by mouth as needed (heartburn).   Yes [provider]  labetalol (NORMODYNE) 200 MG tablet Take 1 tablet (200 mg total) by mouth 2 (two) times daily. 03/27/21 08/05/21 Yes Sheikh, Omair Latif, DO  metFORMIN (GLUCOPHAGE) 1000 MG tablet Take 1,000 mg by mouth 2 (two) times daily with a meal.   Yes [provider]  Prenatal Vit-Fe Fumarate-FA (PRENATAL PO) Take 2 tablets by mouth daily in the afternoon.   Yes [provider]  blood glucose meter kit and supplies KIT Dispense based on patient and insurance preference. Use up to four times daily as directed. 03/27/21   ShRaiford Nobleatif, DO  cholecalciferol (VITAMIN D3) 25 MCG (1000 UNIT) tablet Take 1,000 Units by mouth daily. Patient not taking: Reported on 08/05/2021    [provider]  ferrous  sulfate 325 (65 FE) MG tablet Take 325 mg by mouth every other day. Patient not taking: Reported on 08/05/2021    [provider]  ibuprofen (ADVIL) 600 MG tablet Take 1 tablet (600 mg total) by mouth every 8 (eight) hours as needed for up to 30 doses for moderate pain or mild pain. Take with food Patient not taking: Reported on 08/05/2021 04/04/21   Wyvonnia Dusky, MD  insulin glargine-yfgn (SEMGLEE, YFGN,) 100 UNIT/ML Pen Inject 20 Units into the skin daily. Patient not  taking: Reported on 08/05/2021 03/27/21   Raiford Noble Latif, DO  Insulin Pen Needle 31G X 5 MM MISC 1 Container by Does not apply route daily. 03/27/21   Raiford Noble Latif, DO  losartan (COZAAR) 50 MG tablet Take 1 tablet (50 mg total) by mouth daily. Patient not taking: Reported on 08/05/2021 03/28/21   Raiford Noble Latif, DO  methocarbamol (ROBAXIN) 500 MG tablet Take 1 tablet (500 mg total) by mouth 2 (two) times daily as needed for up to 20 doses for muscle spasms. Patient not taking: Reported on 08/05/2021 04/04/21   Wyvonnia Dusky, MD    Inpatient Medications: Scheduled Meds:  amLODipine  10 mg Oral Daily   atorvastatin  40 mg Oral Daily   carvedilol  3.125 mg Oral BID WC   furosemide  80 mg Intravenous Once   insulin aspart  0-20 Units Subcutaneous TID WC   insulin glargine-yfgn  10 Units Subcutaneous Daily   losartan  50 mg Oral Daily   rivaroxaban  10 mg Oral Daily   sodium chloride flush  3 mL Intravenous Q12H   Continuous Infusions:  sodium chloride     PRN Meds: sodium chloride, acetaminophen, ondansetron (ZOFRAN) IV, sodium chloride flush  Allergies:   No Known Allergies  Social History:   Social History   Socioeconomic History   Marital status: Married    Spouse name: Not on file   Number of children: Not on file   Years of education: Not on file   Highest education level: Not on file  Occupational History   Not on file  Tobacco Use   Smoking status: Never   Smokeless tobacco: Never  Vaping Use   Vaping Use: Never used  Substance and Sexual Activity   Alcohol use: Never   Drug use: Never   Sexual activity: Yes  Other Topics Concern   Not on file  Social History Narrative   Not on file   Social Determinants of Health   Financial Resource Strain: Not on file  Food Insecurity: Not on file  Transportation Needs: Not on file  Physical Activity: Not on file  Stress: Not on file  Social Connections: Not on file  Intimate Partner Violence: Not on  file    Family History:   Family hx of HTN   ROS:  Constitutional: Denied fever, chills, malaise, night sweats Eyes: Denied vision change or loss Ears/Nose/Mouth/Throat: Denied ear ache, sore throat, coughing, sinus pain Cardiovascular: see HPI  Respiratory:see HPI  Gastrointestinal: Denied nausea, vomiting, abdominal pain, diarrhea Genital/Urinary: Denied dysuria, hematuria, urinary frequency/urgency Musculoskeletal: Denied muscle ache, joint pain, weakness Skin: Denied rash, wound Neuro: Denied headache, dizziness, syncope Psych: Denied history of depression/anxiety  Endocrine: history of diabetes   Physical Exam/Data:   Vitals:   08/05/21 1230 08/05/21 1245 08/05/21 1300 08/05/21 1333  BP: (!) 177/125 (!) 209/121 (!) 179/110 (!) 162/111  Pulse: (!) 102 (!) 110 (!) 115   Resp:    Marland Kitchen)  28  Temp:   98.1 F (36.7 C) 98.7 F (37.1 C)  TempSrc:    Oral  SpO2: 100% 98% 98%   Weight:      Height:        Intake/Output Summary (Last 24 hours) at 08/05/2021 1512 Last data filed at 08/05/2021 1252 Gross per 24 hour  Intake 331.6 ml  Output 800 ml  Net -468.4 ml      08/05/2021    4:35 AM 04/07/2021    8:58 AM 03/27/2021    5:00 AM  Last 3 Weights  Weight (lbs) 277 lb 12.5 oz 268 lb 6.4 oz 259 lb 6.4 oz  Weight (kg) 126 kg 121.745 kg 117.663 kg     Body mass index is 43.51 kg/m.   Vitals:  Vitals:   08/05/21 1300 08/05/21 1333  BP: (!) 179/110 (!) 162/111  Pulse: (!) 115   Resp:  (!) 28  Temp: 98.1 F (36.7 C) 98.7 F (37.1 C)  SpO2: 98%    General Appearance: In no apparent distress, laying in bed, obese  HEENT: Normocephalic, atraumatic. Neck: Supple, trachea midline, no JVDs Cardiovascular: Regular rate and rhythm, normal S1-S2,  no murmur Respiratory: Resting breathing unlabored, lungs sounds clear to auscultation bilaterally, no use of accessory muscles. On 2LNC Gastrointestinal: Bowel sounds positive, abdomen soft Extremities: Able to move all extremities  in bed without difficulty, no edema Musculoskeletal: Normal muscle bulk and tone Skin: Intact, warm, dry. No rashes or petechiae noted in exposed areas.  Neurologic: Alert, oriented to person, place and time. Fluent speech, no facial droop, no cognitive deficit Psychiatric: Normal affect. Mood is appropriate.    EKG:  The EKG was personally reviewed and demonstrates:  Sinus tachycardia 115 bom, with non-specific ST-T abnormalities   Telemetry:  Telemetry was personally reviewed and demonstrates:  ST  Relevant CV Studies:  Echo from 03/26/21:   1. Left ventricular ejection fraction, by estimation, is 50%. The left  ventricle has mildly decreased function. The left ventricle demonstrates  regional wall motion abnormalities (see scoring diagram/findings for  description). There is severe asymmetric   left ventricular hypertrophy. Left ventricular diastolic parameters are  indeterminate. Elevated left atrial pressure. No systolic anterior motion  of the mitral valve or LVOT obstruction.   2. Right ventricular systolic function is normal. The right ventricular  size is normal. Tricuspid regurgitation signal is inadequate for assessing  PA pressure.   3. A small pericardial effusion is present.   4. The mitral valve is normal in structure. No evidence of mitral valve  regurgitation. No evidence of mitral stenosis.   5. The aortic valve was not well visualized. Aortic valve regurgitation  is not visualized.    Laboratory Data:  High Sensitivity Troponin:   Recent Labs  Lab 08/05/21 0500 08/05/21 0743  TROPONINIHS 41* 39*     Chemistry Recent Labs  Lab 08/05/21 0500  NA 136  K 3.9  CL 101  CO2 23  GLUCOSE 395*  BUN 19  CREATININE 1.18*  CALCIUM 9.5  GFRNONAA 59*  ANIONGAP 12    Recent Labs  Lab 08/05/21 0500  PROT 7.6  ALBUMIN 3.5  AST 31  ALT 31  ALKPHOS 87  BILITOT 0.6   Lipids No results for input(s): "CHOL", "TRIG", "HDL", "LABVLDL", "LDLCALC", "CHOLHDL"  in the last 168 hours.  Hematology Recent Labs  Lab 08/05/21 0500  WBC 10.5  RBC 5.61*  HGB 11.4*  HCT 38.5  MCV 68.6*  MCH 20.3*  MCHC  29.6*  RDW 18.8*  PLT 357   Thyroid No results for input(s): "TSH", "FREET4" in the last 168 hours.  BNP Recent Labs  Lab 08/05/21 0500  BNP 135.6*    DDimer No results for input(s): "DDIMER" in the last 168 hours.   Radiology/Studies:  DG Chest Portable 1 View  Result Date: 08/05/2021 CLINICAL DATA:  Evaluate for pulmonary edema EXAM: PORTABLE CHEST 1 VIEW COMPARISON:  03/27/2021 FINDINGS: Chronic cardiomegaly. Vascular pedicle widening and prominence of hilar vessels but no Kerley lines or effusion. No consolidation. IMPRESSION: Cardiomegaly and congested appearance of central vessels. Negative for edema or pneumonia. Electronically Signed   By: Jorje Guild M.D.   On: 08/05/2021 04:54     Assessment and Plan:   Hypertensive emergency  - history suggestive of flash pulmonary edema with BP 257/165 initially, due to medication non-compliance  - BP improved now with Nitro gtt, wean off please - Hold off further diuresis  - will start PO losartan 55m daily, amlodipine 176mdaily, and switch labetalol to Coreg 3.12544mID, monitor BP trend and tolerance to above meds, encourage medication compliance  - Echo is pending, will follow   Severe LVH - Echo from 03/26/21 showed LVEF 50%, severe asymmetric left ventricular hypertrophy, indeterminate diastolic parameters, no LVOT obstruction, normal RV - likely due to poorly controlled HTN - repeat Echo is pending, will follow - she denied any significant CHF symptoms historically, will monitor and aggressive control BP for now - Cardiac MRI was recommended in the past, she never completed it, can be done outpatient if she is interested   Elevated troponin - she clearly denied any chest pain during encounter, appears mentioned this to medicine team - Hs trop flat - EKG without specific  changes suggestive ischemia - probably demand from HTN emergency - will consider ischemic workup if symptoms occurs   Type 2 DM uncontrolled HLD Obesity Anemia - per medicine service     Risk Assessment/Risk Scores:      For questions or updates, please contact [facility] Please consult [email or web address] for contact info under    Signed, XikMargie BilletP  08/05/2021 3:12 PM

## 2021-08-05 NOTE — Progress Notes (Signed)
CBG (last 3)  Recent Labs    08/05/21 1416 08/05/21 1550  GLUCAP 467* 441*   Salena Saner, MD notified of above CBG results. 25 units of Novolog was ordered and administered for first results. CBG rechecked and 20 units was ordered and administered. Nursing staff will continue to monitor

## 2021-08-05 NOTE — ED Provider Notes (Addendum)
Just off Bipap with improvement on Nitro drip and Lasix. Need to observe if needs repeat bipap. Continue to monitor closely and admit. W/U thus far c/w pulm edema, less likely infection. Physical Exam  BP (!) 168/106   Pulse (!) 112   Temp 98.2 F (36.8 C) (Axillary)   Resp (!) 33   Ht 5\' 7"  (1.702 m)   Wt 126 kg   SpO2 96%   BMI 43.51 kg/m   Physical Exam  Procedures  Procedures  ED Course / MDM    Medical Decision Making Amount and/or Complexity of Data Reviewed Labs: ordered. Radiology: ordered.  Risk Prescription drug management. Decision regarding hospitalization.   8: 05 recheck: Patient is doing well off BiPAP.  She is on nasal cannula oxygen oxygen saturation 97%.  Blood pressures have improved and nitroglycerin drip.  Patient remains tachycardic at 113 but is comfortable without chest pain and feels that shortness of breath has significantly improved.  Findings are consistent with hypertensive emergency responding to Lasix and nitroglycerin.  Patient is seen by [facility] cardiology for hypertensive hypertrophic cardiomyopathy.  Will consult cardiology.  [facility] reviewed the case with me.  Per Dr. Biomedical scientist, recommendations for medical admission and cardiology will consult.  Consult: Reviewed with internal medicine teaching service for admission.      Jens Som, MD 08/05/21 08/07/21    [id number], MD 08/05/21 [phone]

## 2021-08-05 NOTE — Progress Notes (Signed)
  Echocardiogram 2D Echocardiogram has been performed.  Kim Werner 08/05/2021, 3:36 PM

## 2021-08-05 NOTE — Progress Notes (Signed)
   08/05/21 1333  Assess: MEWS Score  Temp 98.7 F (37.1 C)  BP (!) 162/111  MAP (mmHg) 125  Resp (!) 28  O2 Device Room Air  Assess: MEWS Score  MEWS Temp 0  MEWS Systolic 0  MEWS Pulse 2  MEWS RR 2  MEWS LOC 0  MEWS Score 4  MEWS Score Color Red  Assess: if the MEWS score is Yellow or Red  Were vital signs taken at a resting state? Yes  Focused Assessment No change from prior assessment  Does the patient meet 2 or more of the SIRS criteria? Yes  Does the patient have a confirmed or suspected source of infection? No  MEWS guidelines implemented *See Row Information* Yes  Treat  MEWS Interventions Escalated (See documentation below)  Pain Scale 0-10  Pain Score 0  Take Vital Signs  Increase Vital Sign Frequency  Red: Q 1hr X 4 then Q 4hr X 4, if remains red, continue Q 4hrs  Escalate  MEWS: Escalate Red: discuss with charge nurse/RN and provider, consider discussing with RRT  Notify: Charge Nurse/RN  Name of Charge Nurse/RN Notified Chrissy, RN  Date Charge Nurse/RN Notified 08/05/21  Time Charge Nurse/RN Notified 1401  Document  Patient Outcome Stabilized after interventions  Progress note created (see row info) Yes  Assess: SIRS CRITERIA  SIRS Temperature  0  SIRS Pulse 1  SIRS Respirations  1  SIRS WBC 1  SIRS Score Sum  3

## 2021-08-05 NOTE — ED Notes (Signed)
Pt removed from Bi-PAP, placed on 4L [place] and given ice chips, orders from Dr. Clayborne Dana

## 2021-08-05 NOTE — ED Triage Notes (Signed)
Pt arrived from home via [facility], c/o spb yesterday at work, SON got worse tonight, initial sats 80% on RA, lungs sounds diminished and fluids in all fields, 125 mg solumedrol given in route. Pt not complaint on her BP or diabetes meds. Pt arrived on CPAP, A&O x4. Severe HTN 248/160 per EMS.

## 2021-08-05 NOTE — ED Notes (Signed)
Verbal order to increase Nitro drip to 200 mcg/min per Dr. Clayborne Dana

## 2021-08-05 NOTE — ED Notes (Signed)
Per Dr. Clayborne Dana no MAP goal really, but if her BP gets to 110 systolic then start titrating back down until her BP levels out around 100-120.

## 2021-08-05 NOTE — Progress Notes (Signed)
Patient currently on 4L [place] and tolerating well, BIPAP not indicated at this time.

## 2021-08-06 ENCOUNTER — Telehealth (HOSPITAL_COMMUNITY): Payer: Self-pay | Admitting: Pharmacy Technician

## 2021-08-06 ENCOUNTER — Other Ambulatory Visit (HOSPITAL_COMMUNITY): Payer: Self-pay

## 2021-08-06 DIAGNOSIS — I5031 Acute diastolic (congestive) heart failure: Secondary | ICD-10-CM

## 2021-08-06 DIAGNOSIS — E119 Type 2 diabetes mellitus without complications: Secondary | ICD-10-CM

## 2021-08-06 DIAGNOSIS — E785 Hyperlipidemia, unspecified: Secondary | ICD-10-CM

## 2021-08-06 LAB — CBC
HCT: 37.2 % (ref 36.0–46.0)
Hemoglobin: 11 g/dL — ABNORMAL LOW (ref 12.0–15.0)
MCH: 20.2 pg — ABNORMAL LOW (ref 26.0–34.0)
MCHC: 29.6 g/dL — ABNORMAL LOW (ref 30.0–36.0)
MCV: 68.4 fL — ABNORMAL LOW (ref 80.0–100.0)
Platelets: 335 10*3/uL (ref 150–400)
RBC: 5.44 MIL/uL — ABNORMAL HIGH (ref 3.87–5.11)
RDW: 19 % — ABNORMAL HIGH (ref 11.5–15.5)
WBC: 16 10*3/uL — ABNORMAL HIGH (ref 4.0–10.5)
nRBC: 0 % (ref 0.0–0.2)

## 2021-08-06 LAB — BASIC METABOLIC PANEL
Anion gap: 12 (ref 5–15)
BUN: 33 mg/dL — ABNORMAL HIGH (ref 6–20)
CO2: 25 mmol/L (ref 22–32)
Calcium: 9.9 mg/dL (ref 8.9–10.3)
Chloride: 98 mmol/L (ref 98–111)
Creatinine, Ser: 1.18 mg/dL — ABNORMAL HIGH (ref 0.44–1.00)
GFR, Estimated: 59 mL/min — ABNORMAL LOW (ref >60)
Glucose, Bld: 287 mg/dL — ABNORMAL HIGH (ref 70–99)
Potassium: 3.5 mmol/L (ref 3.5–5.1)
Sodium: 135 mmol/L (ref 135–145)

## 2021-08-06 LAB — IRON AND TIBC
Iron: 26 ug/dL — ABNORMAL LOW (ref 28–170)
Saturation Ratios: 6 % — ABNORMAL LOW (ref 10.4–31.8)
TIBC: 407 ug/dL (ref 250–450)
UIBC: 381 ug/dL

## 2021-08-06 LAB — GLUCOSE, CAPILLARY
Glucose-Capillary: 349 mg/dL — ABNORMAL HIGH (ref 70–99)
Glucose-Capillary: 200 mg/dL — ABNORMAL HIGH (ref 70–99)
Glucose-Capillary: 278 mg/dL — ABNORMAL HIGH (ref 70–99)
Glucose-Capillary: 305 mg/dL — ABNORMAL HIGH (ref 70–99)

## 2021-08-06 LAB — HEMOGLOBIN A1C
Hgb A1c MFr Bld: 12.8 % — ABNORMAL HIGH (ref 4.8–5.6)
Mean Plasma Glucose: 320.66 mg/dL

## 2021-08-06 LAB — FERRITIN: Ferritin: 20 ng/mL (ref 11–307)

## 2021-08-06 MED ORDER — BASAGLAR KWIKPEN 100 UNIT/ML ~~LOC~~ SOPN
30.0000 [IU] | PEN_INJECTOR | Freq: Every day | SUBCUTANEOUS | 5 refills | Status: DC
  Filled 2021-08-06: qty 9, 30d supply, fill #0

## 2021-08-06 MED ORDER — INSULIN ASPART 100 UNIT/ML IJ SOLN
20.0000 [IU] | Freq: Once | INTRAMUSCULAR | Status: AC
  Administered 2021-08-06: 20 [IU] via SUBCUTANEOUS

## 2021-08-06 MED ORDER — CARVEDILOL 12.5 MG PO TABS
12.5000 mg | ORAL_TABLET | Freq: Two times a day (BID) | ORAL | Status: DC
  Administered 2021-08-06: 12.5 mg via ORAL
  Filled 2021-08-06: qty 1

## 2021-08-06 MED ORDER — AMLODIPINE BESYLATE 10 MG PO TABS
10.0000 mg | ORAL_TABLET | Freq: Every day | ORAL | 5 refills | Status: DC
  Filled 2021-08-06: qty 30, 30d supply, fill #0

## 2021-08-06 MED ORDER — ACCU-CHEK GUIDE ME W/DEVICE KIT
PACK | 0 refills | Status: AC
  Filled 2021-08-06: qty 1, 30d supply, fill #0

## 2021-08-06 MED ORDER — XULTOPHY 100-3.6 UNIT-MG/ML ~~LOC~~ SOPN
30.0000 [IU] | PEN_INJECTOR | Freq: Every day | SUBCUTANEOUS | 2 refills | Status: DC
  Filled 2021-08-06: qty 9, 30d supply, fill #0

## 2021-08-06 MED ORDER — CARVEDILOL 12.5 MG PO TABS
12.5000 mg | ORAL_TABLET | Freq: Two times a day (BID) | ORAL | 5 refills | Status: DC
  Filled 2021-08-06: qty 60, 30d supply, fill #0

## 2021-08-06 MED ORDER — ATORVASTATIN CALCIUM 40 MG PO TABS
40.0000 mg | ORAL_TABLET | Freq: Every day | ORAL | 5 refills | Status: DC
  Filled 2021-08-06: qty 30, 30d supply, fill #0

## 2021-08-06 MED ORDER — GLUCOSE BLOOD VI STRP
ORAL_STRIP | 0 refills | Status: DC
  Filled 2021-08-06: qty 100, 30d supply, fill #0

## 2021-08-06 MED ORDER — INSULIN GLARGINE-YFGN 100 UNIT/ML ~~LOC~~ SOLN
15.0000 [IU] | Freq: Two times a day (BID) | SUBCUTANEOUS | Status: DC
  Filled 2021-08-06: qty 0.15

## 2021-08-06 MED ORDER — SEMAGLUTIDE(0.25 OR 0.5MG/DOS) 2 MG/1.5ML ~~LOC~~ SOPN
0.2500 mg | PEN_INJECTOR | SUBCUTANEOUS | 1 refills | Status: DC
  Filled 2021-08-06: qty 1.5, 56d supply, fill #0

## 2021-08-06 MED ORDER — INSULIN PEN NEEDLE 32G X 4 MM MISC
1.0000 | Freq: Every day | 5 refills | Status: DC
  Filled 2021-08-06: qty 100, 30d supply, fill #0

## 2021-08-06 MED ORDER — INSULIN GLARGINE-YFGN 100 UNIT/ML ~~LOC~~ SOLN
10.0000 [IU] | Freq: Once | SUBCUTANEOUS | Status: AC
Start: 2021-08-06 — End: 2021-08-06
  Administered 2021-08-06: 10 [IU] via SUBCUTANEOUS
  Filled 2021-08-06: qty 0.1

## 2021-08-06 MED ORDER — LOSARTAN POTASSIUM-HCTZ 50-12.5 MG PO TABS
1.0000 | ORAL_TABLET | Freq: Every day | ORAL | 11 refills | Status: DC
  Filled 2021-08-06: qty 30, 30d supply, fill #0

## 2021-08-06 MED ORDER — BLOOD GLUCOSE METER KIT
PACK | 0 refills | Status: AC
Start: 2021-08-06 — End: ?
  Filled 2021-08-06: qty 1, fill #0

## 2021-08-06 MED ORDER — INSULIN ASPART 100 UNIT/ML FLEXPEN
5.0000 [IU] | PEN_INJECTOR | Freq: Three times a day (TID) | SUBCUTANEOUS | 11 refills | Status: DC
  Filled 2021-08-06: qty 6, 30d supply, fill #0

## 2021-08-06 MED ORDER — ACCU-CHEK SOFTCLIX LANCETS MISC
0 refills | Status: DC
  Filled 2021-08-06: qty 100, 30d supply, fill #0

## 2021-08-06 NOTE — Progress Notes (Addendum)
Progress Note  Patient Name: Kim Werner Date of Encounter: 08/06/2021  [facility] Cardiologist: Bryan Lemma, MD   Subjective   No complaints this morning. Feeling much better.   Inpatient Medications    Scheduled Meds:  amLODipine  10 mg Oral Daily   atorvastatin  40 mg Oral Daily   carvedilol  12.5 mg Oral BID WC   insulin aspart  0-20 Units Subcutaneous TID WC   insulin aspart  5 Units Subcutaneous TID WC   insulin glargine-yfgn  10 Units Subcutaneous Daily   losartan  50 mg Oral Daily   rivaroxaban  10 mg Oral Daily   sodium chloride flush  3 mL Intravenous Q12H   Continuous Infusions:  sodium chloride     PRN Meds: sodium chloride, acetaminophen, ondansetron (ZOFRAN) IV, sodium chloride flush   Vital Signs    Vitals:   08/05/21 2031 08/06/21 0557 08/06/21 0757 08/06/21 0906  BP: (!) 184/113 (!) 155/92 (!) 152/93 (!) 144/96  Pulse: (!) 114 94 91 86  Resp: (!) 22 18 16    Temp: 98.2 F (36.8 C) 98.2 F (36.8 C) (!) 97.5 F (36.4 C)   TempSrc: Oral Oral Oral   SpO2: 94%  98%   Weight:  117.1 kg    Height:        Intake/Output Summary (Last 24 hours) at 08/06/2021 1003 Last data filed at 08/06/2021 0612 Gross per 24 hour  Intake 614.47 ml  Output 1600 ml  Net -985.53 ml      08/06/2021    5:57 AM 08/05/2021    4:35 AM 04/07/2021    8:58 AM  Last 3 Weights  Weight (lbs) 258 lb 1.6 oz 277 lb 12.5 oz 268 lb 6.4 oz  Weight (kg) 117.073 kg 126 kg 121.745 kg      Telemetry    Sinus Rhythm - Personally Reviewed  ECG    No new tracing.   Physical Exam   GEN: No acute distress.   Neck: No JVD Cardiac: RRR, no murmurs, rubs, or gallops.  Respiratory: Clear to auscultation bilaterally. GI: Soft, nontender, non-distended  MS: No edema; No deformity. Neuro:  Nonfocal  Psych: Normal affect   Labs    High Sensitivity Troponin:   Recent Labs  Lab 08/05/21 0500 08/05/21 0743  TROPONINIHS 41* 39*     Chemistry Recent Labs  Lab  08/05/21 0500 08/06/21 0229  NA 136 135  K 3.9 3.5  CL 101 98  CO2 23 25  GLUCOSE 395* 287*  BUN 19 33*  CREATININE 1.18* 1.18*  CALCIUM 9.5 9.9  PROT 7.6  --   ALBUMIN 3.5  --   AST 31  --   ALT 31  --   ALKPHOS 87  --   BILITOT 0.6  --   GFRNONAA 59* 59*  ANIONGAP 12 12    Lipids No results for input(s): "CHOL", "TRIG", "HDL", "LABVLDL", "LDLCALC", "CHOLHDL" in the last 168 hours.  Hematology Recent Labs  Lab 08/05/21 0500 08/06/21 0229  WBC 10.5 16.0*  RBC 5.61* 5.44*  HGB 11.4* 11.0*  HCT 38.5 37.2  MCV 68.6* 68.4*  MCH 20.3* 20.2*  MCHC 29.6* 29.6*  RDW 18.8* 19.0*  PLT 357 335   Thyroid No results for input(s): "TSH", "FREET4" in the last 168 hours.  BNP Recent Labs  Lab 08/05/21 0500  BNP 135.6*    DDimer No results for input(s): "DDIMER" in the last 168 hours.   Radiology    ECHOCARDIOGRAM  COMPLETE  Result Date: 08/05/2021    ECHOCARDIOGRAM REPORT   Patient Name:   Kim Werner Date of Exam: 08/05/2021 Medical Rec #:  [id number]         Height:       67.0 in Accession #:    [id number]        Weight:       277.8 lb Date of Birth:  [birth year]         BSA:          2.325 m Patient Age:    45 years          BP:           116/72 mmHg Patient Gender: F                 HR:           104 bpm. Exam Location:  Inpatient Procedure: 2D Echo Indications:    congestive heart failure  History:        Patient has prior history of Echocardiogram examinations. CHF;                 Risk Factors:Diabetes, Hypertension and Dyslipidemia.  Sonographer:    Delcie Roch RDCS Referring Phys: [id number] [facility] PFEIFFER  Sonographer Comments: Patient is morbidly obese. Image acquisition challenging due to patient body habitus. IMPRESSIONS  1. Left ventricular ejection fraction, by estimation, is 50 to 55%. The left ventricle has low normal function. The left ventricle has no regional wall motion abnormalities. There is severe concentric left ventricular hypertrophy. Indeterminate  diastolic filling due to E-A fusion.  2. Right ventricular systolic function is normal. The right ventricular size is normal.  3. The mitral valve is normal in structure. Trivial mitral valve regurgitation. No evidence of mitral stenosis.  4. The aortic valve is tricuspid. Aortic valve regurgitation is trivial. No aortic stenosis is present.  5. Aortic dilatation noted. There is mild dilatation of the ascending aorta, measuring 37 mm.  6. The inferior vena cava is normal in size with greater than 50% respiratory variability, suggesting right atrial pressure of 3 mmHg. FINDINGS  Left Ventricle: Left ventricular ejection fraction, by estimation, is 50 to 55%. The left ventricle has low normal function. The left ventricle has no regional wall motion abnormalities. The left ventricular internal cavity size was normal in size. There is severe concentric left ventricular hypertrophy. Indeterminate diastolic filling due to E-A fusion. Right Ventricle: The right ventricular size is normal. No increase in right ventricular wall thickness. Right ventricular systolic function is normal. Left Atrium: Left atrial size was normal in size. Right Atrium: Right atrial size was normal in size. Pericardium: There is no evidence of pericardial effusion. Mitral Valve: The mitral valve is normal in structure. Trivial mitral valve regurgitation. No evidence of mitral valve stenosis. Tricuspid Valve: The tricuspid valve is normal in structure. Tricuspid valve regurgitation is not demonstrated. No evidence of tricuspid stenosis. Aortic Valve: The aortic valve is tricuspid. Aortic valve regurgitation is trivial. No aortic stenosis is present. Pulmonic Valve: The pulmonic valve was normal in structure. Pulmonic valve regurgitation is not visualized. No evidence of pulmonic stenosis. Aorta: Aortic dilatation noted. There is mild dilatation of the ascending aorta, measuring 37 mm. Venous: The inferior vena cava is normal in size with greater  than 50% respiratory variability, suggesting right atrial pressure of 3 mmHg. IAS/Shunts: No atrial level shunt detected by color flow Doppler.  LEFT VENTRICLE PLAX 2D LVIDd:  4.70 cm LVIDs:         3.50 cm LV PW:         1.60 cm LV IVS:        1.60 cm  RIGHT VENTRICLE             IVC RV Basal diam:  2.60 cm     IVC diam: 1.70 cm RV S prime:     18.50 cm/s TAPSE (M-mode): 2.6 cm LEFT ATRIUM             Index        RIGHT ATRIUM           Index LA diam:        3.80 cm 1.63 cm/m   RA Area:     14.60 cm LA Vol (A2C):   29.8 ml 12.82 ml/m  RA Volume:   29.20 ml  12.56 ml/m LA Vol (A4C):   35.0 ml 15.05 ml/m LA Biplane Vol: 33.1 ml 14.24 ml/m  AORTIC VALVE LVOT Vmax:   96.80 cm/s LVOT Vmean:  63.100 cm/s LVOT VTI:    0.160 m  AORTA Ao Asc diam: 3.70 cm  SHUNTS Systemic VTI: 0.16 m Chilton Si MD Electronically signed by Chilton Si MD Signature Date/Time: 08/05/2021/6:01:33 PM    Final    DG Chest Portable 1 View  Result Date: 08/05/2021 CLINICAL DATA:  Evaluate for pulmonary edema EXAM: PORTABLE CHEST 1 VIEW COMPARISON:  03/27/2021 FINDINGS: Chronic cardiomegaly. Vascular pedicle widening and prominence of hilar vessels but no Kerley lines or effusion. No consolidation. IMPRESSION: Cardiomegaly and congested appearance of central vessels. Negative for edema or pneumonia. Electronically Signed   By: Tiburcio Pea M.D.   On: 08/05/2021 04:54    Cardiac Studies   Echo: 08/05/21  IMPRESSIONS    1. Left ventricular ejection fraction, by estimation, is 50 to 55%. The  left ventricle has low normal function. The left ventricle has no regional  wall motion abnormalities. There is severe concentric left ventricular  hypertrophy. Indeterminate  diastolic filling due to E-A fusion.   2. Right ventricular systolic function is normal. The right ventricular  size is normal.   3. The mitral valve is normal in structure. Trivial mitral valve  regurgitation. No evidence of mitral stenosis.    4. The aortic valve is tricuspid. Aortic valve regurgitation is trivial.  No aortic stenosis is present.   5. Aortic dilatation noted. There is mild dilatation of the ascending  aorta, measuring 37 mm.   6. The inferior vena cava is normal in size with greater than 50%  respiratory variability, suggesting right atrial pressure of 3 mmHg.   FINDINGS   Left Ventricle: Left ventricular ejection fraction, by estimation, is 50  to 55%. The left ventricle has low normal function. The left ventricle has  no regional wall motion abnormalities. The left ventricular internal  cavity size was normal in size.  There is severe concentric left ventricular hypertrophy. Indeterminate  diastolic filling due to E-A fusion.   Right Ventricle: The right ventricular size is normal. No increase in  right ventricular wall thickness. Right ventricular systolic function is  normal.   Left Atrium: Left atrial size was normal in size.   Right Atrium: Right atrial size was normal in size.   Pericardium: There is no evidence of pericardial effusion.   Mitral Valve: The mitral valve is normal in structure. Trivial mitral  valve regurgitation. No evidence of mitral valve stenosis.   Tricuspid Valve: The  tricuspid valve is normal in structure. Tricuspid  valve regurgitation is not demonstrated. No evidence of tricuspid  stenosis.   Aortic Valve: The aortic valve is tricuspid. Aortic valve regurgitation is  trivial. No aortic stenosis is present.   Pulmonic Valve: The pulmonic valve was normal in structure. Pulmonic valve  regurgitation is not visualized. No evidence of pulmonic stenosis.   Aorta: Aortic dilatation noted. There is mild dilatation of the ascending  aorta, measuring 37 mm.   Venous: The inferior vena cava is normal in size with greater than 50%  respiratory variability, suggesting right atrial pressure of 3 mmHg.   IAS/Shunts: No atrial level shunt detected by color flow Doppler.    Patient Profile     46 y.o. female with a hx of poorly controlled HTN, severe LVH, type 2 DM, diastolic heart failure, who was seen 08/05/2021 for the evaluation of HTN urgency at the request of Dr Marijo Conception.  Assessment & Plan    Hypertensive emergency: history suggestive of flash pulmonary edema with BP 257/165 initially, due to medication non-compliance. Initially on IV nitro, which has been weaned.  -- started on losartan 50mg  daily, amlodipine 10mg  daily, and Coreg 12.5mg  BID with significant improvement.  => She would definitely benefit from being on a diuretic with accelerated hypertension.  Would like to see addition of either separate chlorthalidone or potentially just simply adding HCTZ to the losartan. Would then also consider a PRN prescription for 0.2 clonidine and 40 mg Lasix to take as needed for breakthrough spells like what brought her to the emergency room.  This could potentially preempt hospitalization.   Severe LVH: Echo from 03/26/21 showed LVEF 50%, severe asymmetric left ventricular hypertrophy, indeterminate diastolic parameters, no LVOT obstruction, normal RV.  -- Echo 7/20 with LVEF of 50-55% with no rWMA, severe concentric LVH, normal EV function.   Elevated troponin: hsTn 41>>39, demand ischemia in the setting of severely elevated blood pressures. No chest pain.  Type 2 DM uncontrolled HLD Obesity Anemia -- per medicine service   Discussed compliance with patient and the need to come to follow up visits as well as remain on medications. She reports having transportation and able to pay to for medications.    For questions or updates, please contact [facility] Please consult [email or web address] for contact info under       Signed, 05/26/21, NP  08/06/2021, 10:03 AM     ATTENDING ATTESTATION  I have seen, examined and evaluated the patient this AM on rounds along with Laverda Page, NP-C.  After reviewing all the available data and chart, we  discussed the patients laboratory, study & physical findings as well as symptoms in detail. I agree with her findings, examination as well as impression recommendations as per our discussion.    Attending adjustments noted in italics.    Plan per primary team is discharged today on current meds.  She has close follow-up scheduled in our clinic with 08/08/2021, NP.  Recommendations for BP meds noted above.  [facility] will sign off.   Medication Recommendations: Could consider adding HCTZ to current meds versus simply continuing current meds and adjust as an outpatient. Other recommendations (labs, testing, etc): No further testing now. Follow up as an outpatient: Outpatient appointment scheduled    Laverda Page, M.D., M.S. Interventional Cardiologist   Pager # [phone] Phone # [phone] [address] [place], [address] [place]

## 2021-08-06 NOTE — Discharge Instructions (Addendum)
Ms. Janice, It was a pleasure taking care of you at [facility]. You were admitted for severely high blood pressure causing you to have shortness of breath. You were treated with IV Lasix and started on blood pressure medications. Your blood sugar was also very high so we made some changes to your insulin. We are discharging you home now that you are doing better. Please follow the following instructions.  1) For your blood pressure, take carvedilol 12.5 mg twice daily, amlodipine 10 mg daily, and losartan-HCTZ (Hyzaar) 50-12.5 mg daily 2) For your diabetes, inject Basaglar 30 units at bedtime, NovoLog 5 units 3 times with meals and Ozempic 0.25 mg once weekly.  3) Follow-up with the Internal Medicine Clinic and Cardiology next Friday, 7/28 4) Reduce your intake of fatty foods and foods high in starch. Check your blood pressure daily and bring the log to your hospital follow up next week.   Take care,  Dr. Sharrell Ku, MD, MPH

## 2021-08-06 NOTE — Progress Notes (Addendum)
Inpatient Diabetes Program Recommendations  AACE/ADA: New Consensus Statement on Inpatient Glycemic Control (2015)  Target Ranges:  Prepandial:   less than 140 mg/dL      Peak postprandial:   less than 180 mg/dL (1-2 hours)      Critically ill patients:  140 - 180 mg/dL   Lab Results  Component Value Date   GLUCAP 278 (H) 08/06/2021   HGBA1C 12.8 (H) 08/06/2021    Review of Glycemic Control  Latest Reference Range & Units 08/05/21 15:50 08/05/21 21:22 08/06/21 01:01 08/06/21 07:55 08/06/21 11:43  Glucose-Capillary 70 - 99 mg/dL 147 (H) 829 (H) 562 (H) 200 (H) 278 (H)   Diabetes history: DM 2 Outpatient Diabetes medications:  Semglee 20 units daily (not taking) Metformin 1000 mg bid (not taking) Current orders for Inpatient glycemic control:  Novolog 0-20 units tid with meals Semglee 15 units bid Novolog 5 units tid with meals (hold if patient eats less than 50% or NPO) Inpatient Diabetes Program Recommendations:   Note Semglee increased today.  A1C indicates poor control of DM (12.8%).  Will follow-up with patient regarding DM.  Thanks,  Beryl Meager, RN, BC-ADM Inpatient Diabetes Coordinator Pager [phone]  (8a-5p)  Addendum:  Spoke with patient by phone.  Note that patient was prescribed insulin on 03/27/21 when discharged from the hospital, however she states that her insurance would not cover the [facility] so she did not get prescription filled. She also is not taking her metformin stating that it upsets her stomach.  We discussed her A1C of 12.8% and that it is worse then when she was here in March. Patient states " I know I need insulin and need to get it under control".  Requested benefits check from pharmacy.  Patient's insurance prefers "Basaglar" and there is a "zero" co-pay.  Will alert MD and patient. Patient states that she knows how to use insulin pen.  We briefly reviewed goal blood sugar values, importance of monitoring at least bid, and hypoglycemia signs,  symptoms and treatment.  Patient verbalized understanding.  Will follow.

## 2021-08-06 NOTE — Discharge Summary (Signed)
Name: Kim Werner, Kim Werner 46 y.o. PCP: Pcp, No  Date of Admission: 08/05/2021  4:27 AM Date of Discharge: 08/06/2021 3:40 PM Attending Physician: Axel Filler, *  Discharge Diagnosis: Principal Problem:   Hypertensive emergency Active Problems:   Acute on chronic diastolic CHF (congestive heart failure) ([place])   Type 2 diabetes mellitus without complications (HCC)   Hyperlipidemia   Obesity, Class III, BMI 40-49.9 (morbid obesity) ([place])   Mild chronic anemia   Discharge Medications: Allergies as of 08/06/2021   No Known Allergies      Medication List     STOP taking these medications    cholecalciferol 25 MCG (1000 UNIT) tablet Commonly known as: VITAMIN D3   ferrous sulfate 325 (65 FE) MG tablet   ibuprofen 600 MG tablet Commonly known as: ADVIL   insulin glargine-yfgn 100 UNIT/ML Pen Commonly known as: Semglee (yfgn)   labetalol 200 MG tablet Commonly known as: NORMODYNE   losartan 50 MG tablet Commonly known as: COZAAR   metFORMIN 1000 MG tablet Commonly known as: GLUCOPHAGE   methocarbamol 500 MG tablet Commonly known as: ROBAXIN   PRENATAL PO       TAKE these medications    Accu-Chek Guide Me w/Device Kit Use as directed up to four times daily. What changed:  medication strength additional instructions   Accu-Chek Softclix Lancets lancets Use as directed up to four times daily   amLODipine 10 MG tablet Commonly known as: NORVASC Take 1 tablet (10 mg total) by mouth daily. Start taking on: August 07, 2021 What changed:  medication strength how much to take   atorvastatin 40 MG tablet Commonly known as: LIPITOR Take 1 tablet (40 mg total) by mouth daily.   Basaglar KwikPen 100 UNIT/ML Inject 30 Units into the skin at bedtime.   blood glucose meter kit and supplies Dispense based on patient and insurance preference. Use up to four times daily as directed. (FOR ICD-10 E10.9, E11.9).    carvedilol 12.5 MG tablet Commonly known as: COREG Take 1 tablet (12.5 mg total) by mouth 2 (two) times daily with a meal.   glucose blood test strip Use as directed up to four times daily   insulin aspart 100 UNIT/ML FlexPen Commonly known as: NOVOLOG Inject 5 Units into the skin 3 (three) times daily with meals.   Insulin Pen Needle 32G X 4 MM Misc Use as directed up to four times daily What changed: medication strength   losartan-hydrochlorothiazide 50-12.5 MG tablet Commonly known as: Hyzaar Take 1 tablet by mouth daily.   Semaglutide(0.25 or 0.5MG/DOS) 2 MG/1.5ML Sopn Inject 0.25 mg into the skin once a week.   TUMS PO Take 2 tablets by mouth as needed (heartburn).   TYLENOL PO Take 2 tablets by mouth as needed (period cramps).        Disposition and follow-up:   Kim Werner is a 46 y.o. year old female admitted to [facility] for dyspnea and hypertension.  She was treated for flash pulmonary edema secondary to severe symptomatic hypertension.  Her hospital course was complicated by hyperglycemia.  They were discharged from [facility] on hospital day 1 in Good condition.  At the hospital follow up visit please address:  Hypertension LVH Diastolic heart failure - Continue amlodipine 10 mg daily - Continue carvedilol 12.5 mg twice daily with meals - Continue losartan-hydrochlorothiazide 50-12.5 mg once daily - Consider the addition of clonidine 0.2 mg as needed for  severe hypertension - Consider the addition of furosemide 40 mg as needed for 2 to 3 pound weight gain and shortness of breath - Follow-up home blood pressure log - Follow-up with cardiology on 08/13/2021  Type 2 diabetes Severe insulin resistance.  Hemoglobin A1c 12.8. - Continue Basaglar 30 units nightly - Continue NovoLog 5 units 3 times daily with meals - Continue Ozempic 0.25 mg weekly  Hyperlipidemia - Continue atorvastatin 40 mg daily  Normocytic  anemia Chronic.  Normal ferritin.  Borderline low iron.  Labs / imaging needed at time of follow-up: - BMP - CBC  Follow-up Appointments:  08/13/2021 at 10:15 AM: Angelique Blonder, DO, [facility]  08/13/2021 at 2:20 PM: Coletta Memos, NP, [place] [facility] Course by problem list:  Acute hypoxic respiratory failure secondary to flash pulmonary edema Severe symptomatic hypertension Diastolic heart failure Patient initially presented after acute onset dyspnea.  Hypoxic to 84% on room air blood pressure was elevated at 371 systolic.  Chest x-ray revealed vascular congestion with minimal pulmonary edema.  Lasix were administered, nitro drip started, BiPAP initiated.  Her condition improved overnight and by the next morning she was asymptomatic and her blood pressure had returned to normal.  Nitro drip was discontinued and the patient was transitioned to p.o. amlodipine Coreg and hydrochlorothiazide.  She was discharged home with close follow-up in the internal medicine clinic as well as with cardiology.  Type 2 diabetes Severe insulin resistance Patient's blood sugars were as high as 400 capillary blood glucose during admission.  Blood sugar was resistant to multiple doses of NovoLog.  Patient has not had success with her home insulin regimen.  Prior to discharge confirmed with pharmacy the type of insulin that her insurance would cover and discharged her on a new regimen of basal insulin plus short acting insulin for mealtime coverage in addition to a GLP-1 agonist.  AKI Patient's creatinine elevated to 1.18 from 0.8 prior to discharge.  Suspect this is due to severe symptomatic hypertension.  We will repeat a BMP in the outpatient setting.  Discharge Exam:  Subjective: Feeling well.  No shortness of breath.  No chest pain.  No palpitations.  Slept comfortably on her back last night.  Eagerly awaiting discharge.   Blood pressure  120/77, pulse 82, temperature 97.7 F (36.5 C), temperature source Oral, resp. rate 16, height 5' 7"  (1.702 m), weight 117.1 kg, SpO2 99 %. General: Well-developed female in no apparent distress. Cardiovascular: Regular rate and rhythm. Respiratory: Lungs clear to auscultation bilaterally.  Normal work of breathing. Extremities: Left radial pulse 2+. Skin: Skin warm and dry. Neuro: Alert and oriented. Psych: Pleasant.  Appropriate mood and affect.  Pertinent studies and procedures:  CXR: Vascular congestion with minimal pulmonary edema   Latest Reference Range & Units 07/21/Werner 02:29  Sodium 135 - 145 mmol/L 135  Potassium 3.5 - 5.1 mmol/L 3.5  Chloride 98 - 111 mmol/L 98  CO2 22 - 32 mmol/L 25  Glucose 70 - 99 mg/dL 287 (H)  Mean Plasma Glucose mg/dL 320.66  BUN 6 - 20 mg/dL 33 (H)  Creatinine 0.44 - 1.00 mg/dL 1.18 (H)  Calcium 8.9 - 10.3 mg/dL 9.9  Anion gap 5 - 15  12  GFR, Estimated >60 mL/min 59 (L)  (H): Data is abnormally high (L): Data is abnormally low   Latest Reference Range & Units 07/21/Werner 02:29  WBC 4.0 - 10.5 K/uL 16.0 (H)  RBC 3.87 - 5.11  MIL/uL 5.44 (H)  Hemoglobin 12.0 - 15.0 g/dL 11.0 (L)  HCT 36.0 - 46.0 % 37.2  MCV 80.0 - 100.0 fL 68.4 (L)  MCH 26.0 - 34.0 pg 20.2 (L)  MCHC 30.0 - 36.0 g/dL 29.6 (L)  RDW 11.5 - 15.5 % 19.0 (H)  Platelets 150 - 400 K/uL 335  nRBC 0.0 - 0.2 % 0.0  (H): Data is abnormally high (L): Data is abnormally low   Latest Reference Range & Units 07/21/Werner 02:29  Iron 28 - 170 ug/dL 26 (L)  UIBC ug/dL 381  TIBC 250 - 450 ug/dL 407  Saturation Ratios 10.4 - 31.8 % 6 (L)  Ferritin 11 - 307 ng/mL 20  (L): Data is abnormally low   Latest Reference Range & Units 07/21/Werner 02:29  Glucose 70 - 99 mg/dL 287 (H)  Hemoglobin A1C 4.8 - 5.6 % 12.8 (H)  (H): Data is abnormally high  Discharge Instructions:   Discharge Instructions      Ms. Bolding, It was a pleasure taking care of you at [place] were admitted  for severely high blood pressure causing you to have shortness of breath. You were treated with IV Lasix and started on blood pressure medications. Your blood sugar was also very high so we made some changes to your insulin. We are discharging you home now that you are doing better. Please follow the following instructions.  1) For your blood pressure, take carvedilol 12.5 mg twice daily, amlodipine 10 mg daily, and losartan-HCTZ (Hyzaar) 50-12.5 mg daily 2) For your diabetes, inject Basaglar 30 units at bedtime, NovoLog 5 units 3 times with meals and Ozempic 0.25 mg once weekly.  3) Follow-up with the Internal Medicine Clinic and Cardiology next Friday, 7/28 4) Reduce your intake of fatty foods and foods high in starch. Check your blood pressure daily and bring the log to your hospital follow up next week.   Take care,  Dr. Linwood Dibbles, MD, MPH     Signed: Nani Gasser MD 08/06/2021, 3:40 PM   Pager: [phone]

## 2021-08-06 NOTE — Inpatient Diabetes Management (Signed)
Inpatient Diabetes Program Recommendations  AACE/ADA: New Consensus Statement on Inpatient Glycemic Control (2015)  Target Ranges:  Prepandial:   less than 140 mg/dL      Peak postprandial:   less than 180 mg/dL (1-2 hours)      Critically ill patients:  140 - 180 mg/dL   Lab Results  Component Value Date   GLUCAP 278 (H) 08/06/2021   HGBA1C 12.8 (H) 08/06/2021    Dr. Kirke Corin reached out requesting Basaglar and GLP-1 education and to ensure she is aware how to administer.  Asked for pharmacy to run benefit check on Ozempic is coverage at 100%.  Spoke with patient at bedside.  She return demonstrated basal insulin administration.  She has been on Ozempic in the past (approximately 4 years ago) and is aware of how to administer weekly.    She will follow up with Dr. Oswaldo Done outpatient.    Will continue to follow while inpatient.  Thank you, Dulce Sellar, MSN, CDCES Diabetes Coordinator Inpatient Diabetes Program [phone] (team pager from 8a-5p)

## 2021-08-06 NOTE — Progress Notes (Signed)
Mobility Specialist Progress Note    08/06/21 1558  Mobility  Activity Ambulated independently in hallway  Level of Assistance Independent  Assistive Device None  Distance Ambulated (ft) 450 ft  Activity Response Tolerated well  $Mobility charge 1 Mobility   Pre-Mobility: 93 HR, 138/84 BP During Mobility: 106 HR Post-Mobility: 88 HR  Pt received in bed and agreeable. No complaints on walk. Returned to bed with call bell in reach.    [place] Mobility Specialist

## 2021-08-06 NOTE — Progress Notes (Deleted)
Name: Kim Werner MRN: [id number] DOB: [birth year] 46 y.o. PCP: Pcp, No  Date of Admission: 08/05/2021  4:27 AM Date of Discharge: 08/06/2021 3:40 PM Attending Physician: Axel Filler, *  Discharge Diagnosis: Principal Problem:   Hypertensive emergency Active Problems:   Acute on chronic diastolic CHF (congestive heart failure) ([place])   Type 2 diabetes mellitus without complications (HCC)   Hyperlipidemia   Obesity, Class III, BMI 40-49.9 (morbid obesity) ([place])   Mild chronic anemia   Discharge Medications: Allergies as of 08/06/2021   No Known Allergies      Medication List     STOP taking these medications    cholecalciferol 25 MCG (1000 UNIT) tablet Commonly known as: VITAMIN D3   ferrous sulfate 325 (65 FE) MG tablet   ibuprofen 600 MG tablet Commonly known as: ADVIL   insulin glargine-yfgn 100 UNIT/ML Pen Commonly known as: Semglee (yfgn)   labetalol 200 MG tablet Commonly known as: NORMODYNE   losartan 50 MG tablet Commonly known as: COZAAR   metFORMIN 1000 MG tablet Commonly known as: GLUCOPHAGE   methocarbamol 500 MG tablet Commonly known as: ROBAXIN   PRENATAL PO       TAKE these medications    Accu-Chek Guide Me w/Device Kit Use as directed up to four times daily. What changed:  medication strength additional instructions   Accu-Chek Softclix Lancets lancets Use as directed up to four times daily   amLODipine 10 MG tablet Commonly known as: NORVASC Take 1 tablet (10 mg total) by mouth daily. Start taking on: August 07, 2021 What changed:  medication strength how much to take   atorvastatin 40 MG tablet Commonly known as: LIPITOR Take 1 tablet (40 mg total) by mouth daily.   Basaglar KwikPen 100 UNIT/ML Inject 30 Units into the skin at bedtime.   blood glucose meter kit and supplies Dispense based on patient and insurance preference. Use up to four times daily as directed. (FOR ICD-10 E10.9, E11.9).    carvedilol 12.5 MG tablet Commonly known as: COREG Take 1 tablet (12.5 mg total) by mouth 2 (two) times daily with a meal.   glucose blood test strip Use as directed up to four times daily   insulin aspart 100 UNIT/ML FlexPen Commonly known as: NOVOLOG Inject 5 Units into the skin 3 (three) times daily with meals.   Insulin Pen Needle 32G X 4 MM Misc Use as directed up to four times daily What changed: medication strength   losartan-hydrochlorothiazide 50-12.5 MG tablet Commonly known as: Hyzaar Take 1 tablet by mouth daily.   Semaglutide(0.25 or 0.5MG/DOS) 2 MG/1.5ML Sopn Inject 0.25 mg into the skin once a week.   TUMS PO Take 2 tablets by mouth as needed (heartburn).   TYLENOL PO Take 2 tablets by mouth as needed (period cramps).        Disposition and follow-up:   [name] is a 46 y.o. year old female admitted to [facility] for dyspnea and hypertension.  She was treated for flash pulmonary edema secondary to severe symptomatic hypertension.  Her hospital course was complicated by hyperglycemia.  They were discharged from [facility] on hospital day 1 in Good condition.  At the hospital follow up visit please address:  Hypertension LVH Diastolic heart failure - Continue amlodipine 10 mg daily - Continue carvedilol 12.5 mg twice daily with meals - Continue losartan-hydrochlorothiazide 50-12.5 mg once daily - Consider the addition of clonidine 0.2 mg as needed for  severe hypertension - Consider the addition of furosemide 40 mg as needed for 2 to 3 pound weight gain and shortness of breath - Follow-up home blood pressure log - Follow-up with cardiology on 08/13/2021  Type 2 diabetes Severe insulin resistance.  Hemoglobin A1c 12.8. - Continue Basaglar 30 units nightly - Continue NovoLog 5 units 3 times daily with meals - Continue Ozempic 0.25 mg weekly  Hyperlipidemia - Continue atorvastatin 40 mg daily  Normocytic  anemia Chronic.  Normal ferritin.  Borderline low iron.  Labs / imaging needed at time of follow-up: - BMP - CBC  Follow-up Appointments:  08/13/2021 at 10:15 AM: Angelique Blonder, DO, [facility]  08/13/2021 at 2:20 PM: Coletta Memos, NP, [place] [facility] care [facility] Course by problem list:  Acute hypoxic respiratory failure secondary to flash pulmonary edema Severe symptomatic hypertension Diastolic heart failure Patient initially presented after acute onset dyspnea.  Hypoxic to 84% on room air blood pressure was elevated at 295 systolic.  Chest x-ray revealed vascular congestion with minimal pulmonary edema.  Lasix were administered, nitro drip started, BiPAP initiated.  Her condition improved overnight and by the next morning she was asymptomatic and her blood pressure had returned to normal.  Nitro drip was discontinued and the patient was transitioned to p.o. amlodipine Coreg and hydrochlorothiazide.  She was discharged home with close follow-up in the internal medicine clinic as well as with cardiology.  Type 2 diabetes Severe insulin resistance Patient's blood sugars were as high as 400 capillary blood glucose during admission.  Blood sugar was resistant to multiple doses of NovoLog.  Patient has not had success with her home insulin regimen.  Prior to discharge confirmed with pharmacy the type of insulin that her insurance would cover and discharged her on a new regimen of basal insulin plus short acting insulin for mealtime coverage in addition to a GLP-1 agonist.  AKI Patient's creatinine elevated to 1.18 from 0.8 prior to discharge.  Suspect this is due to severe symptomatic hypertension.  We will repeat a BMP in the outpatient setting.  Discharge Exam:  Subjective: Feeling well.  No shortness of breath.  No chest pain.  No palpitations.  Slept comfortably on her back last night.  Eagerly awaiting discharge.   Blood pressure  120/77, pulse 82, temperature 97.7 F (36.5 C), temperature source Oral, resp. rate 16, height 5' 7"  (1.702 m), weight 117.1 kg, SpO2 99 %. General: Well-developed female in no apparent distress. Cardiovascular: Regular rate and rhythm. Respiratory: Lungs clear to auscultation bilaterally.  Normal work of breathing. Extremities: Left radial pulse 2+. Skin: Skin warm and dry. Neuro: Alert and oriented. Psych: Pleasant.  Appropriate mood and affect.  Pertinent studies and procedures:  CXR: Vascular congestion with minimal pulmonary edema   Latest Reference Range & Units 08/06/21 02:29  Sodium 135 - 145 mmol/L 135  Potassium 3.5 - 5.1 mmol/L 3.5  Chloride 98 - 111 mmol/L 98  CO2 22 - 32 mmol/L 25  Glucose 70 - 99 mg/dL 287 (H)  Mean Plasma Glucose mg/dL 320.66  BUN 6 - 20 mg/dL 33 (H)  Creatinine 0.44 - 1.00 mg/dL 1.18 (H)  Calcium 8.9 - 10.3 mg/dL 9.9  Anion gap 5 - 15  12  GFR, Estimated >60 mL/min 59 (L)  (H): Data is abnormally high (L): Data is abnormally low   Latest Reference Range & Units 08/06/21 02:29  WBC 4.0 - 10.5 K/uL 16.0 (H)  RBC 3.87 - 5.11  MIL/uL 5.44 (H)  Hemoglobin 12.0 - 15.0 g/dL 11.0 (L)  HCT 36.0 - 46.0 % 37.2  MCV 80.0 - 100.0 fL 68.4 (L)  MCH 26.0 - 34.0 pg 20.2 (L)  MCHC 30.0 - 36.0 g/dL 29.6 (L)  RDW 11.5 - 15.5 % 19.0 (H)  Platelets 150 - 400 K/uL 335  nRBC 0.0 - 0.2 % 0.0  (H): Data is abnormally high (L): Data is abnormally low   Latest Reference Range & Units 08/06/21 02:29  Iron 28 - 170 ug/dL 26 (L)  UIBC ug/dL 381  TIBC 250 - 450 ug/dL 407  Saturation Ratios 10.4 - 31.8 % 6 (L)  Ferritin 11 - 307 ng/mL 20  (L): Data is abnormally low   Latest Reference Range & Units 08/06/21 02:29  Glucose 70 - 99 mg/dL 287 (H)  Hemoglobin A1C 4.8 - 5.6 % 12.8 (H)  (H): Data is abnormally high  Discharge Instructions:   Discharge Instructions      Ms. Trego, It was a pleasure taking care of you at [place] were admitted  for severely high blood pressure causing you to have shortness of breath. You were treated with IV Lasix and started on blood pressure medications. Your blood sugar was also very high so we made some changes to your insulin. We are discharging you home now that you are doing better. Please follow the following instructions.  1) For your blood pressure, take carvedilol 12.5 mg twice daily, amlodipine 10 mg daily, and losartan-HCTZ (Hyzaar) 50-12.5 mg daily 2) For your diabetes, inject Basaglar 30 units at bedtime, NovoLog 5 units 3 times with meals and Ozempic 0.25 mg once weekly.  3) Follow-up with the Internal Medicine Clinic and Cardiology next Friday, 7/28 4) Reduce your intake of fatty foods and foods high in starch. Check your blood pressure daily and bring the log to your hospital follow up next week.   Take care,  Dr. [place] Dibbles, MD, MPH     Signed: Nani Gasser MD 08/06/2021, 3:40 PM   Pager: [phone]

## 2021-08-06 NOTE — TOC Benefit Eligibility Note (Addendum)
Patient Product/process development scientist completed.    The patient is currently admitted and upon discharge could be taking Basaglar Kwik Pen.  The current 30 day co-pay is, $0.00.   The patient is currently admitted and upon discharge could be taking Ozempic Pen.  The current 30 day co-pay is, $0.00.   The patient is currently admitted and upon discharge could be taking Lantus Pen.  No Covered  The patient is currently admitted and upon discharge could be taking Wegovy Pen.  Requires Prior Authorization  The patient is insured through [employer]    Roland Earl, CPhT Pharmacy Patient Advocate Specialist [facility] Patient Advocate Team Direct Number: [phone]  Fax: [phone]

## 2021-08-06 NOTE — Telephone Encounter (Addendum)
Pharmacy Patient Advocate Encounter  Insurance verification completed.    The patient is insured through [employer]   The patient is currently admitted and ran test claims for the following: Lantus, Basaglar, [place], XJOITG.  Copays and coinsurance results were relayed to Inpatient clinical team.

## 2021-08-11 NOTE — Progress Notes (Signed)
Cardiology Clinic Note   Patient Name: [name] Date of Encounter: 08/13/2021  Primary Care Provider:  Pcp, No Primary Cardiologist:  Glenetta Hew, MD  Patient Profile    Kim Werner 46 year old female presents to the clinic today for follow-up evaluation of her hypertension.  Past Medical History    Past Medical History:  Diagnosis Date   Diabetes mellitus without complication ([place])    Hypertension    History reviewed. No pertinent surgical history.  Allergies  No Known Allergies  History of Present Illness    Kim Werner is a PMH of hypertensive urgency, acute on chronic diastolic CHF, type 2 diabetes, chest discomfort, hyperlipidemia, class III obesity, and mild chronic anemia.  She presented to the hospital on 08/05/2021 and was discharged on 08/06/2021.  She presented with increased dyspnea and hypertension.  She was treated for flash pulmonary edema secondary to her severe hypertension.  Her admission was complicated by hyperglycemia.  Her amlodipine, carvedilol, losartan, hydrochlorothiazide were continued.  It was recommended that clonidine and furosemide also be considered in the outpatient setting.  She was instructed to maintain a blood pressure log and follow-up with cardiology as an outpatient.  Her A1c was noted to be 12.8.  She was continued on insulin and Ozempic.  Her chest x-ray showed vascular congestion versus minimal pulmonary edema.  She received furosemide and was started on BiPAP.  Her condition improved overnight and the next day she was asymptomatic.  Her blood pressure had returned to normal.  Her nitro gtt. was discontinued.  She was transitioned to p.o. amlodipine, carvedilol, and hydrochlorothiazide.  Her admission glucose was 400.  Her blood sugar was resistant to multiple doses of insulin.  She reported not having success with her home insulin regimen.  She was discharged with new orders for basal insulin plus short acting insulin  for her mealtime coverage and GLP-1 agonist.  On admission her creatinine was elevated at 1.18 from her baseline of 0.8.  It was felt that the severe hypertension was contributing to elevated creatinine levels.  She was discharged in good condition.  She presents to the clinic today for follow-up evaluation states she feels well today.  She has noted some slight lightheadedness with doing laundry and bending down to pick up close.  Her blood pressure is well controlled.  We reviewed her recent hospitalization and she expressed understanding.  She is working on eliminating carbohydrates and being more physically active.  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  I will give her the salty 6 diet sheet, give her a weight log, have her increase her physical activity as tolerated and plan follow-up in 3 to 4 months.  Today she denies chest pain, shortness of breath, lower extremity edema, fatigue, palpitations, melena, hematuria, hemoptysis, diaphoresis, weakness, presyncope, syncope, orthopnea, and PND.    Home Medications    Prior to Admission medications   Medication Sig Start Date End Date Taking? Authorizing Provider  Accu-Chek Softclix Lancets lancets Use as directed up to four times daily 08/06/21   Lacinda Axon, MD  Acetaminophen (TYLENOL PO) Take 2 tablets by mouth as needed (period cramps).    [provider]  amLODipine (NORVASC) 10 MG tablet Take 1 tablet (10 mg total) by mouth daily. 08/07/21   Lacinda Axon, MD  atorvastatin (LIPITOR) 40 MG tablet Take 1 tablet (40 mg total) by mouth daily. 08/06/21   Lacinda Axon, MD  Blood Glucose  Monitoring Suppl (ACCU-CHEK GUIDE ME) w/Device KIT Use as directed up to four times daily. 08/06/21   Lacinda Axon, MD  blood glucose meter kit and supplies Dispense based on patient and insurance preference. Use up to four times daily as directed. (FOR ICD-10 E10.9, E11.9). 08/06/21   Lacinda Axon, MD   Calcium Carbonate Antacid (TUMS PO) Take 2 tablets by mouth as needed (heartburn).    [provider]  carvedilol (COREG) 12.5 MG tablet Take 1 tablet (12.5 mg total) by mouth 2 (two) times daily with a meal. 08/06/21   A[place]ons[place], Charisse March, MD  glucose blood test strip Use as directed up to four times daily 08/06/21   Lacinda Axon, MD  insulin aspart (NOVOLOG) 100 UNIT/ML FlexPen Inject 5 Units into the skin 3 (three) times daily with meals. 08/06/21   Lacinda Axon, MD  Insulin Glargine (BASAGLAR KWIKPEN) 100 UNIT/ML Inject 30 Units into the skin at bedtime. 08/06/21   Lacinda Axon, MD  Insulin Pen Needle 32G X 4 MM MISC Use as directed up to four times daily 08/06/21   Lacinda Axon, MD  losartan-hydrochlorothiazide (HYZAAR) 50-12.5 MG tablet Take 1 tablet by mouth daily. 08/06/21 08/06/22  Lacinda Axon, MD  Semaglutide,0.25 or 0.5MG /DOS, 2 MG/1.5ML SOPN Inject 0.25 mg into the skin once a week. 08/06/21   Lacinda Axon, MD    Family History    History reviewed. No pertinent family history. has no family status information on file.   Social History    Social History   Socioeconomic History   Marital status: Married    Spouse name: Not on file   Number of children: Not on file   Years of education: Not on file   Highest education level: Not on file  Occupational History   Not on file  Tobacco Use   Smoking status: Never   Smokeless tobacco: Never  Vaping Use   Vaping Use: Never used  Substance and Sexual Activity   Alcohol use: Never   Drug use: Never   Sexual activity: Yes  Other Topics Concern   Not on file  Social History Narrative   Not on file   Social Determinants of Health   Financial Resource Strain: Not on file  Food Insecurity: Not on file  Transportation Needs: Not on file  Physical Activity: Not on file  Stress: Not on file  Social Connections: Not on file  Intimate Partner Violence: Not on file     Review of  Systems    General:  No chills, fever, night sweats or weight changes.  Cardiovascular:  No chest pain, dyspnea on exertion, edema, orthopnea, palpitations, paroxysmal nocturnal dyspnea. Dermatological: No rash, lesions/masses Respiratory: No cough, dyspnea Urologic: No hematuria, dysuria Abdominal:   No nausea, vomiting, diarrhea, bright red blood per rectum, melena, or hematemesis Neurologic:  No visual changes, wkns, changes in mental status. All other systems reviewed and are otherwise negative except as noted above.  Physical Exam    VS:  BP 135/89   Pulse 90   Ht 5\' 7"  (1.702 m)   Wt 265 lb 1.6 oz (120.2 kg)   SpO2 99%   BMI 41.52 kg/m  , BMI Body mass index is 41.52 kg/m. GEN: Well nourished, well developed, in no acute distress. HEENT: normal. Neck: Supple, no JVD, carotid bruits, or masses. Cardiac: RRR, no murmurs, rubs, or gallops. No clubbing, cyanosis, edema.  Radials/DP/PT 2+ and equal bilaterally.  Respiratory:  Respirations  regular and unlabored, clear to auscultation bilaterally. GI: Soft, nontender, nondistended, BS + x 4. MS: no deformity or atrophy. Skin: warm and dry, no rash. Neuro:  Strength and sensation are intact. Psych: Normal affect.  Accessory Clinical Findings    Recent Labs: 03/27/2021: Magnesium 2.1 08/05/2021: ALT 31; B Natriuretic Peptide 135.6 08/06/2021: BUN 33; Creatinine, Ser 1.18; Hemoglobin 11.0; Platelets 335; Potassium 3.5; Sodium 135   Recent Lipid Panel    Component Value Date/Time   CHOL 215 (H) 03/27/2021 0139   TRIG 85 03/27/2021 0139   HDL 49 03/27/2021 0139   CHOLHDL 4.4 03/27/2021 0139   VLDL 17 03/27/2021 0139   LDLCALC 149 (H) 03/27/2021 0139    ECG personally reviewed by me today-none today.  Echocardiogram 08/05/2021 IMPRESSIONS     1. Left ventricular ejection fraction, by estimation, is 50 to 55%. The  left ventricle has low normal function. The left ventricle has no regional  wall motion abnormalities.  There is severe concentric left ventricular  hypertrophy. Indeterminate  diastolic filling due to E-A fusion.   2. Right ventricular systolic function is normal. The right ventricular  size is normal.   3. The mitral valve is normal in structure. Trivial mitral valve  regurgitation. No evidence of mitral stenosis.   4. The aortic valve is tricuspid. Aortic valve regurgitation is trivial.  No aortic stenosis is present.   5. Aortic dilatation noted. There is mild dilatation of the ascending  aorta, measuring 37 mm.   6. The inferior vena cava is normal in size with greater than 50%  respiratory variability, suggesting right atrial pressure of 3 mmHg.  Assessment & Plan   1.  Essential hypertension-BP today 135/89.  Continues to be well controlled at home.  Recent hospital admission felt to be related to increased fluid volume versus diastolic CHF. Continue amlodipine, carvedilol, hydrochlorothiazide, losartan Heart healthy low-sodium diet-salty 6 given Increase physical activity as tolerated Maintain blood pressure log BMP-drawn by internal medicine  Diastolic CHF-no increased DOE or activity intolerance.  Weight stable.  NYHA class I-2.  Echocardiogram 08/05/2021 showed LVEF of 50-55% severe concentric LVH and aortic dilation measuring 37 mm. Continue carvedilol, hydrochlorothiazide Start furosemide 20 mg for lower extremity swelling and a weight increase of 2 to 3 pounds overnight or 5 pounds in weight. Heart healthy low-sodium diet-salty 6 given Increase physical activity as tolerated   Type 2 diabetes-A1c 12.8 on 08/06/2021.  Reports compliance with blood sugar checks and insulin. Continue current medical therapy. Heart healthy low-sodium carb modified diet Increase physical activity as tolerated  Obesity-weight today 265 lbs.  Working on more strict diet and increasing physical activity. Continue weight loss Continue heart healthy low-sodium carb modified diet Continue  Ozempic  Disposition: Follow-up with Dr. Ellyn Hack in 3-4 months.   Jossie Ng. [place] Langstaff NP-C     08/13/2021, 2:49 PM [address] [place] [address] Office [phone] Fax [phone]  Notice: This dictation was prepared with Dragon dictation along with smaller phrase technology. Any transcriptional errors that result from this process are unintentional and may not be corrected upon review.  I spent 13 minutes examining this patient, reviewing medications, and using patient centered shared decision making involving her cardiac care.  Prior to her visit I spent greater than 20 minutes reviewing her past medical history,  medications, and prior cardiac tests.

## 2021-08-13 ENCOUNTER — Encounter: Payer: Self-pay | Admitting: Student

## 2021-08-13 ENCOUNTER — Other Ambulatory Visit: Payer: Self-pay

## 2021-08-13 ENCOUNTER — Ambulatory Visit (INDEPENDENT_AMBULATORY_CARE_PROVIDER_SITE_OTHER): Payer: BC Managed Care – PPO | Admitting: Student

## 2021-08-13 ENCOUNTER — Encounter: Payer: Self-pay | Admitting: General Practice

## 2021-08-13 ENCOUNTER — Ambulatory Visit (INDEPENDENT_AMBULATORY_CARE_PROVIDER_SITE_OTHER): Payer: BC Managed Care – PPO | Admitting: General Practice

## 2021-08-13 VITALS — BP 135/89 | HR 90 | Ht 67.0 in | Wt 265.1 lb

## 2021-08-13 VITALS — BP 142/95 | HR 86 | Temp 98.1°F | Ht 67.0 in | Wt 267.2 lb

## 2021-08-13 DIAGNOSIS — D649 Anemia, unspecified: Secondary | ICD-10-CM | POA: Diagnosis not present

## 2021-08-13 DIAGNOSIS — I1A0 Resistant hypertension: Secondary | ICD-10-CM | POA: Insufficient documentation

## 2021-08-13 DIAGNOSIS — E119 Type 2 diabetes mellitus without complications: Secondary | ICD-10-CM

## 2021-08-13 DIAGNOSIS — I5031 Acute diastolic (congestive) heart failure: Secondary | ICD-10-CM | POA: Diagnosis not present

## 2021-08-13 DIAGNOSIS — I10 Essential (primary) hypertension: Secondary | ICD-10-CM

## 2021-08-13 DIAGNOSIS — I119 Hypertensive heart disease without heart failure: Secondary | ICD-10-CM

## 2021-08-13 DIAGNOSIS — I110 Hypertensive heart disease with heart failure: Secondary | ICD-10-CM | POA: Diagnosis not present

## 2021-08-13 DIAGNOSIS — Z7984 Long term (current) use of oral hypoglycemic drugs: Secondary | ICD-10-CM

## 2021-08-13 DIAGNOSIS — I422 Other hypertrophic cardiomyopathy: Secondary | ICD-10-CM

## 2021-08-13 DIAGNOSIS — I5033 Acute on chronic diastolic (congestive) heart failure: Secondary | ICD-10-CM | POA: Diagnosis not present

## 2021-08-13 DIAGNOSIS — Z794 Long term (current) use of insulin: Secondary | ICD-10-CM

## 2021-08-13 MED ORDER — LOSARTAN POTASSIUM-HCTZ 100-25 MG PO TABS: 1.0000 | ORAL_TABLET | Freq: Every day | ORAL | 11 refills | Status: DC

## 2021-08-13 MED ORDER — EMPAGLIFLOZIN 10 MG PO TABS: 10.0000 mg | ORAL_TABLET | Freq: Every day | ORAL | 11 refills | Status: DC

## 2021-08-13 NOTE — Patient Instructions (Addendum)
Thank you, [name] for allowing [place] to provide your care today. Today we discussed hospital follow-up for heart failure, blood pressure and diabetes.  -Start Jardiance 10 mg daily for both heart and diabetes. -Increase Losartan-HCTZ to 100-25 mg daily.  -Start Ozempic 0.25 mg weekly. -Continue with your insulin and check your  blood sugar regularly. Please bring your meter at next visit.  -Blood work today, I will call with results.      I have ordered the following labs for you:   Lab Orders         BMP8+Anion Gap         CBC no Diff       Referrals ordered today:   Referral Orders  No referral(s) requested today     I have ordered the following medication/changed the following medications:   Stop the following medications: Medications Discontinued During This Encounter  Medication Reason   losartan-hydrochlorothiazide (HYZAAR) 50-12.5 MG tablet      Start the following medications: Meds ordered this encounter  Medications   empagliflozin (JARDIANCE) 10 MG TABS tablet    Sig: Take 1 tablet (10 mg total) by mouth daily before breakfast.    Dispense:  30 tablet    Refill:  11   losartan-hydrochlorothiazide (HYZAAR) 100-25 MG tablet    Sig: Take 1 tablet by mouth daily.    Dispense:  30 tablet    Refill:  11     Follow up:  2 weeks    Remember: Please remember to bring your glucose meter at your next clinic visit.   Should you have any questions or concerns please call the internal medicine clinic at [phone].    Rana Snare, D.O. [facility]

## 2021-08-13 NOTE — Assessment & Plan Note (Signed)
Iron/TIBC/Ferritin/ %Sat    Component Value Date/Time   IRON 26 (L) 08/06/2021 0229   TIBC 407 08/06/2021 0229   FERRITIN 20 08/06/2021 0229   IRONPCTSAT 6 (L) 08/06/2021 0229   Patient reports history of IDA which she was taking ferrous sulfate. No longer taking it. Ferritin level and TIBC were normal during hospital stay.  Plan -repeat CBC today

## 2021-08-13 NOTE — Assessment & Plan Note (Addendum)
A1c 12.8 during hospital visit. Discharged on glargine 30 units qhs, novolog 5 units TID with meals, and semaglutide 0.25 mg weekly. She has a glucometer and checks her sugar at least 3 times a day. Did not bring her meter to visit today. Reports fasting glucose around 190-220s and postprandial glucose ~300s. Denies any hypoglycemic episodes. After discharge, she has changed her eating habits for more leaner meats and vegetables. Drinking more water instead of sodas. She is motivated to lose weight.   Discussed adding on SGLT2 inhibitor for DM2 and CHF management. Patient agrees to to try it and start taking the ozempic this week. Advised patient to bring meter at next visit to review if adjustments are needed for her insulin regimen. Next visit we can discuss more about weight loss and if she needs additional resources.   Plan -continue insulin regimen -start ozempic 0.25 mg weekly -start jardiance 10 mg daily -f/u in 2 weeks

## 2021-08-13 NOTE — Assessment & Plan Note (Addendum)
Vitals:   08/13/21 1106  BP: (!) 142/95  Pulse: 86  Temp: 98.1 F (36.7 C)  SpO2: 98%   BP elevated today. She is on medications concomitantly for her diastolic HF. Losartan-HCTZ 50-12.5 mg, amlodipine 10 mg, coreg 12.5 mg BID. Denies any symptoms at this visit. Plan to increase her losartan-HCTZ to improve BP control.   Plan -increase losartan-HCTZ to 100-25 mg daily -continue amlodipine and coreg -reassess BP at next visit  -repeat BMP and CBC today

## 2021-08-13 NOTE — Progress Notes (Signed)
CC: Hospital F/u, Establish care  HPI:  [name] is a 46 y.o. female living with a history stated below and presents today for hospital f/u and establish care at clinic. Please see problem based assessment and plan for additional details.  PMH: -T2DM -HLD -HTN -diastolic HF -IDA -Hx of TIA (2018 at [facility])  PSH: -breast biopsy  Allergies: NKDA  FH: -mother: HTN, DM -father: healthy -1 sister and 4 brothers: all healthy -1 daughter: healthy  SH: -denies tobacco use -occasional EtOH use -denies illicit drug use -works as [employer] -lives with husband and 2 children  -recently moved to [place] from [place]   Current Outpatient Medications on File Prior to Visit  Medication Sig Dispense Refill   Accu-Chek Softclix Lancets lancets Use as directed up to four times daily 100 each 0   Acetaminophen (TYLENOL PO) Take 2 tablets by mouth as needed (period cramps).     amLODipine (NORVASC) 10 MG tablet Take 1 tablet (10 mg total) by mouth daily. 30 tablet 5   atorvastatin (LIPITOR) 40 MG tablet Take 1 tablet (40 mg total) by mouth daily. 30 tablet 5   blood glucose meter kit and supplies Dispense based on patient and insurance preference. Use up to four times daily as directed. (FOR ICD-10 E10.9, E11.9). 1 each 0   Blood Glucose Monitoring Suppl (ACCU-CHEK GUIDE ME) w/Device KIT Use as directed up to four times daily. 1 kit 0   Calcium Carbonate Antacid (TUMS PO) Take 2 tablets by mouth as needed (heartburn).     carvedilol (COREG) 12.5 MG tablet Take 1 tablet (12.5 mg total) by mouth 2 (two) times daily with a meal. 60 tablet 5   glucose blood test strip Use as directed up to four times daily 100 each 0   insulin aspart (NOVOLOG) 100 UNIT/ML FlexPen Inject 5 Units into the skin 3 (three) times daily with meals. 15 mL 11   Insulin Glargine (BASAGLAR KWIKPEN) 100 UNIT/ML Inject 30 Units into the skin at bedtime. 9 mL 5   Insulin Pen Needle 32G  X 4 MM MISC Use as directed up to four times daily 100 each 5   Semaglutide,0.25 or 0.5MG/DOS, 2 MG/1.5ML SOPN Inject 0.25 mg into the skin once a week. 3 mL 1   No current facility-administered medications on file prior to visit.    Review of Systems: ROS negative except for what is noted on the assessment and plan.  Vitals:   08/13/21 1106  BP: (!) 142/95  Pulse: 86  Temp: 98.1 F (36.7 C)  TempSrc: Oral  SpO2: 98%  Weight: 267 lb 3.2 oz (121.2 kg)  Height: 5' 7"  (1.702 m)   Physical Exam: Constitutional: well-appearing, in no acute distress HENT: normocephalic atraumatic Neck: supple Cardiovascular: regular rate and rhythm, no JVD, no LE edema, 2+ radial pulse bilaterally Pulmonary/Chest: normal work of breathing on RA, lungs clear to auscultation bilaterally Neurological: alert & oriented x 3 Skin: warm and dry Psych: normal mood and behavior  Assessment & Plan:   HTN (hypertension) Vitals:   08/13/21 1106  BP: (!) 142/95  Pulse: 86  Temp: 98.1 F (36.7 C)  SpO2: 98%   BP elevated today. She is on medications concomitantly for her diastolic HF. Losartan-HCTZ 50-12.5 mg, amlodipine 10 mg, coreg 12.5 mg BID. Denies any symptoms at this visit. Plan to increase her losartan-HCTZ to improve BP control.   Plan -increase losartan-HCTZ to 100-25 mg daily -continue amlodipine and  coreg -reassess BP at next visit  -repeat BMP and CBC today   Acute on chronic diastolic CHF (congestive heart failure) [facility]) Patient presents to clinic after hospital visit and to establish care here. She was admitted for flash pulmonary edema 2/2 hypertensive emergency. Discharged on new medication regimen of amlodipine 10 mg, losartan-HCTZ 50-12.5 mg, and coreg 12.5 mg BID. Today she reports feeling better since discharge. Denies SOB, cough, chest pain, peripheral edema or n/v. Given history of uncontrolled DM along with diastolic HF, she would benefit with addition of SGLT2 inhibitor.    Plan -continue amlodipine and coreg -increase losartan-HCTZ 100-25 mg daily -start Jardiance 10 mg daily -f/u with cardiology  Type 2 diabetes mellitus without complications (HCC) B9U 38.3 during hospital visit. Discharged on glargine 30 units qhs, novolog 5 units TID with meals, and semaglutide 0.25 mg weekly. She has a glucometer and checks her sugar at least 3 times a day. Did not bring her meter to visit today. Reports fasting glucose around 190-220s and postprandial glucose ~300s. Denies any hypoglycemic episodes. After discharge, she has changed her eating habits for more leaner meats and vegetables. Drinking more water instead of sodas. She is motivated to lose weight.   Discussed adding on SGLT2 inhibitor for DM2 and CHF management. Patient agrees to to try it and start taking the ozempic this week. Advised patient to bring meter at next visit to review if adjustments are needed for her insulin regimen. Next visit we can discuss more about weight loss and if she needs additional resources.   Plan -continue insulin regimen -start ozempic 0.25 mg weekly -start jardiance 10 mg daily -f/u in 2 weeks   Mild chronic anemia Iron/TIBC/Ferritin/ %Sat    Component Value Date/Time   IRON 26 (L) 08/06/2021 0229   TIBC 407 08/06/2021 0229   FERRITIN 20 08/06/2021 0229   IRONPCTSAT 6 (L) 08/06/2021 0229   Patient reports history of IDA which she was taking ferrous sulfate. No longer taking it. Ferritin level and TIBC were normal during hospital stay.  Plan -repeat CBC today   Patient seen with Dr. Juluis Mire, D.O. [facility], PGY-1 Phone: [phone] Date 08/13/2021 Time 7:50 PM

## 2021-08-13 NOTE — Assessment & Plan Note (Addendum)
Patient presents to clinic after hospital visit and to establish care here. She was admitted for flash pulmonary edema 2/2 hypertensive emergency. Discharged on new medication regimen of amlodipine 10 mg, losartan-HCTZ 50-12.5 mg, and coreg 12.5 mg BID. Today she reports feeling better since discharge. Denies SOB, cough, chest pain, peripheral edema or n/v. Given history of uncontrolled DM along with diastolic HF, she would benefit with addition of SGLT2 inhibitor.   Plan -continue amlodipine and coreg -increase losartan-HCTZ 100-25 mg daily -start Jardiance 10 mg daily -f/u with cardiology

## 2021-08-13 NOTE — Patient Instructions (Signed)
Medication Instructions:  The current medical regimen is effective;  continue present plan and medications as directed. Please refer to the Current Medication list given to you today.   *If you need a refill on your cardiac medications before your next appointment, please call your pharmacy*  Lab Work:   Testing/Procedures:  NONE    NONE  Special Instructions PLEASE READ AND FOLLOW SALTY 6-ATTACHED-1,800mg  daily  PLEASE INCREASE PHYSICAL ACTIVITY AS TOLERATED   TAKE AND LOG YOUR WEIGHT DAILY-Call if you gain 3 pounds in a day -or- 5pounds in a week  Follow-Up: Your next appointment:  3-4 month(s) In Person with Bryan Lemma, MD    At [facility], you and your health needs are our priority.  As part of our continuing mission to provide you with exceptional heart care, we have created designated Provider Care Teams.  These Care Teams include your primary Cardiologist (physician) and Advanced Practice Providers (APPs -  Physician Assistants and Nurse Practitioners) who all work together to provide you with the care you need, when you need it.  Important Information About Sugar             6 SALTY THINGS TO AVOID     1,800MG  DAILY

## 2021-08-14 LAB — BMP8+ANION GAP
Anion Gap: 18 mmol/L (ref 10.0–18.0)
BUN/Creatinine Ratio: 18 (ref 9–23)
BUN: 16 mg/dL (ref 6–24)
CO2: 23 mmol/L (ref 20–29)
Calcium: 9.2 mg/dL (ref 8.7–10.2)
Chloride: 97 mmol/L (ref 96–106)
Creatinine, Ser: 0.91 mg/dL (ref 0.57–1.00)
Glucose: 260 mg/dL — ABNORMAL HIGH (ref 70–99)
Potassium: 4.1 mmol/L (ref 3.5–5.2)
Sodium: 138 mmol/L (ref 134–144)
eGFR: 80 mL/min/{1.73_m2} (ref >59)

## 2021-08-14 LAB — CBC
Hematocrit: 34.4 % (ref 34.0–46.6)
Hemoglobin: 10.7 g/dL — ABNORMAL LOW (ref 11.1–15.9)
MCH: 20.8 pg — ABNORMAL LOW (ref 26.6–33.0)
MCHC: 31.1 g/dL — ABNORMAL LOW (ref 31.5–35.7)
MCV: 67 fL — ABNORMAL LOW (ref 79–97)
Platelets: 439 10*3/uL (ref 150–450)
RBC: 5.15 x10E6/uL (ref 3.77–5.28)
RDW: 17.4 % — ABNORMAL HIGH (ref 11.7–15.4)
WBC: 8.4 10*3/uL (ref 3.4–10.8)

## 2021-08-16 NOTE — Progress Notes (Signed)
Internal Medicine Clinic Attending  I saw and evaluated the patient.  I personally confirmed the key portions of the history and exam documented by Dr. Sherrilee Gilles and I reviewed pertinent patient test results.  The assessment, diagnosis, and plan were formulated together and I agree with the documentation in the resident's note.    Patient here to establish care after recent hospitalization for HTN, HFpEF, and T2DM. Increasing losartan/HCTZ today, labs look good. Starting Jardiance today Close follow up scheduled

## 2021-08-27 ENCOUNTER — Other Ambulatory Visit: Payer: Self-pay | Admitting: Internal Medicine

## 2021-08-27 ENCOUNTER — Ambulatory Visit (INDEPENDENT_AMBULATORY_CARE_PROVIDER_SITE_OTHER): Payer: BC Managed Care – PPO | Admitting: Internal Medicine

## 2021-08-27 ENCOUNTER — Encounter: Payer: Self-pay | Admitting: Internal Medicine

## 2021-08-27 ENCOUNTER — Other Ambulatory Visit: Payer: Self-pay

## 2021-08-27 DIAGNOSIS — Z7984 Long term (current) use of oral hypoglycemic drugs: Secondary | ICD-10-CM | POA: Diagnosis not present

## 2021-08-27 DIAGNOSIS — E119 Type 2 diabetes mellitus without complications: Secondary | ICD-10-CM | POA: Diagnosis not present

## 2021-08-27 DIAGNOSIS — I5033 Acute on chronic diastolic (congestive) heart failure: Secondary | ICD-10-CM

## 2021-08-27 DIAGNOSIS — Z7985 Long-term (current) use of injectable non-insulin antidiabetic drugs: Secondary | ICD-10-CM | POA: Diagnosis not present

## 2021-08-27 DIAGNOSIS — Z794 Long term (current) use of insulin: Secondary | ICD-10-CM

## 2021-08-27 MED ORDER — SEMAGLUTIDE (1 MG/DOSE) 4 MG/3ML ~~LOC~~ SOPN: 1.0000 mg | PEN_INJECTOR | SUBCUTANEOUS | 1 refills | Status: DC

## 2021-08-27 MED ORDER — EMPAGLIFLOZIN 25 MG PO TABS: 25.0000 mg | ORAL_TABLET | Freq: Every day | ORAL | 2 refills | Status: DC

## 2021-08-27 MED ORDER — SEMAGLUTIDE(0.25 OR 0.5MG/DOS) 2 MG/1.5ML ~~LOC~~ SOPN
PEN_INJECTOR | SUBCUTANEOUS | 2 refills | Status: DC
Start: 2021-08-27 — End: 2021-08-27

## 2021-08-27 NOTE — Assessment & Plan Note (Addendum)
The patient presents today for 2-week follow-up of her diabetes.  In July, her A1c was noted to be 12.8% (while she was hospitalized) and she was discharged on insulin glargine 30 units nightly, NovoLog 5 units 3 times daily with meals, and ozempic 0.25 mg weekly.  At her [facility] visit 2 weeks ago, she was also started on Jardiance 10 mg daily, as her fasting blood sugars were still all above goal.   Today, the patient states that she continues to do well with all of her medications. She denies any GI side effects and also denies any symptoms of hypoglycemia. The patient has been checking her blood sugars regularly, and more of them are <180 now, although there are still some that are above 200. She does endorse having some blurry vision, but denies any dizziness, lightheadedness, headaches, tremors, chest pain, shortness of breath, or any other symptoms. Her blurry vision is most noticeable when she is trying to read small, close things (such as her phone). She bought reading glasses, which has helped, but she does not have an optometrist/ophthalmologist, nor has she had an eye exam in the past year.    Plan: - Continue basaglar 30u qhs and novolog 5u tid with meals - Increase ozempic to 0.5 mg per week; in 4 weeks can increase to 1 mg/week - Increase jardiance to 25 mg daily - Diabetic foot exam today - Referral to ophthalmology  - Referral to nutrition and diabetes services Lupita Leash) - Follow up in 2 months for A1c recheck

## 2021-08-27 NOTE — Progress Notes (Signed)
CC: 2 week follow up of diabetes management  HPI:  [name] is a 46 y.o. female living with a history stated below and presents today for a follow up of her diabetes. Please see problem based assessment and plan for additional details.  Past Medical History:  Diagnosis Date   Diabetes mellitus without complication ([place])    Hypertension     Current Outpatient Medications on File Prior to Visit  Medication Sig Dispense Refill   Accu-Chek Softclix Lancets lancets Use as directed up to four times daily 100 each 0   Acetaminophen (TYLENOL PO) Take 2 tablets by mouth as needed (period cramps).     amLODipine (NORVASC) 10 MG tablet Take 1 tablet (10 mg total) by mouth daily. 30 tablet 5   atorvastatin (LIPITOR) 40 MG tablet Take 1 tablet (40 mg total) by mouth daily. 30 tablet 5   blood glucose meter kit and supplies Dispense based on patient and insurance preference. Use up to four times daily as directed. (FOR ICD-10 E10.9, E11.9). 1 each 0   Blood Glucose Monitoring Suppl (ACCU-CHEK GUIDE ME) w/Device KIT Use as directed up to four times daily. 1 kit 0   Calcium Carbonate Antacid (TUMS PO) Take 2 tablets by mouth as needed (heartburn).     carvedilol (COREG) 12.5 MG tablet Take 1 tablet (12.5 mg total) by mouth 2 (two) times daily with a meal. 60 tablet 5   glucose blood test strip Use as directed up to four times daily 100 each 0   insulin aspart (NOVOLOG) 100 UNIT/ML FlexPen Inject 5 Units into the skin 3 (three) times daily with meals. 15 mL 11   Insulin Glargine (BASAGLAR KWIKPEN) 100 UNIT/ML Inject 30 Units into the skin at bedtime. 9 mL 5   Insulin Pen Needle 32G X 4 MM MISC Use as directed up to four times daily 100 each 5   losartan-hydrochlorothiazide (HYZAAR) 100-25 MG tablet Take 1 tablet by mouth daily. 30 tablet 11   No current facility-administered medications on file prior to visit.    No family history on file.  Social History   Socioeconomic History    Marital status: Married    Spouse name: Not on file   Number of children: Not on file   Years of education: Not on file   Highest education level: Not on file  Occupational History   Not on file  Tobacco Use   Smoking status: Never   Smokeless tobacco: Never  Vaping Use   Vaping Use: Never used  Substance and Sexual Activity   Alcohol use: Never   Drug use: Never   Sexual activity: Yes  Other Topics Concern   Not on file  Social History Narrative   Not on file   Social Determinants of Health   Financial Resource Strain: Not on file  Food Insecurity: Not on file  Transportation Needs: Not on file  Physical Activity: Not on file  Stress: Not on file  Social Connections: Not on file  Intimate Partner Violence: Not on file    Review of Systems: ROS negative except for what is noted on the assessment and plan.  Vitals:   08/27/21 1039  BP: 131/77  Pulse: 80  Temp: 98.2 F (36.8 C)  TempSrc: Oral  SpO2: 100%  Weight: 263 lb (119.3 kg)  Height: _0  (1.702 m)    Physical Exam: Constitutional: well-appearing obese female sitting in chair, in no acute distress Cardiovascular: regular rate and rhythm, no  m/r/g Pulmonary/Chest: normal work of breathing on room air, lungs clear to auscultation bilaterally Abdominal: soft, non-tender, normal bowel sounds MSK: normal bulk and tone Neurological: alert & oriented x 3, no focal deficit Skin: warm and dry Psych: normal mood and behavior  Assessment & Plan:     Patient discussed with Dr. Philipp Ovens  Type 2 diabetes mellitus without complications [facility]) The patient presents today for 2-week follow-up of her diabetes.  In July, her A1c was noted to be 12.8% (while she was hospitalized) and she was discharged on insulin glargine 30 units nightly, NovoLog 5 units 3 times daily with meals, and ozempic 0.25 mg weekly.  At her [facility] visit 2 weeks ago, she was also started on Jardiance 10 mg daily, as her fasting blood sugars were  still all above goal.   Today, the patient states that she continues to do well with all of her medications. She denies any GI side effects and also denies any symptoms of hypoglycemia. The patient has been checking her blood sugars regularly, and more of them are <180 now, although there are still some that are above 200. She does endorse having some blurry vision, but denies any dizziness, lightheadedness, headaches, tremors, chest pain, shortness of breath, or any other symptoms. Her blurry vision is most noticeable when she is trying to read small, close things (such as her phone). She bought reading glasses, which has helped, but she does not have an optometrist/ophthalmologist, nor has she had an eye exam in the past year.    Plan: - Continue basaglar 30u qhs and novolog 5u tid with meals - Increase ozempic to 0.5 mg per week; in 4 weeks can increase to 1 mg/week - Increase jardiance to 25 mg daily - Diabetic foot exam today - Referral to ophthalmology  - Referral to nutrition and diabetes services [facility]) - Follow up in 2 months for A1c recheck   Newel Oien, D.O. [facility], PGY-2 Phone: [phone] Date 08/27/2021 Time 11:26 AM

## 2021-08-27 NOTE — Patient Instructions (Addendum)
Thank you, Kim Werner for allowing [place] to provide your care today. Today we discussed:  Diabetes: Your blood sugars are definitely better than when you were last here 2 weeks ago, so keep up the great work! The sugar levels are still high, so we are going to make some changes to your medications today: Please keep taking insulin glargine/basaglar 30 units each night Please keep taking novolog 5 units with each meal (3 times a day) Increase your ozempic to 0.5 mg per week, and after 4 weeks, you can increase to 1 mg per week (both doses will be available at your pharmacy) Increase your jardiance to 25 mg tablet daily (new tablet sent to your pharmacy) We are sending you to an eye doctor- they should call you to set up an appt We did a diabetic foot exam today too  I have ordered the following labs for you:  Lab Orders  No laboratory test(s) ordered today      Referrals ordered today:    Referral Orders         Ambulatory referral to Ophthalmology      I have ordered the following medication/changed the following medications:   Stop the following medications: Medications Discontinued During This Encounter  Medication Reason   Semaglutide,0.25 or 0.5MG /DOS, 2 MG/1.5ML SOPN Reorder   empagliflozin (JARDIANCE) 10 MG TABS tablet Reorder     Start the following medications: Meds ordered this encounter  Medications   Semaglutide,0.25 or 0.5MG /DOS, 2 MG/1.5ML SOPN    Sig: Inject 0.5 mg into the skin once a week for 30 days, THEN 1 mg once a week.    Dispense:  3 mL    Refill:  2   empagliflozin (JARDIANCE) 25 MG TABS tablet    Sig: Take 1 tablet (25 mg total) by mouth daily before breakfast.    Dispense:  30 tablet    Refill:  2     Follow up: 2 months    Remember: To keep checking your blood sugars and bring your meter to each visit! If your blood sugar drops too low, or if you feel shakey/dizzy/ill, please call us and please be sure to check your sugar/get something  to eat  Should you have any questions or concerns please call the internal medicine clinic at [phone].     Elza Rafter, D.O. [facility]

## 2021-08-31 NOTE — Progress Notes (Signed)
Internal Medicine Clinic Attending ° °Case discussed with Dr. Atway  At the time of the visit.  We reviewed the resident’s history and exam and pertinent patient test results.  I agree with the assessment, diagnosis, and plan of care documented in the resident’s note.  °

## 2021-09-27 ENCOUNTER — Ambulatory Visit: Payer: BC Managed Care – PPO | Admitting: Cardiology

## 2021-09-30 ENCOUNTER — Other Ambulatory Visit: Payer: Self-pay

## 2021-09-30 DIAGNOSIS — I5033 Acute on chronic diastolic (congestive) heart failure: Secondary | ICD-10-CM

## 2021-09-30 DIAGNOSIS — E119 Type 2 diabetes mellitus without complications: Secondary | ICD-10-CM

## 2021-10-01 MED ORDER — ATORVASTATIN CALCIUM 40 MG PO TABS: 40.0000 mg | ORAL_TABLET | Freq: Every day | ORAL | 5 refills | Status: DC

## 2021-10-01 MED ORDER — CARVEDILOL 12.5 MG PO TABS: 12.5000 mg | ORAL_TABLET | Freq: Two times a day (BID) | ORAL | 5 refills | Status: DC

## 2021-10-01 MED ORDER — INSULIN ASPART 100 UNIT/ML FLEXPEN: 5.0000 [IU] | PEN_INJECTOR | Freq: Three times a day (TID) | SUBCUTANEOUS | 11 refills | Status: DC

## 2021-10-01 MED ORDER — EMPAGLIFLOZIN 25 MG PO TABS: 25.0000 mg | ORAL_TABLET | Freq: Every day | ORAL | 2 refills | Status: AC

## 2021-10-19 ENCOUNTER — Telehealth: Payer: Self-pay

## 2021-10-19 NOTE — Telephone Encounter (Signed)
Spoke with patient regarding rx for semglee/ozempic patient stated her insurance told her she can get the ozempic but they wont cover for long acting insulin. Please contact the pharmacy regarding her rx.

## 2021-10-22 ENCOUNTER — Other Ambulatory Visit: Payer: Self-pay | Admitting: Student

## 2021-10-22 DIAGNOSIS — E119 Type 2 diabetes mellitus without complications: Secondary | ICD-10-CM

## 2021-10-22 MED ORDER — BASAGLAR KWIKPEN 100 UNIT/ML ~~LOC~~ SOPN: 30.0000 [IU] | PEN_INJECTOR | Freq: Every day | SUBCUTANEOUS | 5 refills | Status: DC

## 2021-10-22 NOTE — Progress Notes (Signed)
Patient called the clinic with concern that her long-acting insulin Semglee was not covered by her insurance. She got her Ozempic though. Per 08/27/21 [facility] visit note with Dr. Penny Pia, patient was on Basaglar 30 units nightly.  I called [facility] on [address] to confirm.  Semglee was prescribed by different provider that is not part of the IMTS.  Will place order for Basaglar 30 u QHS.

## 2021-11-09 ENCOUNTER — Encounter: Payer: BC Managed Care – PPO | Admitting: Student

## 2021-11-09 ENCOUNTER — Encounter: Payer: BC Managed Care – PPO | Admitting: Dietician

## 2021-11-17 ENCOUNTER — Encounter: Payer: Self-pay | Admitting: Student

## 2021-11-17 ENCOUNTER — Ambulatory Visit (INDEPENDENT_AMBULATORY_CARE_PROVIDER_SITE_OTHER): Payer: BC Managed Care – PPO | Admitting: Student

## 2021-11-17 VITALS — BP 182/108 | HR 87 | Temp 97.7°F | Ht 67.0 in | Wt 264.7 lb

## 2021-11-17 DIAGNOSIS — I10 Essential (primary) hypertension: Secondary | ICD-10-CM

## 2021-11-17 DIAGNOSIS — E119 Type 2 diabetes mellitus without complications: Secondary | ICD-10-CM

## 2021-11-17 DIAGNOSIS — Z6841 Body Mass Index (BMI) 40.0 and over, adult: Secondary | ICD-10-CM

## 2021-11-17 DIAGNOSIS — D649 Anemia, unspecified: Secondary | ICD-10-CM

## 2021-11-17 DIAGNOSIS — Z794 Long term (current) use of insulin: Secondary | ICD-10-CM

## 2021-11-17 LAB — GLUCOSE, CAPILLARY: Glucose-Capillary: 149 mg/dL — ABNORMAL HIGH (ref 70–99)

## 2021-11-17 LAB — POCT GLYCOSYLATED HEMOGLOBIN (HGB A1C): Hemoglobin A1C: 8.5 % — AB (ref 4.0–5.6)

## 2021-11-17 MED ORDER — GLUCOSE BLOOD VI STRP: ORAL_STRIP | 0 refills | Status: AC

## 2021-11-17 MED ORDER — ACCU-CHEK SOFTCLIX LANCETS MISC: 0 refills | Status: DC

## 2021-11-17 MED ORDER — OZEMPIC (2 MG/DOSE) 8 MG/3ML ~~LOC~~ SOPN: 2.0000 mg | PEN_INJECTOR | SUBCUTANEOUS | 6 refills | Status: DC

## 2021-11-17 MED ORDER — AMLODIPINE BESYLATE 10 MG PO TABS: 10.0000 mg | ORAL_TABLET | Freq: Every day | ORAL | 11 refills | Status: DC

## 2021-11-17 NOTE — Assessment & Plan Note (Signed)
BP Readings from Last 3 Encounters:  11/17/21 (!) 182/108  08/27/21 131/77  08/13/21 135/89   BP elevated today.  She is on losartan-HCTZ 100-25 mg, amlodipine 10 mg and carvedilol 12.5 mg twice daily. She reports she ran out of amlodipine about 2 months ago.  Upon review, amlodipine was sent to different pharmacy. She is feeling well and denies any chest pains, dyspnea, n/v, blurry vision or peripheral edema.   Plan -Recent amlodipine 10 mg to [place] and carvedilol -Follow-up visit in 2 weeks

## 2021-11-17 NOTE — Patient Instructions (Signed)
Thank you, Kim Werner for allowing [place] to provide your care today.   Blood Pressure -Blood pressure was high today. -Continue losartan-HCTZ and carvedilol. -Refilled amlodipine 10 mg daily to your [place]. -Follow-up visit for blood pressure check in 2 weeks.  Diabetes -Continue Basaglar and NovoLog -Continue Jardiance -Increase Ozempic to 2 mg injection weekly -I refilled your glucose meter supplies to your pharmacy.  Weight Loss -Referral [facility] -Please continue with your healthy eating.  -Blood work today and urine test.  I will call with results.  I have ordered the following labs for you:  Lab Orders         Glucose, capillary         Microalbumin / Creatinine Urine Ratio         CBC no Diff         POC Hbg A1C      Referrals ordered today:   Referral Orders         Amb Referral To Provider Referral Exercise Program (P.R.E.P)      I have ordered the following medication/changed the following medications:   Stop the following medications: Medications Discontinued During This Encounter  Medication Reason   amLODipine (NORVASC) 10 MG tablet Reorder   Semaglutide, 1 MG/DOSE, 4 MG/3ML SOPN    Semaglutide,0.25 or 0.5MG /DOS, (OZEMPIC, 0.25 OR 0.5 MG/DOSE,) 2 MG/1.5ML SOPN    Accu-Chek Softclix Lancets lancets Reorder   glucose blood test strip Reorder     Start the following medications: Meds ordered this encounter  Medications   amLODipine (NORVASC) 10 MG tablet    Sig: Take 1 tablet (10 mg total) by mouth daily.    Dispense:  30 tablet    Refill:  11   Accu-Chek Softclix Lancets lancets    Sig: Use as directed up to four times daily    Dispense:  100 each    Refill:  0   glucose blood test strip    Sig: Use as directed up to four times daily    Dispense:  100 each    Refill:  0   Semaglutide, 2 MG/DOSE, (OZEMPIC, 2 MG/DOSE,) 8 MG/3ML SOPN    Sig: Inject 2 mg into the skin once a week.     Dispense:  6 mL    Refill:  6     Follow up:  2 weeks     Should you have any questions or concerns please call the internal medicine clinic at [phone].    Angelique Blonder, D.O. [facility]

## 2021-11-17 NOTE — Assessment & Plan Note (Signed)
Body mass index is 41.46 kg/m.  Patient would like to try losing weight. She has improved her eating habits after her hospitalization. Discussed incorporating regular physical activity. She would benefit from the [facility]. Patient is in agreement and referral sent today.  Plan -Referral to P.R.E.P

## 2021-11-17 NOTE — Progress Notes (Signed)
CC: HTN and T2DM follow-up  HPI:  [name] is a 46 y.o. female living with a history stated below and presents today for follow-up of HTN and T2DM. Please see problem based assessment and plan for additional details.  Past Medical History:  Diagnosis Date   Diabetes mellitus without complication ([place])    Hypertension     Current Outpatient Medications on File Prior to Visit  Medication Sig Dispense Refill   Acetaminophen (TYLENOL PO) Take 2 tablets by mouth as needed (period cramps).     atorvastatin (LIPITOR) 40 MG tablet Take 1 tablet (40 mg total) by mouth daily. 30 tablet 5   blood glucose meter kit and supplies Dispense based on patient and insurance preference. Use up to four times daily as directed. (FOR ICD-10 E10.9, E11.9). 1 each 0   Blood Glucose Monitoring Suppl (ACCU-CHEK GUIDE ME) w/Device KIT Use as directed up to four times daily. 1 kit 0   Calcium Carbonate Antacid (TUMS PO) Take 2 tablets by mouth as needed (heartburn).     carvedilol (COREG) 12.5 MG tablet Take 1 tablet (12.5 mg total) by mouth 2 (two) times daily with a meal. 60 tablet 5   empagliflozin (JARDIANCE) 25 MG TABS tablet Take 1 tablet (25 mg total) by mouth daily before breakfast. 30 tablet 2   insulin aspart (NOVOLOG) 100 UNIT/ML FlexPen Inject 5 Units into the skin 3 (three) times daily with meals. 15 mL 11   Insulin Glargine (BASAGLAR KWIKPEN) 100 UNIT/ML Inject 30 Units into the skin at bedtime. 9 mL 5   Insulin Pen Needle 32G X 4 MM MISC Use as directed up to four times daily 100 each 5   losartan-hydrochlorothiazide (HYZAAR) 100-25 MG tablet Take 1 tablet by mouth daily. 30 tablet 11   No current facility-administered medications on file prior to visit.   Review of Systems: ROS negative except for what is noted on the assessment and plan.  Vitals:   11/17/21 1557  BP: (!) 182/108  Pulse: 87  Temp: 97.7 F (36.5 C)  TempSrc: Oral  SpO2: 100%  Weight: 264 lb 11.2 oz (120.1 kg)   Height: _0  (1.702 m)   Physical Exam: Constitutional: well-appearing female, sitting in chair, in no acute distress HENT: normocephalic atraumatic Neck: supple Cardiovascular: regular rate and rhythm, no m/r/g Pulmonary/Chest: normal work of breathing on room air, lungs clear to auscultation bilaterally MSK: normal bulk and tone Neurological: alert & oriented x 3 Skin: warm and dry Psych: normal mood and behavior  Assessment & Plan:   HTN (hypertension) BP Readings from Last 3 Encounters:  11/17/21 (!) 182/108  08/27/21 131/77  08/13/21 135/89   BP elevated today.  She is on losartan-HCTZ 100-25 mg, amlodipine 10 mg and carvedilol 12.5 mg twice daily. She reports she ran out of amlodipine about 2 months ago.  Upon review, amlodipine was sent to different pharmacy. She is feeling well and denies any chest pains, dyspnea, n/v, blurry vision or peripheral edema.   Plan -Recent amlodipine 10 mg to [place] and carvedilol -Follow-up visit in 2 weeks  Type 2 diabetes mellitus without complications ([facility]) I1W was 8.5 today from 12.8.  She is on Basaglar 30 units nightly, NovoLog 5 units 3 times daily, Jardiance 25 mg daily and Ozempic 1 mg weekly. Tolerating medications well with no side effects. She measures her glucose daily, fasting glucose averages 90-120.  Denies any symptoms of hypoglycemia. She is doing better and has been  eating healthier since her hospitalization. At next visit, will discuss more regarding CGM options.   Plan -Increase Ozempic to 2 mg weekly if tolerated -Continue Basaglar 30 units at night and NovoLog 5 units 3 times daily -Continue Jardiance 25 mg daily -Refilled meter supplies -Urine microalbumin today  Obesity, Class III, BMI 40-49.9 (morbid obesity) (HCC) Body mass index is 41.46 kg/m.  Patient would like to try losing weight. She has improved her eating habits after her hospitalization. Discussed incorporating  regular physical activity. [facility] would benefit [facility] Patient is in agreement and referral sent today.  Plan -Referral to P.R.E.P  Mild chronic anemia Patient reports hx of IDA which she was taking ferrous sulfate previously. She stopped it when she was hospitalized and did not restart it. Denies hematuria, hematochezia, melena or other bleeding. She still has menstrual cycles. Will consider restarting her iron supplementation.   Plan -CBC today   Patient seen with Dr. Andris Baumann, D.O. [facility], PGY-1 Phone: [phone] Date 11/17/2021 Time 7:25 PM

## 2021-11-17 NOTE — Assessment & Plan Note (Signed)
A1c was 8.5 today from 12.8.  She is on Basaglar 30 units nightly, NovoLog 5 units 3 times daily, Jardiance 25 mg daily and Ozempic 1 mg weekly. Tolerating medications well with no side effects. She measures her glucose daily, fasting glucose averages 90-120.  Denies any symptoms of hypoglycemia. She is doing better and has been eating healthier since her hospitalization. At next visit, will discuss more regarding CGM options.   Plan -Increase Ozempic to 2 mg weekly if tolerated -Continue Basaglar 30 units at night and NovoLog 5 units 3 times daily -Continue Jardiance 25 mg daily -Refilled meter supplies -Urine microalbumin today

## 2021-11-17 NOTE — Assessment & Plan Note (Signed)
Patient reports hx of IDA which she was taking ferrous sulfate previously. She stopped it when she was hospitalized and did not restart it. Denies hematuria, hematochezia, melena or other bleeding. She still has menstrual cycles. Will consider restarting her iron supplementation.   Plan -CBC today

## 2021-11-18 LAB — CBC
Hematocrit: 33.7 % — ABNORMAL LOW (ref 34.0–46.6)
Hemoglobin: 10.2 g/dL — ABNORMAL LOW (ref 11.1–15.9)
MCH: 20.9 pg — ABNORMAL LOW (ref 26.6–33.0)
MCHC: 30.3 g/dL — ABNORMAL LOW (ref 31.5–35.7)
MCV: 69 fL — ABNORMAL LOW (ref 79–97)
Platelets: 411 10*3/uL (ref 150–450)
RBC: 4.89 x10E6/uL (ref 3.77–5.28)
RDW: 17.6 % — ABNORMAL HIGH (ref 11.7–15.4)
WBC: 7.8 10*3/uL (ref 3.4–10.8)

## 2021-11-18 LAB — MICROALBUMIN / CREATININE URINE RATIO
Creatinine, Urine: 60.9 mg/dL
Microalb/Creat Ratio: 24 mg/g creat (ref 0–29)
Microalbumin, Urine: 14.9 ug/mL

## 2021-11-19 ENCOUNTER — Telehealth: Payer: Self-pay

## 2021-11-19 NOTE — Telephone Encounter (Signed)
Call to pt reference PREP class.  Interested in participating in program.  Needs evening class. Kim Werner will schedule of T/TH 6p-715p. Will call her closer to start of class for 1:1 coaching and measurements

## 2021-11-29 NOTE — Progress Notes (Signed)
Internal Medicine Clinic Attending  Case discussed with Dr. Zheng  At the time of the visit.  We reviewed the resident's history and exam and pertinent patient test results.  I agree with the assessment, diagnosis, and plan of care documented in the resident's note.  

## 2021-12-02 ENCOUNTER — Other Ambulatory Visit (HOSPITAL_COMMUNITY)
Admission: RE | Admit: 2021-12-02 | Discharge: 2021-12-02 | Disposition: A | Discharge status: Home / Self Care | Payer: BC Managed Care – PPO | Source: Ambulatory Visit | Attending: Internal Medicine | Admitting: Internal Medicine

## 2021-12-02 ENCOUNTER — Ambulatory Visit (INDEPENDENT_AMBULATORY_CARE_PROVIDER_SITE_OTHER): Payer: BC Managed Care – PPO | Admitting: Student

## 2021-12-02 ENCOUNTER — Other Ambulatory Visit: Payer: Self-pay

## 2021-12-02 ENCOUNTER — Encounter: Payer: Self-pay | Admitting: Student

## 2021-12-02 VITALS — BP 132/82 | HR 89 | Temp 98.5°F | Resp 28 | Ht 67.0 in | Wt 266.1 lb

## 2021-12-02 DIAGNOSIS — E785 Hyperlipidemia, unspecified: Secondary | ICD-10-CM | POA: Diagnosis not present

## 2021-12-02 DIAGNOSIS — I10 Essential (primary) hypertension: Secondary | ICD-10-CM

## 2021-12-02 DIAGNOSIS — D509 Iron deficiency anemia, unspecified: Secondary | ICD-10-CM

## 2021-12-02 DIAGNOSIS — Z124 Encounter for screening for malignant neoplasm of cervix: Secondary | ICD-10-CM | POA: Diagnosis not present

## 2021-12-02 DIAGNOSIS — Z0000 Encounter for general adult medical examination without abnormal findings: Secondary | ICD-10-CM

## 2021-12-02 DIAGNOSIS — E119 Type 2 diabetes mellitus without complications: Secondary | ICD-10-CM

## 2021-12-02 DIAGNOSIS — B3731 Acute candidiasis of vulva and vagina: Secondary | ICD-10-CM

## 2021-12-02 DIAGNOSIS — Z23 Encounter for immunization: Secondary | ICD-10-CM

## 2021-12-02 DIAGNOSIS — Z794 Long term (current) use of insulin: Secondary | ICD-10-CM

## 2021-12-02 DIAGNOSIS — Z7985 Long-term (current) use of injectable non-insulin antidiabetic drugs: Secondary | ICD-10-CM

## 2021-12-02 MED ORDER — FERROUS SULFATE 325 (65 FE) MG PO TBEC: 325.0000 mg | DELAYED_RELEASE_TABLET | ORAL | 1 refills | Status: DC

## 2021-12-02 MED ORDER — DEXCOM G7 SENSOR MISC: 11 refills | Status: DC

## 2021-12-02 NOTE — Assessment & Plan Note (Signed)
-  Pap smear completed today along with cervicovaginal swab. Patient had pap smear done years ago and stated it was normal. She does endorse some vaginal itching but unsure if she has noticed any obvious discharge but no malodorous smell.  -Received flu shot today.

## 2021-12-02 NOTE — Assessment & Plan Note (Signed)
Patient with hx of IDA. She was on ferrous sulfate but did not restart the supplement. CBC shows microcytic anemia and last iron panel showed low iron saturation and low normal ferritin. Denies any blood in stool or urine. Endorses heavy menstrual cycles at times requiring the larger more absorbent pads. Discussed restarting her iron supplement and repeating labs at future visit.   Plan -refilled and restart ferrous sulfate 325 mg every other day -can repeat CBC and iron panel at next visit

## 2021-12-02 NOTE — Assessment & Plan Note (Addendum)
Diabetes improving with last A1c on 11/1 8.5 from 12.8. She is adherent to Basaglar 30 units nightly, NovoLog 5 units 3 times daily, Jardiance 25 mg daily and Ozempic 1 mg weekly. She has not started the 2 mg Ozempic yet because she had 1-2 doses left. She reports her fasting glucose is ~90. No readings below that or symptoms of hypoglycemia. Encouraged her to continue regimen and when finished with 1 mg Ozempic to start 2 mg dose weekly. Advised patient if she develop GI upset to inform the clinic. She is interested in obtaining a CGM. Will send over Dexcom to pharmacy, patient does have smartphone.   Plan -continue basaglar 30 units daily, novolog 5 units TID, jardiance 25 mg daily -finish 1 mg Ozempic dose then start 2 mg dose weekly  -Dexcom G7 sensor sent  -repeat A1c in 3 months

## 2021-12-02 NOTE — Patient Instructions (Addendum)
Thank you, [name] for allowing [place] to provide your care today.  Diabetes -Please continue with your insulin basaglar and novolog, jardiance and ozempic. You can complete your remaining 1 mg ozempic dose then start 2 mg once a week dose.  -Orders sent for continuous glucose monitoring.   Blood Pressure -Blood pressure today well controlled. Please continue your amlodipine, losartan-HCTZ and carvedilol.   Iron Deficiency Anemia -Please restart your ferrous sulfate 325 mg every other day. Look out for possible constipation.  -Pap smear and vaginal swab done today. -Flu shot today.   I have ordered the following labs for you:  Lab Orders  No laboratory test(s) ordered today     I have ordered the following medication/changed the following medications:   Stop the following medications: There are no discontinued medications.   Start the following medications: Meds ordered this encounter  Medications   ferrous sulfate 325 (65 FE) MG EC tablet    Sig: Take 1 tablet (325 mg total) by mouth every other day.    Dispense:  90 tablet    Refill:  1   Continuous Blood Gluc Sensor (DEXCOM G7 SENSOR) MISC    Sig: Please use as directed to check your blood sugar.    Dispense:  3 each    Refill:  11     Follow up: 2-3 months    Should you have any questions or concerns please call the internal medicine clinic at [phone].    Rana Snare, D.O. [facility]

## 2021-12-02 NOTE — Progress Notes (Signed)
CC: 2 week follow-up  HPI:  [name] is a 46 y.o. female living with a history stated below and presents today for 2 week follow-up. Please see problem based assessment and plan for additional details.  Past Medical History:  Diagnosis Date   Diabetes mellitus without complication ([place])    Hypertension     Current Outpatient Medications on File Prior to Visit  Medication Sig Dispense Refill   Accu-Chek Softclix Lancets lancets Use as directed up to four times daily 100 each 0   Acetaminophen (TYLENOL PO) Take 2 tablets by mouth as needed (period cramps).     amLODipine (NORVASC) 10 MG tablet Take 1 tablet (10 mg total) by mouth daily. 30 tablet 11   atorvastatin (LIPITOR) 40 MG tablet Take 1 tablet (40 mg total) by mouth daily. 30 tablet 5   blood glucose meter kit and supplies Dispense based on patient and insurance preference. Use up to four times daily as directed. (FOR ICD-10 E10.9, E11.9). 1 each 0   Blood Glucose Monitoring Suppl (ACCU-CHEK GUIDE ME) w/Device KIT Use as directed up to four times daily. 1 kit 0   Calcium Carbonate Antacid (TUMS PO) Take 2 tablets by mouth as needed (heartburn).     carvedilol (COREG) 12.5 MG tablet Take 1 tablet (12.5 mg total) by mouth 2 (two) times daily with a meal. 60 tablet 5   empagliflozin (JARDIANCE) 25 MG TABS tablet Take 1 tablet (25 mg total) by mouth daily before breakfast. 30 tablet 2   glucose blood test strip Use as directed up to four times daily 100 each 0   insulin aspart (NOVOLOG) 100 UNIT/ML FlexPen Inject 5 Units into the skin 3 (three) times daily with meals. 15 mL 11   Insulin Glargine (BASAGLAR KWIKPEN) 100 UNIT/ML Inject 30 Units into the skin at bedtime. 9 mL 5   Insulin Pen Needle 32G X 4 MM MISC Use as directed up to four times daily 100 each 5   losartan-hydrochlorothiazide (HYZAAR) 100-25 MG tablet Take 1 tablet by mouth daily. 30 tablet 11   Semaglutide, 2 MG/DOSE, (OZEMPIC, 2 MG/DOSE,) 8 MG/3ML SOPN  Inject 2 mg into the skin once a week. 6 mL 6   No current facility-administered medications on file prior to visit.   Review of Systems: ROS negative except for what is noted on the assessment and plan.  Vitals:   12/02/21 1551  BP: 132/82  Pulse: 89  Resp: (!) 28  Temp: 98.5 F (36.9 C)  TempSrc: Oral  SpO2: 100%  Weight: 266 lb 1.6 oz (120.7 kg)  Height: _0  (1.702 m)   Physical Exam: Constitutional: well-appearing female, sitting in chair, in no acute distress HENT: normocephalic atraumatic Neck: supple Pulmonary/Chest: normal work of breathing on room air GU: chaperone present during exam, no erythema or lesions of pubic area or labia, vagina appeared normal, cervix has no lesions, erythema, or bleeding, some white thick discharge noted  MSK: normal bulk and tone Neurological: alert & oriented x 3 Skin: warm and dry Psych: normal mood and behavior  Assessment & Plan:   HTN (hypertension) BP: 132/82  BP well controlled today after restarting amlodipine. She is on losartan-HCTZ 100-25 mg, amlodipine 10 mg, and carvedilol 12.5 mg twice daily. Tolerating all medications with no reported side effects.   Plan -continue losartan-HCTZ, amlodipine and carvedilol  -repeat BMP at next visit  Type 2 diabetes mellitus without complications ([place]) Diabetes improving with last A1c on 11/1 8.5 from  12.8. She is adherent to Basaglar 30 units nightly, NovoLog 5 units 3 times daily, Jardiance 25 mg daily and Ozempic 1 mg weekly. She has not started the 2 mg Ozempic yet because she had 1-2 doses left. She reports her fasting glucose is ~90. No readings below that or symptoms of hypoglycemia. Encouraged her to continue regimen and when finished with 1 mg Ozempic to start 2 mg dose weekly. Advised patient if she develop GI upset to inform the clinic. She is interested in obtaining a CGM. Will send over Dexcom to pharmacy, patient does have smartphone.   Plan -continue basaglar 30 units  daily, novolog 5 units TID, jardiance 25 mg daily -finish 1 mg Ozempic dose then start 2 mg dose weekly  -Dexcom G7 sensor sent  -repeat A1c in 3 months  Health care maintenance -Pap smear completed today along with cervicovaginal swab. Patient had pap smear done years ago and stated it was normal. She does endorse some vaginal itching but unsure if she has noticed any obvious discharge but no malodorous smell.  -Received flu shot today.   Iron deficiency anemia Patient with hx of IDA. She was on ferrous sulfate but did not restart the supplement. CBC shows microcytic anemia and last iron panel showed low iron saturation and low normal ferritin. Denies any blood in stool or urine. Endorses heavy menstrual cycles at times requiring the larger more absorbent pads. Discussed restarting her iron supplement and repeating labs at future visit.   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 Patient had endorsed vaginal itching but unsure if she has noticed obvious discharge but no malodorous smell. Called patient with results and sent Rx of Diflucan 150 mg once to pharmacy. Advised patient that if she has recurrent yeast infections, we may stop the Jardiance which increases risk of yeast infections.   Plan -sent Diflucan 150 mg once    Patient discussed with Dr. Andris Baumann, D.O. [facility], PGY-1 Phone: [phone] Date 12/03/2021 Time 6:35 PM

## 2021-12-02 NOTE — Assessment & Plan Note (Signed)
BP: 132/82  BP well controlled today after restarting amlodipine. She is on losartan-HCTZ 100-25 mg, amlodipine 10 mg, and carvedilol 12.5 mg twice daily. Tolerating all medications with no reported side effects.   Plan -continue losartan-HCTZ, amlodipine and carvedilol  -repeat BMP at next visit

## 2021-12-02 NOTE — Assessment & Plan Note (Deleted)
Lipid Panel     Component Value Date/Time   CHOL 215 (H) 03/27/2021 0139   TRIG 85 03/27/2021 0139   HDL 49 03/27/2021 0139   CHOLHDL 4.4 03/27/2021 0139   VLDL 17 03/27/2021 0139   LDLCALC 149 (H) 03/27/2021 0139

## 2021-12-03 ENCOUNTER — Telehealth: Payer: Self-pay

## 2021-12-03 DIAGNOSIS — B3731 Acute candidiasis of vulva and vagina: Secondary | ICD-10-CM | POA: Insufficient documentation

## 2021-12-03 HISTORY — DX: Acute candidiasis of vulva and vagina: B37.31

## 2021-12-03 LAB — CERVICOVAGINAL ANCILLARY ONLY
Bacterial Vaginitis (gardnerella): NEGATIVE
Candida Glabrata: NEGATIVE
Candida Vaginitis: POSITIVE — AB
Chlamydia: NEGATIVE
Comment: NEGATIVE
Comment: NEGATIVE
Comment: NEGATIVE
Comment: NEGATIVE
Comment: NEGATIVE
Comment: NORMAL
Neisseria Gonorrhea: NEGATIVE
Trichomonas: NEGATIVE

## 2021-12-03 MED ORDER — FLUCONAZOLE 150 MG PO TABS: 150.0000 mg | ORAL_TABLET | Freq: Every day | ORAL | 0 refills | Status: DC

## 2021-12-03 NOTE — Telephone Encounter (Signed)
Decision:Approved 12/03/2021-12/04/2022

## 2021-12-03 NOTE — Assessment & Plan Note (Signed)
Vaginal swab positive for Candida vaginitis. Patient had endorsed vaginal itching but unsure if she has noticed obvious discharge but no malodorous smell. Called patient with results and sent Rx of Diflucan 150 mg once to pharmacy. Advised patient that if she has recurrent yeast infections, we may stop the Jardiance which increases risk of yeast infections.   Plan -sent Diflucan 150 mg once

## 2021-12-03 NOTE — Telephone Encounter (Signed)
Prior Authorization for patient (Dexcom G7 Sensor) came through on cover my meds was submitted with last office notes awaiting approval or denial

## 2021-12-06 LAB — CYTOLOGY - PAP
Adequacy: ABSENT
Comment: NEGATIVE
Diagnosis: UNDETERMINED — AB
High risk HPV: NEGATIVE

## 2021-12-13 ENCOUNTER — Ambulatory Visit: Payer: BC Managed Care – PPO | Attending: Cardiology | Admitting: Cardiology

## 2021-12-13 ENCOUNTER — Encounter: Payer: Self-pay | Admitting: Cardiology

## 2021-12-13 VITALS — BP 132/78 | HR 91 | Ht 67.0 in | Wt 262.0 lb

## 2021-12-13 DIAGNOSIS — E785 Hyperlipidemia, unspecified: Secondary | ICD-10-CM

## 2021-12-13 DIAGNOSIS — I10 Essential (primary) hypertension: Secondary | ICD-10-CM | POA: Diagnosis not present

## 2021-12-13 DIAGNOSIS — I422 Other hypertrophic cardiomyopathy: Secondary | ICD-10-CM | POA: Diagnosis not present

## 2021-12-13 DIAGNOSIS — E1169 Type 2 diabetes mellitus with other specified complication: Secondary | ICD-10-CM

## 2021-12-13 DIAGNOSIS — I5032 Chronic diastolic (congestive) heart failure: Secondary | ICD-10-CM

## 2021-12-13 DIAGNOSIS — I119 Hypertensive heart disease without heart failure: Secondary | ICD-10-CM | POA: Diagnosis not present

## 2021-12-13 DIAGNOSIS — I110 Hypertensive heart disease with heart failure: Secondary | ICD-10-CM | POA: Diagnosis not present

## 2021-12-13 DIAGNOSIS — I1A0 Resistant hypertension: Secondary | ICD-10-CM

## 2021-12-13 NOTE — Patient Instructions (Addendum)
Medication Instructions:  No changes   *If you need a refill on your cardiac medications before your next appointment, please call your pharmacy*   Lab Work: Not needed    Testing/Procedures:  Not needed  Follow-Up: At [facility], you and your health needs are our priority.  As part of our continuing mission to provide you with exceptional heart care, we have created designated Provider Care Teams.  These Care Teams include your primary Cardiologist (physician) and Advanced Practice Providers (APPs -  Physician Assistants and Nurse Practitioners) who all work together to provide you with the care you need, when you need it.     Your next appointment:   6 month(s)  The format for your next appointment:   In Person  Provider:   Edd Fabian, FNP    Then, Bryan Lemma, MD will plan to see you again in 12 month(s).

## 2021-12-13 NOTE — Progress Notes (Signed)
Primary Care Provider: Angelique Blonder, [place] Cardiologist: Glenetta Hew, MD Electrophysiologist: None  Clinic Note: Chief Complaint  Patient presents with   Follow-up    4 months-doing well.   Congestive Heart Failure    Severe LVH with HFpEF-no PND, orthopnea with trivial edema.   Hypertension    Resistant hypertension on multiple meds.    ===================================  ASSESSMENT/PLAN   Problem List Items Addressed This Visit       Cardiology Problems   Hyperlipidemia associated with type 2 diabetes mellitus (HCC) (Chronic)    LDL 149 in March.  She should have a recheck of labs soon with PCP. If not checked by the time of the follow-up visit 6 months, we should recheck, if not at goal at that time we will initiate rosuvastatin at 20 mg with anticipation of titrating further.      Hypertensive heart disease with chronic diastolic congestive heart failure (HCC) (Chronic)    Very Significant LVH Noted on Echo. => Recommendation for cardiac MRI.  I think we can probably still go through with that but more than likely she just has Hypertensive Heart Disease.  She is on stable BP medication regimen with well-controlled blood pressure now on no recurrent episodes of flash pulm edema/CHF. Not currently on diuretic, but she is on Jardiance at DM dose.  She was feeling so well today that she was hoping to not have to do any further testing or medications.  As such we will hold off on recommendations for further studies until next visit.  Plan: Continue current meds. We will ask her to follow-up in 6 months with Mr. Marilynn Rail, NP.  At that time with discussed checking Cardiac MRI  Also, if not proceed by PCP, with discussed sleep study.      Resistant hypertension (Chronic)    Blood pressure today looks great.  She says this is what it usually is at home.  Feeling much better.  She is probably on a stable regimen.  Plan,  Continue current  meds Amlodipine 10 mg daily Carvedilol 12.5 mg twice daily losartan/HCTZ 100-25 mg daily         Other   Obesity, Class III, BMI 40-49.9 (morbid obesity) (HCC) (Chronic)    Discussed dietary modification.  Hopefully with the higher dose of Ozempic, she will continue to reduce her p.o. intake and help with weight loss.  Needs also exercise which will also help [place] assess her exertional dyspnea.      Other Visit Diagnoses     Hypertensive hypertrophic cardiomyopathy, without heart failure ([place])    -  Primary   Relevant Orders   EKG 12-Lead (Completed)   Essential hypertension       Relevant Orders   EKG 12-Lead (Completed)        Much better.  Stable overall with normal blood pressures.  Working on weight loss.  He is on a stable regimen of meds.  She was hoping to avoid further testing for now, but needs further evaluation in the upcoming future.  She needs labs rechecked-chemistry and lipids.  She also likely needs to go through with cardiac MRI and OSA evaluation. Most poorly, the lifestyle modification for weight loss with diet and exercise is about the importance.  With her being in a good mood today feeling well on good meds.  I chose to give her a holiday present and not order test for now but would recommend follow-up testing either by PCP or Cardiology APP  at follow-up visit in 6 months..  ===================================  HPI:    Kim Werner is a morbidly 46 y.o. female with a PMH notable for HTN (resistant) w/ HTN Heart Disease, HLD, DM-2, OSA (likely OHS) and mild chronic anemia who presents today for 70-monthfollow-up Evaluation of Hypertensive Urgency And (HYPERTENSIVE HEART DISEASE) Acute on Chronic Diastolic CHF. She presents today at the request of ZAngelique Blonder DO.  Initial cardiology consult was on 03/26/2021 That presented with chest pain and dyspnea had missed labetalol and amlodipine dose the morning.  BP was 219/138.  She was taken off lisinopril  which during her pregnancy, for her daughter who is 312years old, and never restarted.=> I saw her this TAVR to consult overnight by fellow, and recommended converting labetalol to carvedilol and restarting ACE-I/ARB and potentially consider spironolactone plus or minus chlorthalidone.  Recommended CMR based on Echo Report  She had been seen on March 22 by JCaron Presume PA (as a hospital follow-up-she was initially seen in consultation during admission on March 10-3/12/2021).   => She was doing much better with her HFpEF.  Noted to have septal hypertrophy at 26 mm.  Recommended CMR.  Home BP readings were usually less than 130/80.  Discussed DASH diet and exercise.  Sleep study ordered.  Recent Hospitalizations:  Overnight admission 7/20-21/2023: Presented with increased dyspnea and accelerated hypertension w/ Flash Pulmonary Edema => also noted to be hyperglycemic.  A1c was 12.8. Treated with IV Lasix and started on BiPAP.  Improved overnight, nitroglycerin discontinued.  Transition to p.o. amlodipine 10 mg daily, carvedilol 12.5 mg twice daily, and losartan-HCTZ 50-12.5 mg daily. => Home BP log Consider PRN clonidine 0.2 mg for uncontrolled hypertension.   Also consider PRN Lasix Very resistant hyperglycemia.  Jaynee MLOANA SALVAGGIOwas last seen on 08/05/2021 by JColetta Memos NP for hospital follow-up.  She is doing relatively well.  Had some minor lightheadedness while doing laundry [place] over.  However blood pressure is well controlled, and if anything it may be a little low for her.  She was working on increasing her exercise level and reducing carbohydrates.  Really trying to work on losing weight.  Denies any chest pain pressure or dyspnea.  No PND orthopnea with trivial edema.  Essentially negative cardiac ROS.  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 => Discussed salty 6 diet On amlodipine, carvedilol, HCTZ and losartan. As needed Lasix for edema and weight gain > 3  pounds in 1 day or 5 pounds in 1 week. Discussed dietary modification and increased exercise.   Reviewed  CV studies:    The following studies were reviewed today: (if available, images/films reviewed: From Epic Chart or Care Everywhere) Echo 03/26/2021: EF 50%.  Low normal to mildly reduced function.  Anterolateral wall and apical lateral segment are hypokinetic.  Severe asymmetric LVH with no LVOT gradient..  Indeterminate TAVR elevated LAP.  Unable to assess PAP but RV is normal.  Normal aortic and mitral valves. => Consider CMR to assess and evaluate hypertrophic cardiomyopathy.  => She did not have Cardiac MRI Performed  Echo 08/05/2021 : EF 50 to 55%.  No RWMA.  Severe concentric LVH.  EDA fusion.-Unable to assess diastolic function -> left atrial size was normal.  Normal RV.  Normal valves.  Normal RAP.  Interval History:   Kim JAMROZreturns today along with her husband stating that she feels great.  She is feeling healthy.  Is working on losing weight-having adjusted her  diet.  She says that her blood pressures are much better controlled at home and she is no longer having issues with chest pain, pressure or dyspnea with rest exertion.  No PND, orthopnea with trivial edema.  Has not noted any significant weight gain.  She is happily back at work and doing well.  Mild edema but nothing significant.  She never filled the Lasix. She just went up on her Ozempic dose is hoping that we will start the process of weight loss.  Trying to be more active with walking.  CV Review of Symptoms (Summary): positive for - she is little bit deconditioned and has open restless intolerance and exertional dyspnea, but that is improving.  Working on weight loss. negative for - chest pain, orthopnea, palpitations, paroxysmal nocturnal dyspnea, rapid heart rate, shortness of breath, or syncope or near syncope, TIA/'amaurosis fugax, claudication  REVIEWED OF SYSTEMS   Review of Systems   Constitutional:  Negative for malaise/fatigue (Just has exercise fatigue from deconditioning and obesity.) and weight loss (Working on it).  HENT:  Negative for congestion.   Respiratory:  Positive for shortness of breath (When she overdoes it).   Cardiovascular:        Per HPI  Gastrointestinal:  Negative for blood in stool and melena.  Genitourinary:  Negative for dysuria.  Musculoskeletal:  Positive for joint pain (Mild aches and pains).  Neurological:  Negative for dizziness.  Psychiatric/Behavioral: Negative.      I have reviewed and (if needed) personally updated the patient's problem list, medications, allergies, past medical and surgical history, social and family history.   PAST MEDICAL HISTORY   Past Medical History:  Diagnosis Date   Diabetes mellitus without complication ([place])    Hyperlipidemia associated with type 2 diabetes mellitus ([place])    Hypertensive heart disease with chronic diastolic congestive heart failure ([place]) 03/2021   Severe LVH with EF 50-55%: Has been admitted twice with profound hypotension, most recentlyPulm edema in July 2023.   Morbid obesity due to excess calories (HCC)    Resistant hypertension    On high-dose carvedilol, losartan, HCTZ and amlodipine    PAST SURGICAL HISTORY   Past Surgical History:  Procedure Laterality Date   TRANSTHORACIC ECHOCARDIOGRAM  03/2021   EF 50%.  Low normal to mildly reduced function.  Anterolateral wall and apical lateral segment are hypokinetic.  Severe asymmetric LVH with no LVOT gradient..  Indeterminate TAVR elevated LAP.  Unable to assess PAP but RV is normal.  Normal aortic and mitral valves. => Consider CMR to assess and evaluate hypertrophic cardiomyopathy.   TRANSTHORACIC ECHOCARDIOGRAM  08/05/2021   EF 50 to 55%.  No RWMA.  Severe concentric LVH.  EDA fusion.-Unable to assess diastolic function -> left atrial size was normal.  Normal RV.  Normal valves.  Normal RAP.    Immunization History   Administered Date(s) Administered   Influenza,inj,Quad PF,6+ Mos 12/02/2021   Td 03/18/2019    MEDICATIONS/ALLERGIES   Current Meds  Medication Sig   Accu-Chek Softclix Lancets lancets Use as directed up to four times daily   Acetaminophen (TYLENOL PO) Take 2 tablets by mouth as needed (period cramps).   amLODipine (NORVASC) 10 MG tablet Take 1 tablet (10 mg total) by mouth daily.   atorvastatin (LIPITOR) 40 MG tablet Take 1 tablet (40 mg total) by mouth daily.   blood glucose meter kit and supplies Dispense based on patient and insurance preference. Use up to four times daily as directed. (FOR ICD-10 E10.9, E11.9).  Blood Glucose Monitoring Suppl (ACCU-CHEK GUIDE ME) w/Device KIT Use as directed up to four times daily.   Calcium Carbonate Antacid (TUMS PO) Take 2 tablets by mouth as needed (heartburn).   carvedilol (COREG) 12.5 MG tablet Take 1 tablet (12.5 mg total) by mouth 2 (two) times daily with a meal.   Continuous Blood Gluc Sensor (DEXCOM G7 SENSOR) MISC Please use as directed to check your blood sugar.   empagliflozin (JARDIANCE) 25 MG TABS tablet Take 1 tablet (25 mg total) by mouth daily before breakfast.   ferrous sulfate 325 (65 FE) MG EC tablet Take 1 tablet (325 mg total) by mouth every other day.   fluconazole (DIFLUCAN) 150 MG tablet Take 1 tablet (150 mg total) by mouth daily.   glucose blood test strip Use as directed up to four times daily   insulin aspart (NOVOLOG) 100 UNIT/ML FlexPen Inject 5 Units into the skin 3 (three) times daily with meals.   Insulin Glargine (BASAGLAR KWIKPEN) 100 UNIT/ML Inject 30 Units into the skin at bedtime.   Insulin Pen Needle 32G X 4 MM MISC Use as directed up to four times daily   losartan-hydrochlorothiazide (HYZAAR) 100-25 MG tablet Take 1 tablet by mouth daily.   Semaglutide, 2 MG/DOSE, (OZEMPIC, 2 MG/DOSE,) 8 MG/3ML SOPN Inject 2 mg into the skin once a week.    No Known Allergies  SOCIAL HISTORY/FAMILY HISTORY   Reviewed  in Epic:  Pertinent findings:  Social History   Tobacco Use   Smoking status: Never   Smokeless tobacco: Never  Vaping Use   Vaping Use: Never used  Substance Use Topics   Alcohol use: Never   Drug use: Never   Social History   Social History Narrative   Recently moved from [place] to [place].   Lives with her husband   She teaches eighth grade    OBJCTIVE -PE, EKG, labs   Wt Readings from Last 3 Encounters:  12/13/21 262 lb (118.8 kg)  12/02/21 266 lb 1.6 oz (120.7 kg)  11/17/21 264 lb 11.2 oz (120.1 kg)    Physical Exam: BP 132/78   Pulse 91   Ht _0  (1.702 m)   Wt 262 lb (118.8 kg)   LMP 11/05/2021   SpO2 98%   BMI 41.04 kg/m  Physical Exam Vitals reviewed.  Constitutional:      General: She is not in acute distress.    Appearance: Normal appearance. She is obese. She is not toxic-appearing.     Comments: Morbidly obese.  Well-groomed.  Seems healthy and in good spirits.  HENT:     Head: Normocephalic and atraumatic.  Neck:     Vascular: No carotid bruit or JVD (Difficult to assess).  Cardiovascular:     Rate and Rhythm: Normal rate and regular rhythm. No extrasystoles are present.    Chest Wall: PMI is not displaced (Unable to assess).     Pulses: Decreased pulses (Difficult to palpate due to body habitus.).     Heart sounds: S1 normal and S2 normal. Heart sounds are distant. Murmur (Cannot exclude soft 1/6 SEM but difficult to auscultate.) heard.     No friction rub. Gallop present. S4 sounds present.  Pulmonary:     Effort: Pulmonary effort is normal. No respiratory distress.     Breath sounds: No wheezing, rhonchi or rales.     Comments: Distant breath sounds Abdominal:     General: Abdomen is flat. Bowel sounds are normal. There is no distension.  Tenderness: There is no abdominal tenderness. There is no guarding.     Comments: Obese.  Unable to assess HSM or bruit.  Musculoskeletal:        General: Swelling (Puffy 1/6 swelling in  both ankles.) present.     Cervical back: Normal range of motion and neck supple.  Skin:    General: Skin is warm and dry.  Neurological:     General: No focal deficit present.     Mental Status: She is alert and oriented to person, place, and time.     Gait: Gait (Knee pain) normal.  Psychiatric:        Mood and Affect: Mood normal.        Behavior: Behavior normal.        Thought Content: Thought content normal.        Judgment: Judgment normal.     Adult ECG Report  Rate: 91 ;  Rhythm: normal sinus rhythm and premature ventricular contractions (PVC); R-ward axis, Non-specific ST-T wave abnormality. ~ CRO Septal MI- age indeterminate  Narrative Interpretation: stable   Recent Labs:     Lab Results  Component Value Date   CHOL 215 (H) 03/27/2021   HDL 49 03/27/2021   LDLCALC 149 (H) 03/27/2021   TRIG 85 03/27/2021   CHOLHDL 4.4 03/27/2021   Lab Results  Component Value Date   CREATININE 0.91 08/13/2021   BUN 16 08/13/2021   NA 138 08/13/2021   K 4.1 08/13/2021   CL 97 08/13/2021   CO2 23 08/13/2021      Latest Ref Rng & Units 11/17/2021    4:53 PM 08/13/2021   12:21 PM 08/06/2021    2:29 AM  CBC  WBC 3.4 - 10.8 x10E3/uL 7.8  8.4  16.0   Hemoglobin 11.1 - 15.9 g/dL 10.2  10.7  11.0   Hematocrit 34.0 - 46.6 % 33.7  34.4  37.2   Platelets 150 - 450 x10E3/uL 411  439  335     Lab Results  Component Value Date   HGBA1C 8.5 (A) 11/17/2021   No results found for: "TSH"  ================================================== I spent a total of 26 minutes with the patient spent in direct patient consultation.  Additional time spent with chart review  / charting (studies, outside notes, etc): 23 min Total Time: 49 min  Current medicines are reviewed at length with the patient today.  (+/- concerns) N/A  Notice: This dictation was prepared with Dragon dictation along with smart phrase technology. Any transcriptional errors that result from this process are unintentional  and may not be corrected upon review.  Studies Ordered:   Orders Placed This Encounter  Procedures   EKG 12-Lead   No orders of the defined types were placed in this encounter.   Patient Instructions / Medication Changes & Studies & Tests Ordered   Patient Instructions  Medication Instructions:  No changes   *If you need a refill on your cardiac medications before your next appointment, please call your pharmacy*   Lab Work: Not needed    Testing/Procedures:  Not needed  Follow-Up: At [facility], you and your health needs are our priority.  As part of our continuing mission to provide you with exceptional heart care, we have created designated Provider Care Teams.  These Care Teams include your primary Cardiologist (physician) and Advanced Practice Providers (APPs -  Physician Assistants and Nurse Practitioners) who all work together to provide you with the care you need, when you need it.  Your next appointment:   6 month(s)  The format for your next appointment:   In Person  Provider:   Coletta Memos, FNP    Then, Glenetta Hew, MD will plan to see you again in 12 month(s).        Leonie Man, MD, MS Glenetta Hew, M.D., M.S. Interventional Cardiologist  [place]  Pager # [phone] Phone # [phone] [address] [place], [place] [address]   Thank you for choosing [facility]!!

## 2021-12-15 ENCOUNTER — Telehealth: Payer: Self-pay

## 2021-12-15 ENCOUNTER — Encounter: Payer: BC Managed Care – PPO | Admitting: Internal Medicine

## 2021-12-15 NOTE — Telephone Encounter (Signed)
Incoming fax from pharmacy: Dexcom G7 sensor Need new rx for reader and transmitter 3 piece system.

## 2021-12-16 NOTE — Progress Notes (Signed)
Internal Medicine Clinic Attending  I saw and evaluated the patient.  I personally confirmed the key portions of the history and exam documented by Dr. Zheng and I reviewed pertinent patient test results.  The assessment, diagnosis, and plan were formulated together and I agree with the documentation in the resident's note.  

## 2021-12-18 NOTE — Progress Notes (Incomplete)
Primary Care Provider: Angelique Blonder, [place] Cardiologist: Glenetta Hew, MD Electrophysiologist: None  Clinic Note: No chief complaint on file.   ===================================  ASSESSMENT/PLAN   Problem List Items Addressed This Visit   None   ===================================  HPI:    Kim Werner is a morbidly 46 y.o. female with a PMH notable for HTN (resistant) w/ HTN Heart Disease, HLD, DM-2, OSA (likely OHS) and mild chronic anemia who presents today for 3-monthfollow-up Evaluation of Hypertensive Urgency And (HYPERTENSIVE HEART DISEASE) Acute on Chronic Diastolic CHF. She presents today at the request of ZAngelique Blonder DO.  Initial cardiology consult was on 03/26/2021 That presented with chest pain and dyspnea had missed labetalol and amlodipine dose the morning.  BP was 219/138.  She was taken off lisinopril which during her pregnancy, for her daughter who is 363years old, and never restarted.=> I saw her this TAVR to consult overnight by fellow, and recommended converting labetalol to carvedilol and restarting ACE-I/ARB and potentially consider spironolactone plus or minus chlorthalidone.  Recommended CMR based on Echo Report  She had been seen on March 22 by JCaron Presume PA (as a hospital follow-up-she was initially seen in consultation during admission on March 10-3/12/2021).   => She was doing much better with her HFpEF.  Noted to have septal hypertrophy at 26 mm.  Recommended CMR.  Home BP readings were usually less than 130/80.  Discussed DASH diet and exercise.  Sleep study ordered.  Recent Hospitalizations:  Overnight admission 7/20-21/2023: Presented with increased dyspnea and accelerated hypertension w/ Flash Pulmonary Edema => also noted to be hyperglycemic.  A1c was 12.8. Treated with IV Lasix and started on BiPAP.  Improved overnight, nitroglycerin discontinued.  Transition to p.o. amlodipine 10 mg daily, carvedilol 12.5 mg  twice daily, and losartan-HCTZ 50-12.5 mg daily. => Home BP log Consider PRN clonidine 0.2 mg for uncontrolled hypertension.   Also consider PRN Lasix Very resistant hyperglycemia.  Kim MJERUSHA REISINGwas last seen on 08/05/2021 by JColetta Memos NP for hospital follow-up.  She is doing relatively well.  Had some minor lightheadedness while doing laundry [place] over.  However blood pressure is well controlled, and if anything it may be a little low for her.  She was working on increasing her exercise level and reducing carbohydrates.  Really trying to work on losing weight.  Denies any chest pain pressure or dyspnea.  No PND orthopnea with trivial edema.  Essentially negative cardiac ROS.  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 => Discussed salty 6 diet On amlodipine, carvedilol, HCTZ and losartan. As needed Lasix for edema and weight gain > 3 pounds in 1 day or 5 pounds in 1 week. Discussed dietary modification and increased exercise.   Reviewed  CV studies:    The following studies were reviewed today: (if available, images/films reviewed: From Epic Chart or Care Everywhere) Echo 03/26/2021: EF 50%.  Low normal to mildly reduced function.  Anterolateral wall and apical lateral segment are hypokinetic.  Severe asymmetric LVH with no LVOT gradient..  Indeterminate TAVR elevated LAP.  Unable to assess PAP but RV is normal.  Normal aortic and mitral valves. => Consider CMR to assess and evaluate hypertrophic cardiomyopathy.  => She did not have Cardiac MRI Performed  Echo 08/05/2021 : EF 50 to 55%.  No RWMA.  Severe concentric LVH.  EDA fusion.-Unable to assess diastolic function -> left atrial size was normal.  Normal RV.  Normal valves.  Normal RAP.  Interval History:   Kim Werner returns today along with her husband stating that she feels great.  She is feeling healthy.  Is working on losing weight-having adjusted her diet.  She says that her blood pressures are  much better controlled at home and she is no longer having issues with chest pain, pressure or dyspnea with rest exertion.  No PND, orthopnea with trivial edema.  Has not noted any significant weight gain.  CV Review of Symptoms (Summary): {[id number]}  REVIEWED OF SYSTEMS   ROS  I have reviewed and (if needed) personally updated the patient's problem list, medications, allergies, past medical and surgical history, social and family history.   PAST MEDICAL HISTORY   Past Medical History:  Diagnosis Date  . Diabetes mellitus without complication ([place])   . Hypertension     PAST SURGICAL HISTORY   No past surgical history on file.  Immunization History  Administered Date(s) Administered  . Influenza,inj,Quad PF,6+ Mos 12/02/2021  . Td 03/18/2019    MEDICATIONS/ALLERGIES   Current Meds  Medication Sig  . Accu-Chek Softclix Lancets lancets Use as directed up to four times daily  . Acetaminophen (TYLENOL PO) Take 2 tablets by mouth as needed (period cramps).  Marland Kitchen amLODipine (NORVASC) 10 MG tablet Take 1 tablet (10 mg total) by mouth daily.  Marland Kitchen atorvastatin (LIPITOR) 40 MG tablet Take 1 tablet (40 mg total) by mouth daily.  . blood glucose meter kit and supplies Dispense based on patient and insurance preference. Use up to four times daily as directed. (FOR ICD-10 E10.9, E11.9).  Marland Kitchen Blood Glucose Monitoring Suppl (ACCU-CHEK GUIDE ME) w/Device KIT Use as directed up to four times daily.  . Calcium Carbonate Antacid (TUMS PO) Take 2 tablets by mouth as needed (heartburn).  . carvedilol (COREG) 12.5 MG tablet Take 1 tablet (12.5 mg total) by mouth 2 (two) times daily with a meal.  . Continuous Blood Gluc Sensor (DEXCOM G7 SENSOR) MISC Please use as directed to check your blood sugar.  . empagliflozin (JARDIANCE) 25 MG TABS tablet Take 1 tablet (25 mg total) by mouth daily before breakfast.  . ferrous sulfate 325 (65 FE) MG EC tablet Take 1 tablet (325 mg total) by mouth every other day.   . fluconazole (DIFLUCAN) 150 MG tablet Take 1 tablet (150 mg total) by mouth daily.  Marland Kitchen glucose blood test strip Use as directed up to four times daily  . insulin aspart (NOVOLOG) 100 UNIT/ML FlexPen Inject 5 Units into the skin 3 (three) times daily with meals.  . Insulin Glargine (BASAGLAR KWIKPEN) 100 UNIT/ML Inject 30 Units into the skin at bedtime.  . Insulin Pen Needle 32G X 4 MM MISC Use as directed up to four times daily  . losartan-hydrochlorothiazide (HYZAAR) 100-25 MG tablet Take 1 tablet by mouth daily.  . Semaglutide, 2 MG/DOSE, (OZEMPIC, 2 MG/DOSE,) 8 MG/3ML SOPN Inject 2 mg into the skin once a week.    No Known Allergies  SOCIAL HISTORY/FAMILY HISTORY   Reviewed in Epic:  Pertinent findings:  Social History   Tobacco Use  . Smoking status: Never  . Smokeless tobacco: Never  Vaping Use  . Vaping Use: Never used  Substance Use Topics  . Alcohol use: Never  . Drug use: Never   Social History   Social History Narrative  . Not on file    OBJCTIVE -PE, EKG, labs   Wt Readings from Last 3 Encounters:  12/13/21 262 lb (118.8 kg)  12/02/21  266 lb 1.6 oz (120.7 kg)  11/17/21 264 lb 11.2 oz (120.1 kg)    Physical Exam: BP 132/78   Pulse 91   Ht _0  (1.702 m)   Wt 262 lb (118.8 kg)   LMP 11/05/2021   SpO2 98%   BMI 41.04 kg/m  Physical Exam   Adult ECG Report  Rate: 91 ;  Rhythm: normal sinus rhythm and premature ventricular contractions (PVC); R-ward axis, Non-specific ST-T wave abnormality. ~ CRO Septal MI- age indeterminate  Narrative Interpretation: stable   Recent Labs:     Lab Results  Component Value Date   CHOL 215 (H) 03/27/2021   HDL 49 03/27/2021   LDLCALC 149 (H) 03/27/2021   TRIG 85 03/27/2021   CHOLHDL 4.4 03/27/2021   Lab Results  Component Value Date   CREATININE 0.91 08/13/2021   BUN 16 08/13/2021   NA 138 08/13/2021   K 4.1 08/13/2021   CL 97 08/13/2021   CO2 23 08/13/2021      Latest Ref Rng & Units 11/17/2021     4:53 PM 08/13/2021   12:21 PM 08/06/2021    2:29 AM  CBC  WBC 3.4 - 10.8 x10E3/uL 7.8  8.4  16.0   Hemoglobin 11.1 - 15.9 g/dL 10.2  10.7  11.0   Hematocrit 34.0 - 46.6 % 33.7  34.4  37.2   Platelets 150 - 450 x10E3/uL 411  439  335     Lab Results  Component Value Date   HGBA1C 8.5 (A) 11/17/2021   No results found for: "TSH"  ================================================== I spent a total of ***minutes with the patient spent in direct patient consultation.  Additional time spent with chart review  / charting (studies, outside notes, etc): *** min Total Time: *** min  Current medicines are reviewed at length with the patient today.  (+/- concerns) ***  Notice: This dictation was prepared with Dragon dictation along with smart phrase technology. Any transcriptional errors that result from this process are unintentional and may not be corrected upon review.  Studies Ordered:   No orders of the defined types were placed in this encounter.  No orders of the defined types were placed in this encounter.   Patient Instructions / Medication Changes & Studies & Tests Ordered   There are no Patient Instructions on file for this visit.     Leonie Man, MD, MS Glenetta Hew, M.D., M.S. Interventional Cardiologist  [place]  Pager # [phone] Phone # [phone] [address] [place], [place] [address]   Thank you for choosing [facility]!!

## 2021-12-19 ENCOUNTER — Encounter: Payer: Self-pay | Admitting: Cardiology

## 2021-12-19 NOTE — Assessment & Plan Note (Signed)
Very Significant LVH Noted on Echo. => Recommendation for cardiac MRI.  I think we can probably still go through with that but more than likely she just has Hypertensive Heart Disease.  She is on stable BP medication regimen with well-controlled blood pressure now on no recurrent episodes of flash pulm edema/CHF. Not currently on diuretic, but she is on Jardiance at DM dose.  She was feeling so well today that she was hoping to not have to do any further testing or medications.  As such we will hold off on recommendations for further studies until next visit.  Plan: Continue current meds. We will ask her to follow-up in 6 months with Mr. Molli Hazard, NP.  At that time with discussed checking Cardiac MRI  Also, if not proceed by PCP, with discussed sleep study.

## 2021-12-19 NOTE — Assessment & Plan Note (Signed)
Discussed dietary modification.  Hopefully with the higher dose of Ozempic, she will continue to reduce her p.o. intake and help with weight loss.  Needs also exercise which will also help [place] assess her exertional dyspnea.

## 2021-12-19 NOTE — Assessment & Plan Note (Signed)
LDL 149 in March.  She should have a recheck of labs soon with PCP. If not checked by the time of the follow-up visit 6 months, we should recheck, if not at goal at that time we will initiate rosuvastatin at 20 mg with anticipation of titrating further.

## 2021-12-19 NOTE — Assessment & Plan Note (Signed)
Blood pressure today looks great.  She says this is what it usually is at home.  Feeling much better.  She is probably on a stable regimen.  Plan,  Continue current meds Amlodipine 10 mg daily Carvedilol 12.5 mg twice daily losartan/HCTZ 100-25 mg daily

## 2021-12-22 LAB — HM DIABETES EYE EXAM

## 2021-12-22 MED ORDER — DEXCOM G7 RECEIVER DEVI: 0 refills | Status: AC

## 2022-02-08 ENCOUNTER — Other Ambulatory Visit: Payer: Self-pay

## 2022-02-08 MED ORDER — INSULIN PEN NEEDLE 32G X 4 MM MISC: 1.0000 | Freq: Every day | 8 refills | Status: DC

## 2022-02-10 ENCOUNTER — Other Ambulatory Visit: Payer: Self-pay

## 2022-02-10 ENCOUNTER — Ambulatory Visit (INDEPENDENT_AMBULATORY_CARE_PROVIDER_SITE_OTHER): Payer: BC Managed Care – PPO | Admitting: Student

## 2022-02-10 ENCOUNTER — Encounter: Payer: Self-pay | Admitting: Student

## 2022-02-10 VITALS — BP 150/91 | HR 114 | Temp 98.5°F | Ht 67.0 in | Wt 265.5 lb

## 2022-02-10 DIAGNOSIS — N6314 Unspecified lump in the right breast, lower inner quadrant: Secondary | ICD-10-CM

## 2022-02-10 DIAGNOSIS — N644 Mastodynia: Secondary | ICD-10-CM | POA: Insufficient documentation

## 2022-02-10 DIAGNOSIS — I1A0 Resistant hypertension: Secondary | ICD-10-CM

## 2022-02-10 NOTE — Patient Instructions (Signed)
Kim Werner,  It was a pleasure seeing you in the clinic today.   I have ordered a mammogram to take a closer look at your breasts to make sure they are fine. They will call you to schedule this appointment. Please follow up in 1 month.   Please call our clinic at [phone] if you have any questions or concerns. The best time to call is Monday-Friday from 9am-4pm, but there is someone available 24/7 at the same number. If you need medication refills, please notify your pharmacy one week in advance and they will send [place] a request.   Thank you for letting us take part in your care. We look forward to seeing you next time!

## 2022-02-10 NOTE — Progress Notes (Signed)
   CC: breast pain x3 months, R breast lump  HPI:  Kim Werner is a 47 y.o. female with history listed below presenting to the [facility] for breast pain x3 months, R breast lump. Please see individualized problem based charting for full HPI.  Past Medical History:  Diagnosis Date   Diabetes mellitus without complication ([place])    Hyperlipidemia associated with type 2 diabetes mellitus ([place])    Hypertensive heart disease with chronic diastolic congestive heart failure ([place]) 03/2021   Severe LVH with EF 50-55%: Has been admitted twice with profound hypotension, most recentlyPulm edema in July 2023.   Morbid obesity due to excess calories ([facility])    Resistant hypertension    On high-dose carvedilol, losartan, HCTZ and amlodipine    Review of Systems:  Negative aside from that listed in individualized problem based charting.  Physical Exam:  Vitals:   02/10/22 1501  BP: (!) 133/90  Pulse: (!) 112  Temp: 98.5 F (36.9 C)  TempSrc: Oral  SpO2: 99%  Weight: 265 lb 8 oz (120.4 kg)  Height: 5\' 7"  (1.702 m)   Physical Exam Constitutional:      Appearance: Normal appearance. She is obese. She is not ill-appearing.  HENT:     Nose: Congestion present.     Mouth/Throat:     Mouth: Mucous membranes are moist.     Pharynx: Oropharynx is clear.  Eyes:     General: No scleral icterus.    Extraocular Movements: Extraocular movements intact.     Conjunctiva/sclera: Conjunctivae normal.     Pupils: Pupils are equal, round, and reactive to light.  Cardiovascular:     Pulses: Normal pulses.     Heart sounds: Normal heart sounds. No murmur heard.    No friction rub. No gallop.  Pulmonary:     Effort: Pulmonary effort is normal. No respiratory distress.     Breath sounds: Normal breath sounds.  Chest:  Breasts:    Right: No swelling, bleeding, inverted nipple, nipple discharge, skin change or tenderness.     Left: No swelling, bleeding, inverted nipple, nipple discharge, skin change  or tenderness.       Comments: No TTP on my exam. No overlying skin changes, nipple discharge, nipple inversion, bleeding. She does have a small, rubbery, mobile mass on inferomedial aspect of R breast.  Abdominal:     General: Bowel sounds are normal. There is no distension.     Palpations: Abdomen is soft.     Tenderness: There is no abdominal tenderness.  Musculoskeletal:        General: No swelling. Normal range of motion.  Skin:    General: Skin is warm and dry.  Neurological:     General: No focal deficit present.     Mental Status: She is alert and oriented to person, place, and time.  Psychiatric:        Mood and Affect: Mood normal.        Behavior: Behavior normal.      Assessment & Plan:   See Encounters Tab for problem based charting.  Patient discussed with Dr. Evette Doffing

## 2022-02-10 NOTE — Assessment & Plan Note (Signed)
Patient living with HTN, currently on norvasc 10mg  daily and hyzaar 100-25mg  daily. BP is elevated in clinic today at 133/90. She has not taken her hyzaar today, which is likely resulting in elevated pressures. We discussed strict adherence with her BP medications. Will have her follow up in 1 month to repeat BP. If remains elevated and adherent with her medications, consider working up for secondary HTN causes and consider addition of spironolactone thereafter as 4th agent.   Plan: -continue norvasc and hyzaar -f/u in 1 month to repeat BP -if remains elevated, consider secondary HTN workup and addition of spiro

## 2022-02-10 NOTE — Assessment & Plan Note (Signed)
Patient complaining of 3 month history of bilateral breast pain. States that her breast suddenly began causing pain and this has been occurring intermittently over the past 3 months. She denies any trauma to the area, nipple discharge, overlying skin changes, nipple inversion, bleeding. She has remote history of breast biopsy about 7 years ago in [place] and reports that they removed unusual appearing cells (does not know exactly what was found). She notes that her breast pain is at times worse with her menstrual cycle, but not always. Her cycles have been regular (28-day cycles) and most recent cycle ended earlier this month.   On exam, she does not have any TTP on exam bilaterally. No swelling, bleeding, nipple inversion, nipple discharge, overlying skin changes noted bilaterally. She does have a small, rubbery, mobile lump on inferomedial aspect of R breast.  She reports that when pain comes on, it is typically located on the inferomedial aspect of R breast (overlying rubbery lump) and on the inferolateral aspect of L breast.   Plan: -diagnostic mammogram of bilateral breasts for further evaluation of intermittent tenderness and of R breast lump

## 2022-02-11 NOTE — Progress Notes (Signed)
Internal Medicine Clinic Attending  Case discussed with Dr. Jinwala  At the time of the visit.  We reviewed the resident's history and exam and pertinent patient test results.  I agree with the assessment, diagnosis, and plan of care documented in the resident's note.  

## 2022-02-17 ENCOUNTER — Encounter: Payer: Self-pay | Admitting: Dietician

## 2022-02-22 LAB — HM DIABETES EYE EXAM

## 2022-03-08 ENCOUNTER — Encounter: Payer: Self-pay | Admitting: Dietician

## 2022-03-14 ENCOUNTER — Encounter: Payer: BC Managed Care – PPO | Admitting: Student

## 2022-04-11 ENCOUNTER — Other Ambulatory Visit: Payer: Self-pay | Admitting: Student

## 2022-07-07 ENCOUNTER — Emergency Department (HOSPITAL_COMMUNITY): Payer: BC Managed Care – PPO

## 2022-07-07 ENCOUNTER — Other Ambulatory Visit: Payer: Self-pay

## 2022-07-07 ENCOUNTER — Emergency Department (HOSPITAL_COMMUNITY)
Admission: EM | Admit: 2022-07-07 | Discharge: 2022-07-08 | Disposition: A | Discharge status: Home / Self Care | Payer: BC Managed Care – PPO | Attending: Emergency Medicine | Admitting: Emergency Medicine

## 2022-07-07 DIAGNOSIS — I509 Heart failure, unspecified: Secondary | ICD-10-CM | POA: Insufficient documentation

## 2022-07-07 DIAGNOSIS — R0602 Shortness of breath: Secondary | ICD-10-CM | POA: Diagnosis present

## 2022-07-07 DIAGNOSIS — E876 Hypokalemia: Secondary | ICD-10-CM | POA: Insufficient documentation

## 2022-07-07 DIAGNOSIS — F410 Panic disorder [episodic paroxysmal anxiety] without agoraphobia: Secondary | ICD-10-CM | POA: Diagnosis not present

## 2022-07-07 DIAGNOSIS — Z1152 Encounter for screening for COVID-19: Secondary | ICD-10-CM | POA: Insufficient documentation

## 2022-07-07 LAB — CBC
HCT: 32.5 % — ABNORMAL LOW (ref 36.0–46.0)
Hemoglobin: 9.6 g/dL — ABNORMAL LOW (ref 12.0–15.0)
MCH: 21.1 pg — ABNORMAL LOW (ref 26.0–34.0)
MCHC: 29.5 g/dL — ABNORMAL LOW (ref 30.0–36.0)
MCV: 71.6 fL — ABNORMAL LOW (ref 80.0–100.0)
Platelets: 446 10*3/uL — ABNORMAL HIGH (ref 150–400)
RBC: 4.54 MIL/uL (ref 3.87–5.11)
RDW: 18.2 % — ABNORMAL HIGH (ref 11.5–15.5)
WBC: 8.3 10*3/uL (ref 4.0–10.5)
nRBC: 0 % (ref 0.0–0.2)

## 2022-07-07 LAB — BASIC METABOLIC PANEL
Anion gap: 12 (ref 5–15)
BUN: 15 mg/dL (ref 6–20)
CO2: 25 mmol/L (ref 22–32)
Calcium: 8.9 mg/dL (ref 8.9–10.3)
Chloride: 99 mmol/L (ref 98–111)
Creatinine, Ser: 0.9 mg/dL (ref 0.44–1.00)
GFR, Estimated: >60 mL/min (ref >60)
Glucose, Bld: 256 mg/dL — ABNORMAL HIGH (ref 70–99)
Potassium: 3.2 mmol/L — ABNORMAL LOW (ref 3.5–5.1)
Sodium: 136 mmol/L (ref 135–145)

## 2022-07-07 LAB — TROPONIN I (HIGH SENSITIVITY): Troponin I (High Sensitivity): 23 ng/L — ABNORMAL HIGH (ref <18)

## 2022-07-07 LAB — SARS CORONAVIRUS 2 BY RT PCR: SARS Coronavirus 2 by RT PCR: NEGATIVE

## 2022-07-07 NOTE — ED Provider Triage Note (Signed)
Emergency Medicine Provider Triage Evaluation Note  Kim Werner , a 47 y.o. female  was evaluated in triage.  Pt complains of chest pain, shortness of breath for the past 5 days.  Patient dates she recently took a road trip to [place] last weekend when she began feeling ill and had 1 episode of emesis on the way up.  Since then patient has continued to feel ill with substernal chest pain and shortness of breath.  Patient states chest pain is not pleuritic in nature but is unsure of what is causing it.  Patient states the chest pain is constant however but does not radiate.  Patient feels that she is unable to catch her breath but also notes that her husband is sick with a viral illness at home as well.  Patient denied history of blood clots, hemoptysis, leg swelling, recent surgeries/hospitalizations, blood thinners, dysuria  Review of Systems  Positive: See HPI Negative: See HPI  Physical Exam  BP (!) 209/124 (BP Location: Right Arm)   Pulse (!) 101   Temp 99.2 F (37.3 C) (Oral)   Resp 16   SpO2 100%  Gen:   Awake, no distress   Resp:  Normal effort  MSK:   Moves extremities without difficulty  Other:    Medical Decision Making  Medically screening exam initiated at 9:48 PM.  Appropriate orders placed.  Kim Werner was informed that the remainder of the evaluation will be completed by another provider, this initial triage assessment does not replace that evaluation, and the importance of remaining in the ED until their evaluation is complete.  Workup initiated, patient blood pressure and triage was 209/124 in the patient stated that she had taken her blood pressure meds today, patient does not endorsing any headache or vision changes at this time.  Patient stable.   Netta Corrigan, PA-C 07/07/22 2151

## 2022-07-07 NOTE — ED Triage Notes (Signed)
Patient reports SOB this evening with pain across her chest with deep inspiration , emesis x1 , long road travel last week .

## 2022-07-08 LAB — BRAIN NATRIURETIC PEPTIDE: B Natriuretic Peptide: 38.2 pg/mL (ref 0.0–100.0)

## 2022-07-08 LAB — TROPONIN I (HIGH SENSITIVITY): Troponin I (High Sensitivity): 20 ng/L — ABNORMAL HIGH (ref <18)

## 2022-07-08 MED ORDER — POTASSIUM CHLORIDE CRYS ER 20 MEQ PO TBCR
40.0000 meq | EXTENDED_RELEASE_TABLET | Freq: Once | ORAL | Status: AC
  Administered 2022-07-08: 40 meq via ORAL
  Filled 2022-07-08: qty 2

## 2022-07-08 NOTE — ED Provider Notes (Signed)
[facility] Emergency Department Provider Note MRN:  [id number]  Arrival date & time: 07/08/22     Chief Complaint   Shortness of Breath   History of Present Illness   Kim Werner is a 47 y.o. year-old female presents to the ED with chief complaint of shortness of.  Patient states that her husband is being evaluated in the emergency department and she became very nervous when she was coming to visit him.  She states that she felt like she was having a panic attack.  She states that she felt immediately better after she saw him and that he was doing okay.  She states that this panic attack like symptoms cause her to feel short of breath and prompted her to check in for evaluation as well.  She states that she feels much better now and is asking to go home.  History provided by patient.   Review of Systems  Pertinent positive and negative review of systems noted in HPI.    Physical Exam   Vitals:   07/07/22 2143 07/08/22 0103  BP: (!) 209/124 (!) 166/100  Pulse: (!) 101 82  Resp: 16 17  Temp: 99.2 F (37.3 C) 99 F (37.2 C)  SpO2: 100% 100%    CONSTITUTIONAL:  well-appearing, NAD NEURO:  Alert and oriented x 3, CN 3-12 grossly intact EYES:  eyes equal and reactive ENT/NECK:  Supple, no stridor  CARDIO:  normal rate, regular rhythm, appears well-perfused  PULM:  No respiratory distress, CTAB GI/GU:  non-distended, non tender MSK/SPINE:  No gross deformities, no edema, moves all extremities  SKIN:  no rash, atraumatic   *Additional and/or pertinent findings included in MDM below  Diagnostic and Interventional Summary    EKG Interpretation  Date/Time:    Ventricular Rate:    PR Interval:    QRS Duration:   QT Interval:    QTC Calculation:   R Axis:     Text Interpretation:         Labs Reviewed  BASIC METABOLIC PANEL - Abnormal; Notable for the following components:      Result Value   Potassium 3.2 (*)    Glucose, Bld 256 (*)     All other components within normal limits  CBC - Abnormal; Notable for the following components:   Hemoglobin 9.6 (*)    HCT 32.5 (*)    MCV 71.6 (*)    MCH 21.1 (*)    MCHC 29.5 (*)    RDW 18.2 (*)    Platelets 446 (*)    All other components within normal limits  TROPONIN I (HIGH SENSITIVITY) - Abnormal; Notable for the following components:   Troponin I (High Sensitivity) 23 (*)    All other components within normal limits  TROPONIN I (HIGH SENSITIVITY) - Abnormal; Notable for the following components:   Troponin I (High Sensitivity) 20 (*)    All other components within normal limits  SARS CORONAVIRUS 2 BY RT PCR  BRAIN NATRIURETIC PEPTIDE    DG Chest Port 1 View  Final Result      Medications  potassium chloride SA (KLOR-CON M) CR tablet 40 mEq (has no administration in time range)     Procedures  /  Critical Care Procedures  ED Course and Medical Decision Making  I have reviewed the triage vital signs, the nursing notes, and pertinent available records from the EMR.  Social Determinants Affecting Complexity of Care: Patient has no clinically significant social determinants affecting  this chief complaint..   ED Course:    Medical Decision Making Patient here with shortness of breath.  She states that she was nervous about coming to visit her husband to was being evaluated for cough and fever.  She states that upon seeing that her husband was okay she felt much better.  She denies having any symptoms now.  She is concerned that she had a panic attack.  She does have history of CHF, but is not in any respiratory distress.  Her BNP is normal.  Other labs are relatively reassuring.  Patient states that she feels well and would like to go home.  I think this is reasonable.  Will have her follow-up with her regular doctors.  Amount and/or Complexity of Data Reviewed Labs: ordered.    Details: Potassium is a little low at 3.2, supplemented in the ED Troponins are  flat (23->20) and near patient's baseline BNP is normal COVID is negative Radiology: independent interpretation performed.    Details: Mild vascular congestion seen on chest x-ray, no effusion  Risk Prescription drug management.         Consultants: No consultations were needed in caring for this patient.   Treatment and Plan: Emergency department workup does not suggest an emergent condition requiring admission or immediate intervention beyond  what has been performed at this time. The patient is safe for discharge and has  been instructed to return immediately for worsening symptoms, change in  symptoms or any other concerns    Final Clinical Impressions(s) / ED Diagnoses     ICD-10-CM   1. Anxiety attack  F41.0       ED Discharge Orders     None         Discharge Instructions Discussed with and Provided to Patient:   Discharge Instructions   None      Roxy Horseman, PA-C 07/08/22 0243    Mesner, Barbara Cower, MD 07/08/22 [phone]

## 2022-07-12 ENCOUNTER — Other Ambulatory Visit: Payer: Self-pay

## 2022-07-12 DIAGNOSIS — I10 Essential (primary) hypertension: Secondary | ICD-10-CM

## 2022-07-12 MED ORDER — AMLODIPINE BESYLATE 10 MG PO TABS
10.0000 mg | ORAL_TABLET | Freq: Every day | ORAL | 11 refills | Status: DC
Start: 2022-07-12 — End: 2022-07-20

## 2022-07-20 ENCOUNTER — Telehealth: Payer: Self-pay

## 2022-07-20 ENCOUNTER — Ambulatory Visit (INDEPENDENT_AMBULATORY_CARE_PROVIDER_SITE_OTHER): Payer: BC Managed Care – PPO | Admitting: Student

## 2022-07-20 ENCOUNTER — Other Ambulatory Visit: Payer: Self-pay

## 2022-07-20 ENCOUNTER — Encounter: Payer: Self-pay | Admitting: Student

## 2022-07-20 VITALS — BP 138/80 | HR 96 | Temp 98.2°F | Ht 67.0 in | Wt 261.1 lb

## 2022-07-20 DIAGNOSIS — E785 Hyperlipidemia, unspecified: Secondary | ICD-10-CM | POA: Diagnosis not present

## 2022-07-20 DIAGNOSIS — Z6841 Body Mass Index (BMI) 40.0 and over, adult: Secondary | ICD-10-CM

## 2022-07-20 DIAGNOSIS — D509 Iron deficiency anemia, unspecified: Secondary | ICD-10-CM

## 2022-07-20 DIAGNOSIS — E119 Type 2 diabetes mellitus without complications: Secondary | ICD-10-CM

## 2022-07-20 DIAGNOSIS — E1169 Type 2 diabetes mellitus with other specified complication: Secondary | ICD-10-CM

## 2022-07-20 DIAGNOSIS — I1A0 Resistant hypertension: Secondary | ICD-10-CM | POA: Diagnosis not present

## 2022-07-20 DIAGNOSIS — Z794 Long term (current) use of insulin: Secondary | ICD-10-CM

## 2022-07-20 LAB — POCT GLYCOSYLATED HEMOGLOBIN (HGB A1C): Hemoglobin A1C: 8.7 % — AB (ref 4.0–5.6)

## 2022-07-20 LAB — GLUCOSE, CAPILLARY: Glucose-Capillary: 197 mg/dL — ABNORMAL HIGH (ref 70–99)

## 2022-07-20 MED ORDER — BASAGLAR KWIKPEN 100 UNIT/ML ~~LOC~~ SOPN: 25.0000 [IU] | PEN_INJECTOR | Freq: Every day | SUBCUTANEOUS | 9 refills | Status: DC

## 2022-07-20 MED ORDER — FERROUS SULFATE 325 (65 FE) MG PO TABS
325.0000 mg | ORAL_TABLET | ORAL | 5 refills | Status: DC
Start: 2022-07-20 — End: 2023-07-25

## 2022-07-20 MED ORDER — CARVEDILOL 12.5 MG PO TABS: 12.5000 mg | ORAL_TABLET | Freq: Two times a day (BID) | ORAL | 3 refills | Status: DC

## 2022-07-20 MED ORDER — OLMESARTAN-AMLODIPINE-HCTZ 40-10-25 MG PO TABS
1.0000 | ORAL_TABLET | Freq: Every day | ORAL | 3 refills | Status: DC
Start: 2022-07-20 — End: 2022-10-05

## 2022-07-20 MED ORDER — OZEMPIC (2 MG/DOSE) 8 MG/3ML ~~LOC~~ SOPN
2.0000 mg | PEN_INJECTOR | SUBCUTANEOUS | 6 refills | Status: DC
Start: 2022-07-20 — End: 2022-10-05

## 2022-07-20 MED ORDER — INSULIN ASPART 100 UNIT/ML FLEXPEN
5.0000 [IU] | PEN_INJECTOR | Freq: Three times a day (TID) | SUBCUTANEOUS | 11 refills | Status: DC
Start: 2022-07-20 — End: 2023-09-28

## 2022-07-20 MED ORDER — ATORVASTATIN CALCIUM 40 MG PO TABS
40.0000 mg | ORAL_TABLET | Freq: Every day | ORAL | 3 refills | Status: DC
Start: 2022-07-20 — End: 2023-01-25

## 2022-07-20 NOTE — Patient Instructions (Addendum)
Thank you, Kim Werner for allowing [place] to provide your care today. Today we discussed your blood pressure, diabetes and cholesterol.   -A1c still high at 8.7%. -Continue taking basaglar (long acting insulin) 25 units daily  -Please make sure to take novolog (short acting insulin 5 units BEFORE meals  -Continue your Ozempic weekly.  -Will stop Jardiance since it was giving you frequent yeast infections.  -Continue checking your blood sugar and let the office know if concerns for low blood sugar.    -Blood pressure still high  -I have simplified your blood pressure medicines today. Take pill called: olmesartan-amlodipine-hydrochlorothiazide 40-10-25 mg one time a day.  -Continue your carvedilol 12.5 mg two times a day -Will recheck blood pressure at next visit in 3-4 weeks -Once we control your blood pressure better, hopefully it will help with symptoms you were having before.   -Will do blood work today to check cholesterol, electrolytes and iron levels  -I refilled your iron pills today. All refills sent to [facility] on [address]   I have ordered the following labs for you:  Lab Orders         Glucose, capillary         Lipid Profile         BMP8+Anion Gap         Iron and IBC ([id number])         Ferritin         POC Hbg A1C      I have ordered the following medication/changed the following medications:   Stop the following medications: Medications Discontinued During This Encounter  Medication Reason   amLODipine (NORVASC) 10 MG tablet Change in therapy   losartan-hydrochlorothiazide (HYZAAR) 100-25 MG tablet Change in therapy   carvedilol (COREG) 12.5 MG tablet Reorder   Semaglutide, 2 MG/DOSE, (OZEMPIC, 2 MG/DOSE,) 8 MG/3ML SOPN Reorder   Insulin Glargine (BASAGLAR KWIKPEN) 100 UNIT/ML Reorder   insulin aspart (NOVOLOG) 100 UNIT/ML FlexPen Reorder   ferrous sulfate 325 (65 FE) MG EC tablet    atorvastatin (LIPITOR) 40 MG tablet Reorder     Start the  following medications: Meds ordered this encounter  Medications   Olmesartan-amLODIPine-HCTZ 40-10-25 MG TABS    Sig: Take 1 tablet by mouth daily.    Dispense:  90 tablet    Refill:  3   carvedilol (COREG) 12.5 MG tablet    Sig: Take 1 tablet (12.5 mg total) by mouth 2 (two) times daily with a meal.    Dispense:  180 tablet    Refill:  3   Semaglutide, 2 MG/DOSE, (OZEMPIC, 2 MG/DOSE,) 8 MG/3ML SOPN    Sig: Inject 2 mg into the skin once a week.    Dispense:  6 mL    Refill:  6   Insulin Glargine (BASAGLAR KWIKPEN) 100 UNIT/ML    Sig: Inject 25 Units into the skin at bedtime.    Dispense:  9 mL    Refill:  9   insulin aspart (NOVOLOG) 100 UNIT/ML FlexPen    Sig: Inject 5 Units into the skin 3 (three) times daily before meals.    Dispense:  15 mL    Refill:  11   ferrous sulfate 325 (65 FE) MG tablet    Sig: Take 1 tablet (325 mg total) by mouth every other day.    Dispense:  30 tablet    Refill:  5   atorvastatin (LIPITOR) 40 MG tablet    Sig: Take 1 tablet (40  mg total) by mouth daily.    Dispense:  90 tablet    Refill:  3     Follow up:  3-4 weeks    Should you have any questions or concerns please call the internal medicine clinic at [phone].    Rana Snare, D.O. [facility]

## 2022-07-20 NOTE — Progress Notes (Signed)
CC: F/u on T2DM, HTN and recent ED visit  HPI:  [name] is a 47 y.o. female living with a history stated below and presents today for f/u on T2DM, HTN and recent ED visit. Please see problem based assessment and plan for additional details.  Past Medical History:  Diagnosis Date   Candida vaginitis 12/03/2021   Diabetes mellitus without complication (HCC)    Hyperlipidemia associated with type 2 diabetes mellitus (HCC)    Hypertensive heart disease with chronic diastolic congestive heart failure (HCC) 03/2021   Severe LVH with EF 50-55%: Has been admitted twice with profound hypotension, most recentlyPulm edema in July 2023.   Morbid obesity due to excess calories (HCC)    Resistant hypertension    On high-dose carvedilol, losartan, HCTZ and amlodipine    Current Outpatient Medications on File Prior to Visit  Medication Sig Dispense Refill   Accu-Chek Softclix Lancets lancets Use as directed up to four times daily 100 each 0   Acetaminophen (TYLENOL PO) Take 2 tablets by mouth as needed (period cramps).     blood glucose meter kit and supplies Dispense based on patient and insurance preference. Use up to four times daily as directed. (FOR ICD-10 E10.9, E11.9). 1 each 0   Blood Glucose Monitoring Suppl (ACCU-CHEK GUIDE ME) w/Device KIT Use as directed up to four times daily. 1 kit 0   Calcium Carbonate Antacid (TUMS PO) Take 2 tablets by mouth as needed (heartburn).     Continuous Blood Gluc Receiver (DEXCOM G7 RECEIVER) DEVI Please use as directed to check your blood sugar. 1 each 0   Continuous Blood Gluc Sensor (DEXCOM G7 SENSOR) MISC Please use as directed to check your blood sugar. 3 each 11   glucose blood test strip Use as directed up to four times daily 100 each 0   Insulin Pen Needle 32G X 4 MM MISC Use as directed up to four times daily 200 each 8   No current facility-administered medications on file prior to visit.   Review of Systems: ROS negative except  for what is noted on the assessment and plan.  Vitals:   07/20/22 0932 07/20/22 0943 07/20/22 1018  BP: (!) 167/100 (!) 150/95 138/80  Pulse: (!) 104 (!) 105 96  Temp: 98.2 F (36.8 C)    TempSrc: Oral    SpO2: 100%    Weight: 261 lb 1.6 oz (118.4 kg)    Height: 5\' 7"  (1.702 m)     Physical Exam: Constitutional: well-appearing female sitting in chair comfortably, in no acute distress Cardiovascular: borderline tachycardia, normal rhythm Pulmonary/Chest: normal work of breathing on room air, lungs clear to auscultation bilaterally MSK: no LE edema  Neurological: alert & oriented x 3 Skin: warm and dry Psych: pleasant mood  Assessment & Plan:   Type 2 diabetes mellitus without complications (HCC) A1c today 8.7% from 8.5%. Patient reports taking Basaglar 25 units daily (instead of 30 units), Novolog 5 units AFTER meals, and Ozempic 2 mg weekly. She is not taking Jardiance due to frequent yeast infections while on it. Fasting glucose 65-80 most mornings. Denies any symptoms at that time. Advised patient to take Novolog before meals and that this could be contributing to her lows in the morning. Refills sent to pharmacy today.   Plan -Continue Basaglar 25 units daily  -Educated patient on Novolog 5 units before each meal -Continue Ozempic 2 mg weekly -Stopped Jardiance -F/u visit in 1 month to assess if further hypoglycemia -Repeat  A1c in 3 months  Hyperlipidemia associated with type 2 diabetes mellitus (HCC) Last LDL 149 in 03/2021. She is taking atorvastatin 40 mg daily.   Plan -Continue atorvastatin  -Repeat lipid panel today   Obesity, Class III, BMI 40-49.9 (morbid obesity) (HCC) BMI 40.89 and appears weight slightly down today. Remains on Ozempic 2 mg weekly. Counseled on lifestyle modifications including dietary habits and physical activity.   Iron deficiency anemia Patient supposed to be on iron supplementation but has not done so recently. States pharmacy did not fill  it. Will recheck iron panel and refilled her iron supplementation today.   Plan -Refilled po iron supplement -Iron studies today  Resistant hypertension BP did improve on repeat to 138/80 initially 167/100. States she has been taking Hyzaar 100-25 mg today but not the amlodipine 10 mg and carvedilol 12.5 mg. She has been out of amlodipine for 2 months and just recently filled it and taking it for past week. Given her current insurance, will consolidate her antihypertensives to reduce pill burden.   Plan -Stop individual amlodipine and Hyzaar -Consolidate to olmesartan-amlodipine-hydrochlorothiazide 40-10-25 mg daily  -Continue carvedilol BID -BMP today -Reassess BP at next visit in 1 month, repeat BMP then    Patient discussed with Dr. Docia Furl, D.O. [facility], PGY-2 Phone: [phone] Date 07/20/2022 Time 1:18 PM

## 2022-07-20 NOTE — Telephone Encounter (Signed)
Decision:Denied Your plan only covers this drug when you meet one of these options: A) You have tried other drugs your plan covers (preferred drugs), and they did not work well for you, or B) Your doctor gives [place] a medical reason you cannot take those other drugs. For your plan, you may need to try up to three preferred drugs. We have denied your request because you do not meet any of these conditions. We reviewed the information we had. Your request has been denied. Your doctor can send [place] any new or missing information for [place] to review. The preferred drug for your plan is: LANTUS. (Requirement: 3 in a class with 3 or more alternatives, 2 in a class with 2 alternatives, or 1 in a class with only 1 alternative.). Your doctor may need to get approval from your plan for preferred drugs. For this drug, you may have to meet other criteria. You can request the drug policy for more details. You can also request other plan documents for your review.

## 2022-07-20 NOTE — Assessment & Plan Note (Signed)
BP did improve on repeat to 138/80 initially 167/100. States she has been taking Hyzaar 100-25 mg today but not the amlodipine 10 mg and carvedilol 12.5 mg. She has been out of amlodipine for 2 months and just recently filled it and taking it for past week. Given her current insurance, will consolidate her antihypertensives to reduce pill burden.   Plan -Stop individual amlodipine and Hyzaar -Consolidate to olmesartan-amlodipine-hydrochlorothiazide 40-10-25 mg daily  -Continue carvedilol BID -BMP today -Reassess BP at next visit in 1 month, repeat BMP then

## 2022-07-20 NOTE — Assessment & Plan Note (Signed)
Last LDL 149 in 03/2021. She is taking atorvastatin 40 mg daily.   Plan -Continue atorvastatin  -Repeat lipid panel today

## 2022-07-20 NOTE — Assessment & Plan Note (Signed)
BMI 40.89 and appears weight slightly down today. Remains on Ozempic 2 mg weekly. Counseled on lifestyle modifications including dietary habits and physical activity.

## 2022-07-20 NOTE — Telephone Encounter (Signed)
Prior Authorization for patient (Basaglar Kwikpen) came through on cover my meds was submitted with last office notes and labs awaiting approval or denial 

## 2022-07-20 NOTE — Assessment & Plan Note (Signed)
Patient supposed to be on iron supplementation but has not done so recently. States pharmacy did not fill it. Will recheck iron panel and refilled her iron supplementation today.   Plan -Refilled po iron supplement -Iron studies today

## 2022-07-20 NOTE — Assessment & Plan Note (Signed)
A1c today 8.7% from 8.5%. Patient reports taking Basaglar 25 units daily (instead of 30 units), Novolog 5 units AFTER meals, and Ozempic 2 mg weekly. She is not taking Jardiance due to frequent yeast infections while on it. Fasting glucose 65-80 most mornings. Denies any symptoms at that time. Advised patient to take Novolog before meals and that this could be contributing to her lows in the morning. Refills sent to pharmacy today.   Plan -Continue Basaglar 25 units daily  -Educated patient on Novolog 5 units before each meal -Continue Ozempic 2 mg weekly -Stopped Jardiance -F/u visit in 1 month to assess if further hypoglycemia -Repeat A1c in 3 months

## 2022-07-22 LAB — FERRITIN: Ferritin: 30 ng/mL (ref 15–150)

## 2022-07-22 LAB — LIPID PANEL
Chol/HDL Ratio: 3.5 ratio (ref 0.0–4.4)
Cholesterol, Total: 216 mg/dL — ABNORMAL HIGH (ref 100–199)
HDL: 61 mg/dL (ref >39)
LDL Chol Calc (NIH): 146 mg/dL — ABNORMAL HIGH (ref 0–99)
Triglycerides: 51 mg/dL (ref 0–149)
VLDL Cholesterol Cal: 9 mg/dL (ref 5–40)

## 2022-07-22 LAB — BMP8+ANION GAP
Anion Gap: 19 mmol/L — ABNORMAL HIGH (ref 10.0–18.0)
BUN/Creatinine Ratio: 13 (ref 9–23)
BUN: 14 mg/dL (ref 6–24)
CO2: 20 mmol/L (ref 20–29)
Calcium: 9.7 mg/dL (ref 8.7–10.2)
Chloride: 97 mmol/L (ref 96–106)
Creatinine, Ser: 1.08 mg/dL — ABNORMAL HIGH (ref 0.57–1.00)
Glucose: 188 mg/dL — ABNORMAL HIGH (ref 70–99)
Potassium: 4.1 mmol/L (ref 3.5–5.2)
Sodium: 136 mmol/L (ref 134–144)
eGFR: 65 mL/min/{1.73_m2} (ref >59)

## 2022-07-22 LAB — IRON AND TIBC
Iron Saturation: 9 % — CL (ref 15–55)
Iron: 31 ug/dL (ref 27–159)
Total Iron Binding Capacity: 358 ug/dL (ref 250–450)
UIBC: 327 ug/dL (ref 131–425)

## 2022-07-25 MED ORDER — INSULIN PEN NEEDLE 32G X 4 MM MISC: 1.0000 | Freq: Every day | 8 refills | Status: DC

## 2022-07-25 MED ORDER — INSULIN GLARGINE 100 UNIT/ML SOLOSTAR PEN
25.0000 [IU] | PEN_INJECTOR | Freq: Every day | SUBCUTANEOUS | 5 refills | Status: DC
Start: 2022-07-25 — End: 2023-07-24

## 2022-07-25 NOTE — Addendum Note (Signed)
Addended by: Rana Snare on: 07/25/2022 03:38 PM   Modules accepted: Orders

## 2022-07-25 NOTE — Progress Notes (Signed)
Internal Medicine Clinic Attending  Case discussed with the resident at the time of the visit.  We reviewed the resident's history and exam and pertinent patient test results.  I agree with the assessment, diagnosis, and plan of care documented in the resident's note.  

## 2022-08-10 ENCOUNTER — Other Ambulatory Visit: Payer: Self-pay | Admitting: Student

## 2022-08-10 ENCOUNTER — Ambulatory Visit (INDEPENDENT_AMBULATORY_CARE_PROVIDER_SITE_OTHER): Payer: BC Managed Care – PPO | Admitting: Student

## 2022-08-10 ENCOUNTER — Encounter: Payer: Self-pay | Admitting: Student

## 2022-08-10 ENCOUNTER — Other Ambulatory Visit: Payer: Self-pay

## 2022-08-10 VITALS — BP 111/68 | HR 100 | Temp 98.4°F | Resp 28 | Ht 67.0 in | Wt 264.0 lb

## 2022-08-10 DIAGNOSIS — I1A0 Resistant hypertension: Secondary | ICD-10-CM

## 2022-08-10 DIAGNOSIS — N644 Mastodynia: Secondary | ICD-10-CM

## 2022-08-10 DIAGNOSIS — Z9189 Other specified personal risk factors, not elsewhere classified: Secondary | ICD-10-CM | POA: Insufficient documentation

## 2022-08-10 DIAGNOSIS — N6314 Unspecified lump in the right breast, lower inner quadrant: Secondary | ICD-10-CM

## 2022-08-10 DIAGNOSIS — I5032 Chronic diastolic (congestive) heart failure: Secondary | ICD-10-CM | POA: Diagnosis not present

## 2022-08-10 DIAGNOSIS — E1169 Type 2 diabetes mellitus with other specified complication: Secondary | ICD-10-CM | POA: Diagnosis not present

## 2022-08-10 DIAGNOSIS — I110 Hypertensive heart disease with heart failure: Secondary | ICD-10-CM

## 2022-08-10 DIAGNOSIS — Z794 Long term (current) use of insulin: Secondary | ICD-10-CM

## 2022-08-10 DIAGNOSIS — E785 Hyperlipidemia, unspecified: Secondary | ICD-10-CM

## 2022-08-10 DIAGNOSIS — Z7985 Long-term (current) use of injectable non-insulin antidiabetic drugs: Secondary | ICD-10-CM

## 2022-08-10 DIAGNOSIS — E119 Type 2 diabetes mellitus without complications: Secondary | ICD-10-CM

## 2022-08-10 NOTE — Assessment & Plan Note (Signed)
BP well controlled today at 111/68. Taking olmesartan-amlodipine-hydrochlorothiazide 40-10-25 mg daily and carvedilol 12.5 mg BID. Tolerating all well and reports better/daily adherence. Asymptomatic and feeling well today. BMP stable at last OV.   Plan -Continue olmesartan-amlodipine-hydrochlorothiazide and carvedilol

## 2022-08-10 NOTE — Assessment & Plan Note (Signed)
STOP-BANG score today was intermediate/high. Reports loud snoring and daytime fatigue/drowsiness. On antihypertensives and BMI 41. Discussed option for sleep study to further evaluate and patient agrees.  Plan -Referral for sleep study placed

## 2022-08-10 NOTE — Patient Instructions (Addendum)
Thank you, Ms.Takeshia FELIX PRATT for allowing [place] to provide your care today. Today we discussed:  -Blood pressure doing well today. Continue taking olmesartan-amlodipine-hydrochlorothiazide and carvedilol.  -Continue Basaglar and Novolog. Continue Ozempic 2 mg weekly  -Set up your Dexcom for blood sugar monitoring and hopefully get appointment with Lupita Leash to discuss diabetes. -Follow up with your cardiologist, office number [phone]) -Discussed sleep study for you.  -[facility] about [facility]   Follow up:  2.5 months     Should you have any questions or concerns please call the internal medicine clinic at [phone].    Rana Snare, D.O. [facility]

## 2022-08-10 NOTE — Assessment & Plan Note (Signed)
LDL 146 earlier this month, patient clarified she was not taking atorvastatin 40 mg consistently at last OV. Encouraged patient to take atorvastatin which she agreed to. Plan to reassess in 2-3 months once consistently on statin therapy.   Plan -Continue atorvastatin

## 2022-08-10 NOTE — Assessment & Plan Note (Addendum)
Last A1c 8.7% earlier this month. At last OV, noted hypoglycemia likely due to taking Novolog after meals. Currently taking Basaglar 25 units, Novolog 5 units before meals and Ozempic 2 mg weekly. Did not bring meter today. States fasting glucose around 90s. Denies any further episodes of hypoglycemia. Has CGM but has not set it up yet which I encouraged Kim Werner to do so and meet with Kim Werner.   Plan -Continue Basaglar 25 units and Novolog 5 units before meals  -Continue Ozempic -Start using CGM -F/u appt with Kim Werner -Repeat A1c in 2.5 months at f/u visit

## 2022-08-10 NOTE — Assessment & Plan Note (Signed)
Patient denies any breast pain today or recently. Mammogram placed at 1/25 OV has been scheduled.

## 2022-08-10 NOTE — Assessment & Plan Note (Signed)
Stable. No acute concerns today. Denies chest pain, SOB, leg swelling. Weight has been steady. Last saw cardiology in November with plans to f/u in 6 months. Encouraged patient to schedule f/u visit with their office. BP well controlled today and patient has been more adherent to her medications.

## 2022-08-10 NOTE — Progress Notes (Signed)
CC: routine visit  HPI:  [name] is a 47 y.o. female living with a history stated below and presents today for routine visit. Please see problem based assessment and plan for additional details.  Past Medical History:  Diagnosis Date   Candida vaginitis 12/03/2021   Diabetes mellitus without complication (HCC)    Hyperlipidemia associated with type 2 diabetes mellitus (HCC)    Hypertensive heart disease with chronic diastolic congestive heart failure (HCC) 03/2021   Severe LVH with EF 50-55%: Has been admitted twice with profound hypotension, most recentlyPulm edema in July 2023.   Morbid obesity due to excess calories (HCC)    Resistant hypertension    On high-dose carvedilol, losartan, HCTZ and amlodipine    Current Outpatient Medications on File Prior to Visit  Medication Sig Dispense Refill   Accu-Chek Softclix Lancets lancets Use as directed up to four times daily 100 each 0   Acetaminophen (TYLENOL PO) Take 2 tablets by mouth as needed (period cramps).     atorvastatin (LIPITOR) 40 MG tablet Take 1 tablet (40 mg total) by mouth daily. 90 tablet 3   blood glucose meter kit and supplies Dispense based on patient and insurance preference. Use up to four times daily as directed. (FOR ICD-10 E10.9, E11.9). 1 each 0   Blood Glucose Monitoring Suppl (ACCU-CHEK GUIDE ME) w/Device KIT Use as directed up to four times daily. 1 kit 0   Calcium Carbonate Antacid (TUMS PO) Take 2 tablets by mouth as needed (heartburn).     carvedilol (COREG) 12.5 MG tablet Take 1 tablet (12.5 mg total) by mouth 2 (two) times daily with a meal. 180 tablet 3   Continuous Blood Gluc Receiver (DEXCOM G7 RECEIVER) DEVI Please use as directed to check your blood sugar. 1 each 0   Continuous Blood Gluc Sensor (DEXCOM G7 SENSOR) MISC Please use as directed to check your blood sugar. 3 each 11   ferrous sulfate 325 (65 FE) MG tablet Take 1 tablet (325 mg total) by mouth every other day. 30 tablet 5    glucose blood test strip Use as directed up to four times daily 100 each 0   insulin aspart (NOVOLOG) 100 UNIT/ML FlexPen Inject 5 Units into the skin 3 (three) times daily before meals. 15 mL 11   insulin glargine (LANTUS) 100 UNIT/ML Solostar Pen Inject 25 Units into the skin at bedtime. 15 mL 5   Insulin Pen Needle 32G X 4 MM MISC Use as directed up to four times daily 200 each 8   Olmesartan-amLODIPine-HCTZ 40-10-25 MG TABS Take 1 tablet by mouth daily. 90 tablet 3   Semaglutide, 2 MG/DOSE, (OZEMPIC, 2 MG/DOSE,) 8 MG/3ML SOPN Inject 2 mg into the skin once a week. 6 mL 6   No current facility-administered medications on file prior to visit.   Review of Systems: ROS negative except for what is noted on the assessment and plan.  Vitals:   08/10/22 1009  BP: 111/68  Pulse: 100  Resp: (!) 28  Temp: 98.4 F (36.9 C)  TempSrc: Oral  SpO2: 99%  Weight: 264 lb (119.7 kg)  Height: 5\' 7"  (1.702 m)   Physical Exam: Constitutional: well-appearing female sitting in chair comfortably, in no acute distress Cardiovascular: regular rate and rhythm Pulmonary/Chest: normal work of breathing on room air, lungs clear to auscultation bilaterally MSK: no LE edema Neurological: alert & oriented x 3 Skin: warm and dry Psych: pleasant mood  Assessment & Plan:   Resistant hypertension  BP well controlled today at 111/68. Taking olmesartan-amlodipine-hydrochlorothiazide 40-10-25 mg daily and carvedilol 12.5 mg BID. Tolerating all well and reports better/daily adherence. Asymptomatic and feeling well today. BMP stable at last OV.   Plan -Continue olmesartan-amlodipine-hydrochlorothiazide and carvedilol   Hypertensive heart disease with chronic diastolic congestive heart failure (HCC) Stable. No acute concerns today. Denies chest pain, SOB, leg swelling. Weight has been steady. Last saw cardiology in November with plans to f/u in 6 months. Encouraged patient to schedule f/u visit with their office. BP  well controlled today and patient has been more adherent to her medications.   Type 2 diabetes mellitus without complications (HCC) Last A1c 8.7% earlier this month. At last OV, noted hypoglycemia likely due to taking Novolog after meals. Currently taking Basaglar 25 units, Novolog 5 units before meals and Ozempic 2 mg weekly. Did not bring meter today. States fasting glucose around 90s. Denies any further episodes of hypoglycemia. Has CGM but has not set it up yet which I encouraged her to do so and meet with Lupita Leash.   Plan -Continue Basaglar 25 units and Novolog 5 units before meals  -Continue Ozempic -Start using CGM -F/u appt with Lupita Leash -Repeat A1c in 2.5 months at f/u visit   Hyperlipidemia associated with type 2 diabetes mellitus (HCC) LDL 146 earlier this month, patient clarified she was not taking atorvastatin 40 mg consistently at last OV. Encouraged patient to take atorvastatin which she agreed to. Plan to reassess in 2-3 months once consistently on statin therapy.   Plan -Continue atorvastatin   At risk for sleep apnea STOP-BANG score today was intermediate/high. Reports loud snoring and daytime fatigue/drowsiness. On antihypertensives and BMI 41. Discussed option for sleep study to further evaluate and patient agrees.  Plan -Referral for sleep study placed  Breast pain Patient denies any breast pain today or recently. Mammogram placed at 1/25 OV has been scheduled.    Patient discussed with Dr. Rollene Fare, D.O. [facility], PGY-2 Phone: [phone] Date 08/10/2022 Time 7:53 PM

## 2022-08-17 NOTE — Progress Notes (Signed)
Internal Medicine Clinic Attending  Case discussed with the resident at the time of the visit.  We reviewed the resident's history and exam and pertinent patient test results.  I agree with the assessment, diagnosis, and plan of care documented in the resident's note.  

## 2022-08-22 ENCOUNTER — Other Ambulatory Visit: Payer: BC Managed Care – PPO

## 2022-08-25 ENCOUNTER — Other Ambulatory Visit: Payer: BC Managed Care – PPO

## 2022-09-01 ENCOUNTER — Other Ambulatory Visit: Payer: Self-pay | Admitting: Student

## 2022-09-01 ENCOUNTER — Ambulatory Visit
Admission: RE | Admit: 2022-09-01 | Discharge: 2022-09-01 | Disposition: A | Discharge status: Home / Self Care | Payer: BC Managed Care – PPO | Source: Ambulatory Visit | Attending: Internal Medicine | Admitting: Internal Medicine

## 2022-09-01 DIAGNOSIS — N6314 Unspecified lump in the right breast, lower inner quadrant: Secondary | ICD-10-CM

## 2022-09-01 DIAGNOSIS — N644 Mastodynia: Secondary | ICD-10-CM

## 2022-09-01 DIAGNOSIS — N6489 Other specified disorders of breast: Secondary | ICD-10-CM

## 2022-09-01 DIAGNOSIS — R921 Mammographic calcification found on diagnostic imaging of breast: Secondary | ICD-10-CM

## 2022-09-01 DIAGNOSIS — N6320 Unspecified lump in the left breast, unspecified quadrant: Secondary | ICD-10-CM

## 2022-09-02 ENCOUNTER — Ambulatory Visit
Admission: RE | Admit: 2022-09-02 | Discharge: 2022-09-02 | Disposition: A | Discharge status: Home / Self Care | Payer: BC Managed Care – PPO | Source: Ambulatory Visit | Attending: Internal Medicine | Admitting: Internal Medicine

## 2022-09-02 ENCOUNTER — Ambulatory Visit: Admission: RE | Admit: 2022-09-02 | Payer: BC Managed Care – PPO | Source: Ambulatory Visit

## 2022-09-02 ENCOUNTER — Other Ambulatory Visit (HOSPITAL_COMMUNITY)
Admission: RE | Admit: 2022-09-02 | Discharge: 2022-09-02 | Disposition: A | Discharge status: Home / Self Care | Payer: BC Managed Care – PPO | Source: Ambulatory Visit | Attending: Internal Medicine | Admitting: Internal Medicine

## 2022-09-02 DIAGNOSIS — N6320 Unspecified lump in the left breast, unspecified quadrant: Secondary | ICD-10-CM

## 2022-09-02 DIAGNOSIS — N644 Mastodynia: Secondary | ICD-10-CM

## 2022-09-02 DIAGNOSIS — N6314 Unspecified lump in the right breast, lower inner quadrant: Secondary | ICD-10-CM | POA: Insufficient documentation

## 2022-09-02 DIAGNOSIS — N6489 Other specified disorders of breast: Secondary | ICD-10-CM

## 2022-09-02 DIAGNOSIS — R921 Mammographic calcification found on diagnostic imaging of breast: Secondary | ICD-10-CM

## 2022-09-02 HISTORY — PX: BREAST BIOPSY: SHX20

## 2022-09-05 LAB — CYTOLOGY - NON PAP

## 2022-09-06 ENCOUNTER — Other Ambulatory Visit: Payer: Self-pay | Admitting: Student

## 2022-09-06 ENCOUNTER — Encounter: Payer: Self-pay | Admitting: Student

## 2022-09-06 ENCOUNTER — Inpatient Hospital Stay: Payer: BC Managed Care – PPO

## 2022-09-06 DIAGNOSIS — R921 Mammographic calcification found on diagnostic imaging of breast: Secondary | ICD-10-CM

## 2022-09-06 DIAGNOSIS — N6314 Unspecified lump in the right breast, lower inner quadrant: Secondary | ICD-10-CM

## 2022-09-06 DIAGNOSIS — N644 Mastodynia: Secondary | ICD-10-CM

## 2022-09-06 DIAGNOSIS — N6320 Unspecified lump in the left breast, unspecified quadrant: Secondary | ICD-10-CM

## 2022-09-06 DIAGNOSIS — N6489 Other specified disorders of breast: Secondary | ICD-10-CM

## 2022-09-07 ENCOUNTER — Other Ambulatory Visit: Payer: Self-pay | Admitting: Student

## 2022-09-07 DIAGNOSIS — N6489 Other specified disorders of breast: Secondary | ICD-10-CM

## 2022-09-28 ENCOUNTER — Telehealth: Payer: Self-pay

## 2022-09-28 NOTE — Telephone Encounter (Signed)
Pt is requesting  a call back .Kim Werner She stated that she has been having nasal congestion along with eye pain ...  She has an upcoming appt 10/16

## 2022-09-28 NOTE — Telephone Encounter (Signed)
RTC to patient is having nasal congestion and eye pain on the right side of her face.  No fevers.  Has had for 2 months.  Is taking something OTC for symptoms for folks with high blood pressure.   Wants to know what else she can take.

## 2022-09-29 ENCOUNTER — Encounter: Payer: BC Managed Care – PPO | Admitting: Internal Medicine

## 2022-09-29 NOTE — Telephone Encounter (Signed)
RTC to patient states has tried Flonase and this does not help.

## 2022-10-05 ENCOUNTER — Encounter: Payer: Self-pay | Admitting: Internal Medicine

## 2022-10-05 ENCOUNTER — Other Ambulatory Visit: Payer: Self-pay

## 2022-10-05 ENCOUNTER — Ambulatory Visit (INDEPENDENT_AMBULATORY_CARE_PROVIDER_SITE_OTHER): Payer: BC Managed Care – PPO | Admitting: Internal Medicine

## 2022-10-05 VITALS — BP 123/81 | HR 94 | Temp 98.1°F | Ht 67.0 in | Wt 267.3 lb

## 2022-10-05 DIAGNOSIS — E119 Type 2 diabetes mellitus without complications: Secondary | ICD-10-CM

## 2022-10-05 DIAGNOSIS — Z794 Long term (current) use of insulin: Secondary | ICD-10-CM

## 2022-10-05 DIAGNOSIS — I1A0 Resistant hypertension: Secondary | ICD-10-CM | POA: Diagnosis not present

## 2022-10-05 DIAGNOSIS — Z23 Encounter for immunization: Secondary | ICD-10-CM

## 2022-10-05 MED ORDER — OLMESARTAN-AMLODIPINE-HCTZ 40-10-25 MG PO TABS: 1.0000 | ORAL_TABLET | Freq: Every day | ORAL | 3 refills | Status: DC

## 2022-10-05 MED ORDER — MOUNJARO 5 MG/0.5ML ~~LOC~~ SOAJ: 5.0000 mg | SUBCUTANEOUS | 20 refills | Status: DC

## 2022-10-05 NOTE — Telephone Encounter (Signed)
Appt made for 9/18 with Dr Carlynn Purl

## 2022-10-05 NOTE — Patient Instructions (Addendum)
Kim Werner,   It was a pleasure meeting you today.   I am prescribing a medication called Mounjaro. You can stop taking your Ozempic.   For your blood pressure, please continue taking your olmesartan-amlodipine-hydrochlorothiazide, and stop taking the carvedilol.   We will plan to see you again in about 3 months to see how your new medication is working.   Thanks,  Dr Carlynn Purl

## 2022-10-05 NOTE — Progress Notes (Signed)
Subjective:  CC: medication follow-up, A1c  HPI:  [name] is a 47 y.o. female with a past medical history stated below and presents today for above. Please see problem based assessment and plan for additional details.  Past Medical History:  Diagnosis Date   Candida vaginitis 12/03/2021   Diabetes mellitus without complication (HCC)    Hyperlipidemia associated with type 2 diabetes mellitus (HCC)    Hypertensive heart disease with chronic diastolic congestive heart failure (HCC) 03/2021   Severe LVH with EF 50-55%: Has been admitted twice with profound hypotension, most recentlyPulm edema in July 2023.   Morbid obesity due to excess calories (HCC)    Resistant hypertension    On high-dose carvedilol, losartan, HCTZ and amlodipine    Current Outpatient Medications on File Prior to Visit  Medication Sig Dispense Refill   Accu-Chek Softclix Lancets lancets Use as directed up to four times daily 100 each 0   Acetaminophen (TYLENOL PO) Take 2 tablets by mouth as needed (period cramps).     atorvastatin (LIPITOR) 40 MG tablet Take 1 tablet (40 mg total) by mouth daily. 90 tablet 3   blood glucose meter kit and supplies Dispense based on patient and insurance preference. Use up to four times daily as directed. (FOR ICD-10 E10.9, E11.9). 1 each 0   Blood Glucose Monitoring Suppl (ACCU-CHEK GUIDE ME) w/Device KIT Use as directed up to four times daily. 1 kit 0   Calcium Carbonate Antacid (TUMS PO) Take 2 tablets by mouth as needed (heartburn).     Continuous Blood Gluc Receiver (DEXCOM G7 RECEIVER) DEVI Please use as directed to check your blood sugar. 1 each 0   Continuous Blood Gluc Sensor (DEXCOM G7 SENSOR) MISC Please use as directed to check your blood sugar. 3 each 11   ferrous sulfate 325 (65 FE) MG tablet Take 1 tablet (325 mg total) by mouth every other day. 30 tablet 5   glucose blood test strip Use as directed up to four times daily 100 each 0   insulin aspart  (NOVOLOG) 100 UNIT/ML FlexPen Inject 5 Units into the skin 3 (three) times daily before meals. 15 mL 11   insulin glargine (LANTUS) 100 UNIT/ML Solostar Pen Inject 25 Units into the skin at bedtime. 15 mL 5   Insulin Pen Needle 32G X 4 MM MISC Use as directed up to four times daily 200 each 8   No current facility-administered medications on file prior to visit.    Review of Systems: ROS negative except for as is noted on the assessment and plan.  Objective:   Vitals:   10/05/22 1554  BP: 123/81  Pulse: 94  Temp: 98.1 F (36.7 C)  TempSrc: Oral  SpO2: 100%  Weight: 267 lb 4.8 oz (121.2 kg)  Height: 5\' 7"  (1.702 m)    Physical Exam: Constitutional: well-appearing, in no acute distress HENT: normocephalic atraumatic, mucous membranes moist Eyes: conjunctiva non-erythematous Neck: supple Cardiovascular: regular rate and rhythm, no m/r/g Pulmonary/Chest: normal work of breathing on room air, lungs clear to auscultation bilaterally Abdominal: soft, non-tender, non-distended MSK: normal bulk and tone Neurological: alert & oriented x 3, 5/5 strength in bilateral upper and lower extremities, normal gait Skin: warm and dry  Assessment & Plan:   Resistant hypertension BP is 123/81. Current regimen is olmesartan-amlodipine-hydrochlorothiazide 40-10-25. Patient is additionally prescribed carvedilol, though patient did not know about this medication and has not been taking it. Discontinued carvedilol as BP is at goal, though patient  will seen by cardiology for diastolic congestive heart failure and might restart if indicated.  -Continue olmesartan-amlodipine-hydrochlorothiazide only  Type 2 diabetes mellitus without complications (HCC) A1c at last visit was 8.7. Current regimen includes Ozempic 2mg , Basaglar 25 units daily and Novolog 5 units with meals. Patient checks her blood sugars irregularly, denies ever having BG readings or having symptoms of hypoglycemia. Patient feels she has  not been losing weight on Ozempic and would like to switch to another medication.  -A1c today -Stop Ozempic, start Mounjaro 5mg     Patient seen with Dr. Rance Muir MD [facility]  PGY-1 Pager: [phone] Date 10/05/2022  Time 4:25 PM

## 2022-10-05 NOTE — Assessment & Plan Note (Signed)
A1c at last visit was 8.7. Current regimen includes Ozempic 2mg , Basaglar 25 units daily and Novolog 5 units with meals. Patient checks her blood sugars irregularly, denies ever having BG readings or having symptoms of hypoglycemia. Patient feels she has not been losing weight on Ozempic and would like to switch to another medication.  -A1c today -Stop Ozempic, start Mounjaro 5mg 

## 2022-10-05 NOTE — Assessment & Plan Note (Addendum)
BP is 123/81. Current regimen is olmesartan-amlodipine-hydrochlorothiazide 40-10-25. Patient is additionally prescribed carvedilol, though patient did not know about this medication and has not been taking it. Discontinued carvedilol as BP is at goal, though patient will seen by cardiology for diastolic congestive heart failure and might restart if indicated.  -Continue olmesartan-amlodipine-hydrochlorothiazide only

## 2022-10-06 LAB — HEMOGLOBIN A1C
Est. average glucose Bld gHb Est-mCnc: 278 mg/dL
Hgb A1c MFr Bld: 11.3 % — ABNORMAL HIGH (ref 4.8–5.6)

## 2022-10-07 NOTE — Addendum Note (Signed)
Addended by: Dickie La on: 10/07/2022 09:09 AM   Modules accepted: Level of Service

## 2022-10-07 NOTE — Progress Notes (Signed)
Internal Medicine Clinic Attending  I was physically present during the key portions of the resident provided service and participated in the medical decision making of patient's management care. I reviewed pertinent patient test results.  The assessment, diagnosis, and plan were formulated together and I agree with the documentation in the resident's note.  Continued need for improved diabetes control. Agree with switch to tirzepatide as may allow for further titration. Ms. Phoenix reports having a CGM but did not bring this with her today. We have schedule short-term f/u to review BG and discuss changes as needed.  Dickie La, MD

## 2022-11-02 ENCOUNTER — Encounter: Payer: Self-pay | Admitting: Internal Medicine

## 2022-11-02 ENCOUNTER — Ambulatory Visit: Payer: BC Managed Care – PPO | Admitting: Internal Medicine

## 2022-11-02 VITALS — BP 143/84 | HR 95 | Temp 98.3°F | Wt 275.9 lb

## 2022-11-02 DIAGNOSIS — Z7985 Long-term (current) use of injectable non-insulin antidiabetic drugs: Secondary | ICD-10-CM

## 2022-11-02 DIAGNOSIS — Z794 Long term (current) use of insulin: Secondary | ICD-10-CM | POA: Diagnosis not present

## 2022-11-02 DIAGNOSIS — I1A0 Resistant hypertension: Secondary | ICD-10-CM

## 2022-11-02 DIAGNOSIS — E1169 Type 2 diabetes mellitus with other specified complication: Secondary | ICD-10-CM

## 2022-11-02 MED ORDER — MOUNJARO 5 MG/0.5ML ~~LOC~~ SOAJ: 5.0000 mg | SUBCUTANEOUS | 20 refills | Status: DC

## 2022-11-02 MED ORDER — MOUNJARO 5 MG/0.5ML ~~LOC~~ SOAJ: 2.5000 mg | SUBCUTANEOUS | 20 refills | Status: DC

## 2022-11-02 MED ORDER — OLMESARTAN-AMLODIPINE-HCTZ 40-10-25 MG PO TABS
1.0000 | ORAL_TABLET | Freq: Every day | ORAL | 3 refills | Status: DC
Start: 2022-11-02 — End: 2023-04-27

## 2022-11-02 NOTE — Patient Instructions (Signed)
Thank you, [name] for allowing [place] to provide your care today. Today we discussed:  Diabetes Continue Basaglar 25u daily Continue Novolog 5u with meals Start Mounjaro 2.5 mg/week  High blood pressure Refilled medicine  I have ordered the following labs for you:   Lab Orders         Microalbumin / Creatinine Urine Ratio      Referrals ordered today:   Referral Orders  No referral(s) requested today     I have ordered the following medication/changed the following medications:   Stop the following medications: Medications Discontinued During This Encounter  Medication Reason   tirzepatide (MOUNJARO) 5 MG/0.5ML Pen Reorder   Olmesartan-amLODIPine-HCTZ 40-10-25 MG TABS Reorder     Start the following medications: Meds ordered this encounter  Medications   tirzepatide (MOUNJARO) 5 MG/0.5ML Pen    Sig: Inject 5 mg into the skin once a week.    Dispense:  0.5 mL    Refill:  20   Olmesartan-amLODIPine-HCTZ 40-10-25 MG TABS    Sig: Take 1 tablet by mouth daily.    Dispense:  90 tablet    Refill:  3     Follow up: 2 months    Should you have any questions or concerns please call the internal medicine clinic at [phone].     Elza Rafter, D.O. [facility]

## 2022-11-02 NOTE — Assessment & Plan Note (Signed)
Blood pressure is elevated to 143/84.  Her current regimen includes olmesartan-amlodipine-HCTZ 40 - 10 - 25 mg daily.  She did not take her medication today, as she was rushing to her appointment.  We will not make any medication adjustments today, but we will keep a close eye on her blood pressure.

## 2022-11-02 NOTE — Assessment & Plan Note (Addendum)
The patient presents for a follow-up of her type 2 dm associated with HTN and HLD.  At her last office visit 1 month ago her A1c was 11.3%.  She was advised to continue her Basaglar 25 units daily, NovoLog 5 units with meals, and was switched from Ozempic to [place].  The patient was not able to pick up her [facility], as it was sent to the wrong pharmacy, but she did not notify anybody of this until her appointment today.  Plan: -Urine micro today -Continue Basaglar 25 units daily and NovoLog 5 units with meals - Restart Mounjaro 2.5 mg/week - Follow-up in 2 months

## 2022-11-02 NOTE — Progress Notes (Signed)
CC: 1 month follow up   HPI:  [name] is a 47 y.o. female living with a history stated below and presents today for a 1 month follow up of her diabetes. Please see problem based assessment and plan for additional details.  Past Medical History:  Diagnosis Date   Candida vaginitis 12/03/2021   Diabetes mellitus without complication (HCC)    Hyperlipidemia associated with type 2 diabetes mellitus (HCC)    Hypertensive heart disease with chronic diastolic congestive heart failure (HCC) 03/2021   Severe LVH with EF 50-55%: Has been admitted twice with profound hypotension, most recentlyPulm edema in July 2023.   Morbid obesity due to excess calories (HCC)    Resistant hypertension    On high-dose carvedilol, losartan, HCTZ and amlodipine    Current Outpatient Medications on File Prior to Visit  Medication Sig Dispense Refill   Accu-Chek Softclix Lancets lancets Use as directed up to four times daily 100 each 0   Acetaminophen (TYLENOL PO) Take 2 tablets by mouth as needed (period cramps).     atorvastatin (LIPITOR) 40 MG tablet Take 1 tablet (40 mg total) by mouth daily. 90 tablet 3   blood glucose meter kit and supplies Dispense based on patient and insurance preference. Use up to four times daily as directed. (FOR ICD-10 E10.9, E11.9). 1 each 0   Blood Glucose Monitoring Suppl (ACCU-CHEK GUIDE ME) w/Device KIT Use as directed up to four times daily. 1 kit 0   Calcium Carbonate Antacid (TUMS PO) Take 2 tablets by mouth as needed (heartburn).     Continuous Blood Gluc Receiver (DEXCOM G7 RECEIVER) DEVI Please use as directed to check your blood sugar. 1 each 0   Continuous Blood Gluc Sensor (DEXCOM G7 SENSOR) MISC Please use as directed to check your blood sugar. 3 each 11   ferrous sulfate 325 (65 FE) MG tablet Take 1 tablet (325 mg total) by mouth every other day. 30 tablet 5   glucose blood test strip Use as directed up to four times daily 100 each 0   insulin aspart  (NOVOLOG) 100 UNIT/ML FlexPen Inject 5 Units into the skin 3 (three) times daily before meals. 15 mL 11   insulin glargine (LANTUS) 100 UNIT/ML Solostar Pen Inject 25 Units into the skin at bedtime. 15 mL 5   Insulin Pen Needle 32G X 4 MM MISC Use as directed up to four times daily 200 each 8   No current facility-administered medications on file prior to visit.    No family history on file.  Social History   Socioeconomic History   Marital status: Married    Spouse name: Not on file   Number of children: Not on file   Years of education: Not on file   Highest education level: Not on file  Occupational History   Not on file  Tobacco Use   Smoking status: Never   Smokeless tobacco: Never  Vaping Use   Vaping status: Never Used  Substance and Sexual Activity   Alcohol use: Never   Drug use: Never   Sexual activity: Yes  Other Topics Concern   Not on file  Social History Narrative   Recently moved from [place] to [place].   Lives with her husband   She teaches eighth grade   Social Determinants of Health   Financial Resource Strain: Medium Risk (02/10/2022)   Overall Financial Resource Strain (CARDIA)    Difficulty of Paying Living Expenses: Somewhat hard  Food Insecurity: Food Insecurity Present (02/10/2022)   Hunger Vital Sign    Worried About Running Out of Food in the Last Year: Often true    Ran Out of Food in the Last Year: Often true  Transportation Needs: No Transportation Needs (02/10/2022)   PRAPARE - Administrator, Civil Service (Medical): No    Lack of Transportation (Non-Medical): No  Physical Activity: Inactive (02/10/2022)   Exercise Vital Sign    Days of Exercise per Week: 0 days    Minutes of Exercise per Session: 0 min  Stress: No Stress Concern Present (02/10/2022)   [employer] of Occupational Health - Occupational Stress Questionnaire    Feeling of Stress : Only a little  Social Connections: Moderately Integrated  (02/10/2022)   Social Connection and Isolation Panel [NHANES]    Frequency of Communication with Friends and Family: More than three times a week    Frequency of Social Gatherings with Friends and Family: Patient declined    Attends Religious Services: More than 4 times per year    Active Member of [employer] or Organizations: No    Attends Banker Meetings: Never    Marital Status: Married  Catering manager Violence: Not At Risk (02/10/2022)   Humiliation, Afraid, Rape, and Kick questionnaire    Fear of Current or Ex-Partner: No    Emotionally Abused: No    Physically Abused: No    Sexually Abused: No    Review of Systems: ROS negative except for what is noted on the assessment and plan.  Vitals:   11/02/22 1528  BP: (!) 143/84  Pulse: 95  Temp: 98.3 F (36.8 C)  TempSrc: Oral  SpO2: 94%  Weight: 275 lb 14.4 oz (125.1 kg)    Physical Exam: Constitutional: well-appearing, obese female sitting in chair, in no acute distress Cardiovascular: regular rate and rhythm, no m/r/g Pulmonary/Chest: normal work of breathing on room air MSK: normal bulk and tone Skin: warm and dry Psych: normal mood and behavior  Assessment & Plan:    Patient discussed with Dr. Heide Spark  Type 2 diabetes mellitus with other specified complication [facility]) The patient presents for a follow-up of her type 2 dm associated with HTN and HLD.  At her last office visit 1 month ago her A1c was 11.3%.  She was advised to continue her Basaglar 25 units daily, NovoLog 5 units with meals, and was switched from Ozempic to [place].  The patient was not able to pick up her [facility], as it was sent to the wrong pharmacy, but she did not notify anybody of this until her appointment today.  Plan: -Urine micro today -Continue Basaglar 25 units daily and NovoLog 5 units with meals - Restart Mounjaro 2.5 mg/week - Follow-up in 2 months  Resistant hypertension Blood pressure is elevated to 143/84.  Her current  regimen includes olmesartan-amlodipine-HCTZ 40 - 10 - 25 mg daily.  She did not take her medication today, as she was rushing to her appointment.  We will not make any medication adjustments today, but we will keep a close eye on her blood pressure.   Elza Rafter, D.O. [facility], PGY-3 Phone: [phone] Date 11/02/2022 Time 5:31 PM

## 2022-11-03 ENCOUNTER — Telehealth: Payer: Self-pay

## 2022-11-03 LAB — MICROALBUMIN / CREATININE URINE RATIO
Creatinine, Urine: 55.4 mg/dL
Microalb/Creat Ratio: 13 mg/g{creat} (ref 0–29)
Microalbumin, Urine: 7.2 ug/mL

## 2022-11-03 NOTE — Progress Notes (Signed)
Normal ratio. No changes made.

## 2022-11-03 NOTE — Telephone Encounter (Signed)
Decision:Approved  Kim Werner (Kim Werner) PA Case ID #: [id number] Rx #: [id number] Need Help? Call us at [phone] Outcome Approved today by [facility] NCPDP 2017 Your PA request has been approved. Additional information will be provided in the approval communication. (Message 1145) Authorization Expiration Date: 11/01/2025 Drug Mounjaro 5MG /0.5ML auto-injectors ePA cloud Psychologist, educational Electronic PA Form [id number] NCPDP) Original Claim Info 75

## 2022-11-03 NOTE — Telephone Encounter (Signed)
Prior Authorization for patient Kim Werner 5MG /0.5ML) came through on cover my meds was submitted with last office notes and labs awaiting approval or denial.  WUJ:[id number]

## 2022-11-08 NOTE — Progress Notes (Signed)
Internal Medicine Clinic Attending  Case discussed with the resident at the time of the visit.  We reviewed the resident's history and exam and pertinent patient test results.  I agree with the assessment, diagnosis, and plan of care documented in the resident's note.  

## 2022-11-23 ENCOUNTER — Telehealth: Payer: Self-pay | Admitting: *Deleted

## 2022-11-23 DIAGNOSIS — N949 Unspecified condition associated with female genital organs and menstrual cycle: Secondary | ICD-10-CM

## 2022-11-23 NOTE — Telephone Encounter (Signed)
Call from pt stating she has a yeast infection and needs medication.  Informed pt she may need an appt; she stated Dr Sherrilee Gilles had prescribed one before and does not need an appt . Stated she diabetic.

## 2022-11-28 ENCOUNTER — Other Ambulatory Visit: Payer: BC Managed Care – PPO

## 2022-11-28 NOTE — Telephone Encounter (Addendum)
Pt called and informed to schedule an appt.  Pt stated she took something she bought from the pharmacy in which she stated it helped. I tod pt she can come in for a lab only appt - she's agreed to come in this am.

## 2022-12-08 ENCOUNTER — Telehealth: Payer: Self-pay | Admitting: *Deleted

## 2022-12-08 NOTE — Telephone Encounter (Signed)
RTC to patient told to try the OTC NS Nasal spray.  Patient to try.  Would like to get a call for a follow up appointment in the Clinics.

## 2022-12-08 NOTE — Telephone Encounter (Signed)
Call from patient stating that the OTC Flonase she was instructed to get is not working.  Has been using and when she blows her nose it is blood tinged.  Sometimes is yellow or green mucous. No fever.  Is unable to take OTC meds due to blood pressure and heart condition.  Patient also asked for the Cardiologist number as she did not go for the appointment that was scheduled.  Will call to reschedule.

## 2023-01-17 LAB — HM DIABETES EYE EXAM

## 2023-01-22 NOTE — Progress Notes (Signed)
 Cardiology Clinic Note   Patient Name: Kim Werner Date of Encounter: 01/22/2023  Primary Care Provider:  Elicia Sharper, DO Primary Cardiologist:  Alm Clay, MD  Patient Profile    Kim Werner 48 year old female presents to the clinic today for follow-up evaluation of her hypertension.  Past Medical History    Past Medical History:  Diagnosis Date   Candida vaginitis 12/03/2021   Diabetes mellitus without complication (HCC)    Hyperlipidemia associated with type 2 diabetes mellitus (HCC)    Hypertensive heart disease with chronic diastolic congestive heart failure (HCC) 03/2021   Severe LVH with EF 50-55%: Has been admitted twice with profound hypotension, most recentlyPulm edema in July 2023.   Morbid obesity due to excess calories (HCC)    Resistant hypertension    On high-dose carvedilol , losartan , HCTZ and amlodipine    Past Surgical History:  Procedure Laterality Date   BREAST BIOPSY Right 09/02/2022   US  RT BREAST BX W LOC DEV 1ST LESION IMG BX SPEC US  GUIDE 09/02/2022 [facility]   BREAST BIOPSY Right 09/02/2022   US  RT BREAST BX W LOC DEV EA ADD LESION IMG BX SPEC US  GUIDE 09/02/2022 [facility]   BREAST BIOPSY Left 09/02/2022   US  LT BREAST BX W LOC DEV 1ST LESION IMG BX SPEC US  GUIDE 09/02/2022 [facility]   TRANSTHORACIC ECHOCARDIOGRAM  03/2021   EF 50%.  Low normal to mildly reduced function.  Anterolateral wall and apical lateral segment are hypokinetic.  Severe asymmetric LVH with no LVOT gradient..  Indeterminate TAVR elevated LAP.  Unable to assess PAP but RV is normal.  Normal aortic and mitral valves. => Consider CMR to assess and evaluate hypertrophic cardiomyopathy.   TRANSTHORACIC ECHOCARDIOGRAM  08/05/2021   EF 50 to 55%.  No RWMA.  Severe concentric LVH.  EDA fusion.-Unable to assess diastolic function -> left atrial size was normal.  Normal RV.  Normal valves.  Normal RAP.    Allergies  No Known Allergies  History  of Present Illness    Kim Werner is a PMH of hypertensive urgency, acute on chronic diastolic CHF, type 2 diabetes, chest discomfort, hyperlipidemia, class III obesity, and mild chronic anemia.  She presented to the hospital on 08/05/2021 and was discharged on 08/06/2021.  She presented with increased dyspnea and hypertension.  She was treated for flash pulmonary edema secondary to her severe hypertension.  Her admission was complicated by hyperglycemia.  Her amlodipine , carvedilol , losartan , hydrochlorothiazide  were continued.  It was recommended that clonidine and furosemide  also be considered in the outpatient setting.  She was instructed to maintain a blood pressure log and follow-up with cardiology as an outpatient.  Her A1c was noted to be 12.8.  She was continued on insulin  and Ozempic .  Her chest x-ray showed vascular congestion versus minimal pulmonary edema.  She received furosemide  and was started on BiPAP.  Her condition improved overnight and the next day she was asymptomatic.  Her blood pressure had returned to normal.  Her nitro gtt. was discontinued.  She was transitioned to p.o. amlodipine , carvedilol , and hydrochlorothiazide .  Her admission glucose was 400.  Her blood sugar was resistant to multiple doses of insulin .  She reported not having success with her home insulin  regimen.  She was discharged with new orders for basal insulin  plus short acting insulin  for her mealtime coverage and GLP-1 agonist.  On admission her creatinine was elevated at 1.18 from her baseline of 0.8.  It was felt that  the severe hypertension was contributing to elevated creatinine levels.  She was discharged in good condition.  She presented to the clinic 08/13/21 for follow-up evaluation stated she felt well .  She had noted some slight lightheadedness with doing laundry and bending down to pick up close.  Her blood pressure was well controlled.  We reviewed her hospitalization and she expressed understanding.   She was working on eliminating carbohydrates and being more physically active.  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  I  gave her the salty 6 diet sheet, gave her a weight log, asked her to increase her physical activity as tolerated and planned follow-up in 3 to 4 months.  She was seen in follow-up by Dr. Anner on 12/13/2021.  She reported that she felt great.  She was feeling healthy.  She was losing weight after adjusting her diet.  Her blood pressure was much better controlled.  She denied issues with chest discomfort, pressure, and dyspnea at rest.  She denied PND orthopnea and lower extremity swelling.  She had returned to work and was doing well.  She was noted to have some mild lower extremity edema.  She had not filled her Lasix  prescription.  She had just started an increased dose of Ozempic  and was hoping for continued weight loss.  She presents to the clinic today for follow-up evaluation and states she has been doing fairly well.  Her main complaint today is with sinus congestion and drainage.  She reports that her sinus drainage and congestion has been present for 2 to 3 months.  She describes her mucus as green.  She is blowing her nose several times per day.  She has been to see  internal medicine who recommended saline rinses.  Her blood pressure today is well-controlled.  She denies anginal symptoms.  She did note a episode of chest discomfort that was seconds yesterday evening and dissipated without intervention.  I will refill her atorvastatin .  I will prescribe amoxicillin  500 mg twice daily x 7 days and plan follow-up in 12 months.  Today she denies chest pain, shortness of breath, lower extremity edema, fatigue, palpitations, melena, hematuria, hemoptysis, diaphoresis, weakness, presyncope, syncope, orthopnea, and PND.    Home Medications    Prior to Admission medications   Medication Sig Start Date End Date Taking? Authorizing Provider  Accu-Chek  Softclix Lancets lancets Use as directed up to four times daily 08/06/21   Lou Claretta HERO, MD  Acetaminophen  (TYLENOL  PO) Take 2 tablets by mouth as needed (period cramps).    [provider]  amLODipine  (NORVASC ) 10 MG tablet Take 1 tablet (10 mg total) by mouth daily. 08/07/21   Lou Claretta HERO, MD  atorvastatin  (LIPITOR) 40 MG tablet Take 1 tablet (40 mg total) by mouth daily. 08/06/21   Amponsah, Prosper M, MD  Blood Glucose Monitoring Suppl (ACCU-CHEK GUIDE ME) w/Device KIT Use as directed up to four times daily. 08/06/21   Amponsah, Prosper M, MD  blood glucose meter kit and supplies Dispense based on patient and insurance preference. Use up to four times daily as directed. (FOR ICD-10 E10.9, E11.9). 08/06/21   Lou Claretta HERO, MD  Calcium  Carbonate Antacid (TUMS PO) Take 2 tablets by mouth as needed (heartburn).    [provider]  carvedilol  (COREG ) 12.5 MG tablet Take 1 tablet (12.5 mg total) by mouth 2 (two) times daily with a meal. 08/06/21   Amponsah, Claretta HERO, MD  glucose blood test strip  Use as directed up to four times daily 08/06/21   Lou Claretta HERO, MD  insulin  aspart (NOVOLOG ) 100 UNIT/ML FlexPen Inject 5 Units into the skin 3 (three) times daily with meals. 08/06/21   Lou Claretta HERO, MD  Insulin  Glargine (BASAGLAR  KWIKPEN) 100 UNIT/ML Inject 30 Units into the skin at bedtime. 08/06/21   Amponsah, Prosper M, MD  Insulin  Pen Needle 32G X 4 MM MISC Use as directed up to four times daily 08/06/21   Lou Claretta HERO, MD  losartan -hydrochlorothiazide  (HYZAAR ) 50-12.5 MG tablet Take 1 tablet by mouth daily. 08/06/21 08/06/22  Lou Claretta HERO, MD  Semaglutide ,0.25 or 0.5MG /DOS, 2 MG/1.5ML SOPN Inject 0.25 mg into the skin once a week. 08/06/21   Lou Claretta HERO, MD    Family History    No family history on file. has no family status information on file.   Social History    Social History   Socioeconomic History   Marital status: Married     Spouse name: Not on file   Number of children: Not on file   Years of education: Not on file   Highest education level: Not on file  Occupational History   Not on file  Tobacco Use   Smoking status: Never   Smokeless tobacco: Never  Vaping Use   Vaping status: Never Used  Substance and Sexual Activity   Alcohol use: Never   Drug use: Never   Sexual activity: Yes  Other Topics Concern   Not on file  Social History Narrative   Recently moved from [place]  to [place] .   Lives with her husband   She teaches eighth grade   Social Drivers of Health   Financial Resource Strain: Medium Risk (02/10/2022)   Overall Financial Resource Strain (CARDIA)    Difficulty of Paying Living Expenses: Somewhat hard  Food Insecurity: Food Insecurity Present (02/10/2022)   Hunger Vital Sign    Worried About Running Out of Food in the Last Year: Often true    Ran Out of Food in the Last Year: Often true  Transportation Needs: No Transportation Needs (02/10/2022)   PRAPARE - Administrator, Civil Service (Medical): No    Lack of Transportation (Non-Medical): No  Physical Activity: Inactive (02/10/2022)   Exercise Vital Sign    Days of Exercise per Week: 0 days    Minutes of Exercise per Session: 0 min  Stress: No Stress Concern Present (02/10/2022)   [employer] of Occupational Health - Occupational Stress Questionnaire    Feeling of Stress : Only a little  Social Connections: Moderately Integrated (02/10/2022)   Social Connection and Isolation Panel [NHANES]    Frequency of Communication with Friends and Family: More than three times a week    Frequency of Social Gatherings with Friends and Family: Patient declined    Attends Religious Services: More than 4 times per year    Active Member of [employer] or Organizations: No    Attends Banker Meetings: Never    Marital Status: Married  Catering Manager Violence: Not At Risk (02/10/2022)   Humiliation, Afraid, Rape,  and Kick questionnaire    Fear of Current or Ex-Partner: No    Emotionally Abused: No    Physically Abused: No    Sexually Abused: No     Review of Systems    General:  No chills, fever, night sweats or weight changes.  Cardiovascular:  No chest pain, dyspnea on exertion, edema, orthopnea, palpitations, paroxysmal nocturnal  dyspnea. Dermatological: No rash, lesions/masses Respiratory: No cough, dyspnea Urologic: No hematuria, dysuria Abdominal:   No nausea, vomiting, diarrhea, bright red blood per rectum, melena, or hematemesis Neurologic:  No visual changes, wkns, changes in mental status. All other systems reviewed and are otherwise negative except as noted above.  Physical Exam    VS:  There were no vitals taken for this visit. , BMI There is no height or weight on file to calculate BMI. GEN: Well nourished, well developed, in no acute distress. HEENT: normal. Neck: Supple, no JVD, carotid bruits, or masses. Cardiac: RRR, no murmurs, rubs, or gallops. No clubbing, cyanosis, edema.  Radials/DP/PT 2+ and equal bilaterally.  Respiratory:  Respirations regular and unlabored, clear to auscultation bilaterally.  Sinus congestion.  Greenish nasal drainage. GI: Soft, nontender, nondistended, BS + x 4. MS: no deformity or atrophy. Skin: warm and dry, no rash. Neuro:  Strength and sensation are intact. Psych: Normal affect.  Accessory Clinical Findings    Recent Labs: 07/07/2022: B Natriuretic Peptide 38.2; Hemoglobin 9.6; Platelets 446 07/20/2022: BUN 14; Creatinine, Ser 1.08; Potassium 4.1; Sodium 136   Recent Lipid Panel    Component Value Date/Time   CHOL 216 (H) 07/20/2022 1021   TRIG 51 07/20/2022 1021   HDL 61 07/20/2022 1021   CHOLHDL 3.5 07/20/2022 1021   CHOLHDL 4.4 03/27/2021 0139   VLDL 17 03/27/2021 0139   LDLCALC 146 (H) 07/20/2022 1021    ECG personally reviewed by me today-  EKG Interpretation Date/Time:  Wednesday January 25 2023 15:39:22 EST Ventricular  Rate:  95 PR Interval:  178 QRS Duration:  76 QT Interval:  370 QTC Calculation: 464 R Axis:   12  Text Interpretation: Sinus rhythm with occasional Premature ventricular complexes T wave abnormality, consider lateral ischemia When compared with ECG of 07-Jul-2022 21:26, Premature ventricular complexes are now Present Confirmed by Emelia Hazy [phone]) on 01/25/2023 4:11:23 PM   Echocardiogram 08/05/2021 IMPRESSIONS     1. Left ventricular ejection fraction, by estimation, is 50 to 55%. The  left ventricle has low normal function. The left ventricle has no regional  wall motion abnormalities. There is severe concentric left ventricular  hypertrophy. Indeterminate  diastolic filling due to E-A fusion.   2. Right ventricular systolic function is normal. The right ventricular  size is normal.   3. The mitral valve is normal in structure. Trivial mitral valve  regurgitation. No evidence of mitral stenosis.   4. The aortic valve is tricuspid. Aortic valve regurgitation is trivial.  No aortic stenosis is present.   5. Aortic dilatation noted. There is mild dilatation of the ascending  aorta, measuring 37 mm.   6. The inferior vena cava is normal in size with greater than 50%  respiratory variability, suggesting right atrial pressure of 3 mmHg.  Assessment & Plan   1.  Diastolic CHF-generalized bilateral lower extremity ankle edema, nonpitting.  Continues to be fairly physically active.  Weight today 277 pounds.  NYHA class I-2.   Continue olmesartan , hydrochlorothiazide , amlodipine  Heart healthy low-sodium diet-salty 6 reviewed  Essential hypertension-BP today 120/80  Maintain blood pressure log Continue amlodipine , carvedilol , hydrochlorothiazide , losartan  Heart healthy low-sodium diet  Type 2 diabetes-A1c 11.3 on 10/05/2022.   Continue current medical therapy. Heart healthy low-sodium carb modified diet Follows with PCP  Obesity-weight today 277 lbs.  . Continue weight loss,  increase physical activity as tolerated Continue heart healthy low-sodium carb modified diet Continue current medical therapy  Sinusitis-reports sinus congestion.  Has been present  for greater than 2 months.  Notes green mucus for quite some time. May use saline flush, Nettie pot Prescribe amoxicillin  500 mg twice daily x 7 days Follow-up with PCP if symptoms do not improve within 1 week.  Disposition: Follow-up with Dr. Anner or me in 12 months.   Josefa HERO. Deaire Mcwhirter NP-C     01/22/2023, 1:24 PM [facility] [address] Office [phone] Fax [phone]  Notice: This dictation was prepared with Dragon dictation along with smaller phrase technology. Any transcriptional errors that result from this process are unintentional and may not be corrected upon review.  I spent 14 minutes examining this patient, reviewing medications, and using patient centered shared decision making involving her cardiac care.  Prior to her visit I spent greater than 20 minutes reviewing her past medical history,  medications, and prior cardiac tests.

## 2023-01-25 ENCOUNTER — Ambulatory Visit: Payer: 59 | Attending: General Practice | Admitting: General Practice

## 2023-01-25 ENCOUNTER — Encounter: Payer: Self-pay | Admitting: General Practice

## 2023-01-25 VITALS — BP 120/80 | HR 95 | Ht 67.0 in | Wt 277.0 lb

## 2023-01-25 DIAGNOSIS — J329 Chronic sinusitis, unspecified: Secondary | ICD-10-CM

## 2023-01-25 DIAGNOSIS — E1169 Type 2 diabetes mellitus with other specified complication: Secondary | ICD-10-CM

## 2023-01-25 DIAGNOSIS — I10 Essential (primary) hypertension: Secondary | ICD-10-CM

## 2023-01-25 DIAGNOSIS — E119 Type 2 diabetes mellitus without complications: Secondary | ICD-10-CM | POA: Diagnosis not present

## 2023-01-25 DIAGNOSIS — I5032 Chronic diastolic (congestive) heart failure: Secondary | ICD-10-CM

## 2023-01-25 DIAGNOSIS — I110 Hypertensive heart disease with heart failure: Secondary | ICD-10-CM

## 2023-01-25 DIAGNOSIS — E785 Hyperlipidemia, unspecified: Secondary | ICD-10-CM

## 2023-01-25 MED ORDER — ATORVASTATIN CALCIUM 40 MG PO TABS: 40.0000 mg | ORAL_TABLET | Freq: Every day | ORAL | 3 refills | Status: DC

## 2023-01-25 MED ORDER — AMOXICILLIN 500 MG PO CAPS
500.0000 mg | ORAL_CAPSULE | Freq: Two times a day (BID) | ORAL | 0 refills | Status: DC
Start: 2023-01-25 — End: 2023-04-03

## 2023-01-25 NOTE — Patient Instructions (Signed)
 Medication Instructions:  TAKE AMOXICILLIN  500MG  TWICE DAILY FOR 7 DAYS *If you need a refill on your cardiac medications before your next appointment, please call your pharmacy*  Lab Work: NONE  Other Instructions INCREASE PHYSICAL ACTIVITY AS TOLERATED PLEASE READ AND FOLLOW ATTACHED  SALTY 6   Follow-Up: At [facility], you and your health needs are our priority.  As part of our continuing mission to provide you with exceptional heart care, we have created designated Provider Care Teams.  These Care Teams include your primary Cardiologist (physician) and Advanced Practice Providers (APPs -  Physician Assistants and Nurse Practitioners) who all work together to provide you with the care you need, when you need it.  Your next appointment:   12 month(s)  Provider:   Alm Clay, MD

## 2023-02-23 LAB — HM DIABETES EYE EXAM

## 2023-03-08 ENCOUNTER — Encounter: Payer: Self-pay | Admitting: Dietician

## 2023-03-29 ENCOUNTER — Telehealth: Payer: Self-pay | Admitting: *Deleted

## 2023-03-29 ENCOUNTER — Other Ambulatory Visit: Payer: Self-pay

## 2023-03-29 MED ORDER — MOUNJARO 5 MG/0.5ML ~~LOC~~ SOAJ: 2.5000 mg | SUBCUTANEOUS | 20 refills | Status: DC

## 2023-03-29 NOTE — Telephone Encounter (Signed)
 Medication sent to pharmacy

## 2023-03-29 NOTE — Telephone Encounter (Signed)
 Copied from CRM [id number]. Topic: Clinical - Medication Question >> Mar 29, 2023 10:37 AM Hector Shade B wrote: Reason for CRM: Payton Mccallum' from [facility] pharmacy needs someone to call her back with clarification on the dosage of the [facility] for the patient. Please call [phone]

## 2023-03-29 NOTE — Telephone Encounter (Signed)
 Will forward message about dosing of the Mounjaro to patient's PCP to call [phone] for medication dosing clarification.

## 2023-04-03 ENCOUNTER — Ambulatory Visit: Admitting: Internal Medicine

## 2023-04-03 VITALS — BP 146/83 | HR 92 | Temp 98.1°F | Ht 67.0 in | Wt 257.0 lb

## 2023-04-03 DIAGNOSIS — R0789 Other chest pain: Secondary | ICD-10-CM

## 2023-04-03 DIAGNOSIS — E1169 Type 2 diabetes mellitus with other specified complication: Secondary | ICD-10-CM

## 2023-04-03 DIAGNOSIS — E119 Type 2 diabetes mellitus without complications: Secondary | ICD-10-CM

## 2023-04-03 DIAGNOSIS — Z794 Long term (current) use of insulin: Secondary | ICD-10-CM

## 2023-04-03 DIAGNOSIS — Z7985 Long-term (current) use of injectable non-insulin antidiabetic drugs: Secondary | ICD-10-CM

## 2023-04-03 DIAGNOSIS — I1A0 Resistant hypertension: Secondary | ICD-10-CM | POA: Diagnosis not present

## 2023-04-03 DIAGNOSIS — E785 Hyperlipidemia, unspecified: Secondary | ICD-10-CM

## 2023-04-03 LAB — POCT GLYCOSYLATED HEMOGLOBIN (HGB A1C): Hemoglobin A1C: 9 % — AB (ref 4.0–5.6)

## 2023-04-03 LAB — GLUCOSE, CAPILLARY: Glucose-Capillary: 198 mg/dL — ABNORMAL HIGH (ref 70–99)

## 2023-04-03 MED ORDER — MOUNJARO 7.5 MG/0.5ML ~~LOC~~ SOAJ: 7.5000 mg | SUBCUTANEOUS | 3 refills | Status: DC

## 2023-04-03 MED ORDER — DEXCOM G7 SENSOR MISC
11 refills | Status: DC
Start: 2023-04-03 — End: 2023-04-27

## 2023-04-03 NOTE — Assessment & Plan Note (Signed)
 She had a shooting pain in the left side of her chest that was under her left breast that started around 1 last night.  Pain lasted between 30 seconds to a minute.  Pain was worsened with deep inspiration.  She had just had intercourse with her husband when pain started.  It went away on its own.  This is the first time this is ever happened. A: She does have several risk factors for atherosclerotic disease including diabetes.  Last echo showed severe concentric left ventricular hypertrophy. P: I encouraged her to monitor her symptoms closely.  If she begins to have additional episodes she should let us know.  If additional episodes occur would plan to do complete stress testing.

## 2023-04-03 NOTE — Patient Instructions (Addendum)
 Thank you, Ms.Declynn GENTRY SEEBER for allowing [place] to provide your care today.   Chest discomfort If that happens again, please try checking your blood pressure at that time. If you have shortness of breath, nausea, vomiting or symptoms do not improve after a few minutes then please come to the emergency room for evaluation.  Diabetes Your A1c was better today at 9 AND you've lost 20 lbs! Congratulations!!   Please pick up Dexcom and download application. This is an amazing tool that will help [place] see your blood sugars throughout the day. This will help with adjusting your insulin to be specific to you. Continue taking lantus 25 mg at night and novolog 5 units with meals. Increase mounjaro to 7.5 mg weekly.  Follow-up in 4 weeks.  Blood pressure Continue your current medications. I do think getting a sleep study done is a good idea as sleep apnea can make blood pressure higher.  I have ordered the following labs for you:  Lab Orders         Lipid Profile         Glucose, capillary         POC Hbg A1C      I have ordered the following medication/changed the following medications:   Stop the following medications: Medications Discontinued During This Encounter  Medication Reason   amoxicillin (AMOXIL) 500 MG capsule    Continuous Blood Gluc Sensor (DEXCOM G7 SENSOR) MISC Reorder   tirzepatide [facility]) 5 MG/0.5ML Pen      Start the following medications: Meds ordered this encounter  Medications   Continuous Glucose Sensor (DEXCOM G7 SENSOR) MISC    Sig: Please use as directed to check your blood sugar.    Dispense:  3 each    Refill:  11   tirzepatide (MOUNJARO) 7.5 MG/0.5ML Pen    Sig: Inject 7.5 mg into the skin once a week.    Dispense:  2 mL    Refill:  3     Follow up:  1 month for review of dexcom log    We look forward to seeing you next time. Please call our clinic at [phone] if you have any questions or concerns. The best time to call is Monday-Friday from  9am-4pm, but there is someone available 24/7. If after hours or the weekend, call the main hospital number and ask for the Internal Medicine Resident On-Call. If you need medication refills, please notify your pharmacy one week in advance and they will send [place] a request.   Thank you for trusting me with your care. Wishing you the best!   Rudene Christians, DO [facility]

## 2023-04-03 NOTE — Assessment & Plan Note (Addendum)
 A1c improved from 11-9 today.  She has lost about 20 pounds with Mounjaro.  She reports taking Mounjaro 5 mg weekly and is tolerating this medication well.  Her medications also include Lantus 25 units and NovoLog 5 units 3 times daily.  She has not tolerated SGLT2's in the past with history of yeast infections. Is checking her blood sugars with a glucometer but did not bring her meter today.  She is unsure of average blood sugars.  She was prescribed a Dexcom at last visit but she has not picked up this yet. P: I talked with her about CGM and benefits of this.  She plans to pick up a Dexcom and was downloading app in office visit. Increase Mounjaro from 5 mg to 7.5 mg weekly Continue Lantus at 25 mg and NovoLog 5 units 3 times daily.  With CGM data can titrate insulin more precisely Follow-up in 4 weeks to review CGM data and titrate insulin Lipid panel today.  She reports adherence with atorvastatin

## 2023-04-03 NOTE — Progress Notes (Signed)
 Subjective:  CC: diabetes  HPI:  [name] is a 48 y.o. female with a past medical history of diabetes, resistant HTN, HLD who presents today for diabetes, HTN and HLD.  Last office visit was in October. Her insulin dose were continued and she was started on mounjaro at that time.  Please see problem based assessment and plan for additional details.  Past Medical History:  Diagnosis Date   Candida vaginitis 12/03/2021   Diabetes mellitus without complication (HCC)    Hyperlipidemia associated with type 2 diabetes mellitus (HCC)    Hypertensive heart disease with chronic diastolic congestive heart failure (HCC) 03/2021   Severe LVH with EF 50-55%: Has been admitted twice with profound hypotension, most recentlyPulm edema in July 2023.   Morbid obesity due to excess calories (HCC)    Resistant hypertension    On high-dose carvedilol, losartan, HCTZ and amlodipine    MEDICATIONS:  Olmesartan-amlodipine-hydrochlorothiazide 40-10-25 Tirzepatide 5 mg weekly  Lantus 25 mg at bedtime Ferrous sulfate Atorvastatin 40 mg Novolog 5 tid   No family history on file.  Past Surgical History:  Procedure Laterality Date   BREAST BIOPSY Right 09/02/2022   [place] RT BREAST BX W LOC DEV 1ST LESION IMG BX SPEC US GUIDE 09/02/2022 [facility]   BREAST BIOPSY Right 09/02/2022   [place] RT BREAST BX W LOC DEV EA ADD LESION IMG BX SPEC US GUIDE 09/02/2022 [facility]   BREAST BIOPSY Left 09/02/2022   [place] LT BREAST BX W LOC DEV 1ST LESION IMG BX SPEC US GUIDE 09/02/2022 [facility]   TRANSTHORACIC ECHOCARDIOGRAM  03/2021   EF 50%.  Low normal to mildly reduced function.  Anterolateral wall and apical lateral segment are hypokinetic.  Severe asymmetric LVH with no LVOT gradient..  Indeterminate TAVR elevated LAP.  Unable to assess PAP but RV is normal.  Normal aortic and mitral valves. => Consider CMR to assess and evaluate hypertrophic cardiomyopathy.   TRANSTHORACIC  ECHOCARDIOGRAM  08/05/2021   EF 50 to 55%.  No RWMA.  Severe concentric LVH.  EDA fusion.-Unable to assess diastolic function -> left atrial size was normal.  Normal RV.  Normal valves.  Normal RAP.     Social History   Socioeconomic History   Marital status: Married    Spouse name: Not on file   Number of children: Not on file   Years of education: Not on file   Highest education level: Not on file  Occupational History   Not on file  Tobacco Use   Smoking status: Never   Smokeless tobacco: Never  Vaping Use   Vaping status: Never Used  Substance and Sexual Activity   Alcohol use: Never   Drug use: Never   Sexual activity: Yes  Other Topics Concern   Not on file  Social History Narrative   Recently moved from [place] to [place].   Lives with her husband   She teaches eighth grade   Social Drivers of Health   Financial Resource Strain: Medium Risk (02/10/2022)   Overall Financial Resource Strain (CARDIA)    Difficulty of Paying Living Expenses: Somewhat hard  Food Insecurity: Food Insecurity Present (02/10/2022)   Hunger Vital Sign    Worried About Running Out of Food in the Last Year: Often true    Ran Out of Food in the Last Year: Often true  Transportation Needs: No Transportation Needs (02/10/2022)   PRAPARE - Administrator, Civil Service (Medical): No  Lack of Transportation (Non-Medical): No  Physical Activity: Inactive (02/10/2022)   Exercise Vital Sign    Days of Exercise per Week: 0 days    Minutes of Exercise per Session: 0 min  Stress: No Stress Concern Present (02/10/2022)   [employer] of Occupational Health - Occupational Stress Questionnaire    Feeling of Stress : Only a little  Social Connections: Moderately Integrated (02/10/2022)   Social Connection and Isolation Panel [NHANES]    Frequency of Communication with Friends and Family: More than three times a week    Frequency of Social Gatherings with Friends and Family:  Patient declined    Attends Religious Services: More than 4 times per year    Active Member of [employer] or Organizations: No    Attends Banker Meetings: Never    Marital Status: Married  Catering manager Violence: Not At Risk (02/10/2022)   Humiliation, Afraid, Rape, and Kick questionnaire    Fear of Current or Ex-Partner: No    Emotionally Abused: No    Physically Abused: No    Sexually Abused: No    Review of Systems: ROS negative except for what is noted on the assessment and plan.  Objective:   Vitals:   04/03/23 1555 04/03/23 1637  BP: (!) 141/98 (!) 146/83  Pulse: 95 92  Temp: 98.1 F (36.7 C)   TempSrc: Oral   SpO2: 97%   Weight: 257 lb (116.6 kg)   Height: 5\' 7"  (1.702 m)     Physical Exam: Constitutional: well-appearing, in no acute distress Cardiovascular: regular rate and rhythm, no m/r/g Pulmonary/Chest: normal work of breathing on room air, lungs clear to auscultation bilaterally Abdominal: soft, non-tender, non-distended MSK: normal bulk and tone Neurological: normal gait Skin: warm and dry   Assessment & Plan:  Type 2 diabetes mellitus with other specified complication (HCC) A1c improved from 11-9 today.  She has lost about 20 pounds with Mounjaro.  She reports taking Mounjaro 5 mg weekly and is tolerating this medication well.  Her medications also include Lantus 25 units and NovoLog 5 units 3 times daily.  She has not tolerated SGLT2's in the past with history of yeast infections. Is checking her blood sugars with a glucometer but did not bring her meter today.  She is unsure of average blood sugars.  She was prescribed a Dexcom at last visit but she has not picked up this yet. P: I talked with her about CGM and benefits of this.  She plans to pick up a Dexcom and was downloading app in office visit. Increase Mounjaro from 5 mg to 7.5 mg weekly Continue Lantus at 25 mg and NovoLog 5 units 3 times daily.  With CGM data can titrate insulin more  precisely Follow-up in 4 weeks to review CGM data and titrate insulin Lipid panel today.  She reports adherence with atorvastatin  Resistant hypertension Blood pressure mildly elevated today at 146/83.  She is on a combination pill with Olmesartan-amlodipine-hydrochlorothiazide 40-10-25 she took this morning.  She has lost about 20 pounds with Mounjaro.  She was referred to a sleep test to last summer but she did not go as she stopped snoring. P: Continue combination pill at same dose.  Prior office visit her blood pressure was well-controlled on this medication I recommended that she still complete sleep study and talked with her about OSA increasing blood pressure.  She was open to doing a sleep study  Chest discomfort She had a shooting pain in the left  side of her chest that was under her left breast that started around 1 last night.  Pain lasted between 30 seconds to a minute.  Pain was worsened with deep inspiration.  She had just had intercourse with her husband when pain started.  It went away on its own.  This is the first time this is ever happened. A: She does have several risk factors for atherosclerotic disease including diabetes.  Last echo showed severe concentric left ventricular hypertrophy. P: I encouraged her to monitor her symptoms closely.  If she begins to have additional episodes she should let us know.  If additional episodes occur would plan to do complete stress testing.  Patient discussed with Dr. Wille Celeste Lorain Fettes, D.O. [facility]  PGY-3 Pager: [phone]  Phone: [phone] Date 04/03/2023  Time 5:30 PM

## 2023-04-03 NOTE — Assessment & Plan Note (Signed)
 Blood pressure mildly elevated today at 146/83.  She is on a combination pill with Olmesartan-amlodipine-hydrochlorothiazide 40-10-25 she took this morning.  She has lost about 20 pounds with Mounjaro.  She was referred to a sleep test to last summer but she did not go as she stopped snoring. P: Continue combination pill at same dose.  Prior office visit her blood pressure was well-controlled on this medication I recommended that she still complete sleep study and talked with her about OSA increasing blood pressure.  She was open to doing a sleep study

## 2023-04-04 ENCOUNTER — Telehealth: Payer: Self-pay

## 2023-04-04 LAB — LIPID PANEL
Chol/HDL Ratio: 3.5 ratio (ref 0.0–4.4)
Cholesterol, Total: 209 mg/dL — ABNORMAL HIGH (ref 100–199)
HDL: 59 mg/dL (ref >39)
LDL Chol Calc (NIH): 131 mg/dL — ABNORMAL HIGH (ref 0–99)
Triglycerides: 107 mg/dL (ref 0–149)
VLDL Cholesterol Cal: 19 mg/dL (ref 5–40)

## 2023-04-04 NOTE — Telephone Encounter (Signed)
 Kim Werner ([employer]) PA Case ID #: [id number] Rx #: [id number] Need Help? Call us at [phone] Outcome Approved today by [facility] NCPDP 2017 Your PA request has been approved. Additional information will be provided in the approval communication. (Message 1145) Effective Date: 04/04/2023 Authorization Expiration Date: 04/03/2024 Drug Dexcom G7 Sensor ePA cloud logo Form [employer] Electronic PA Form [phone] NCPDP) Original Claim Info 31

## 2023-04-04 NOTE — Telephone Encounter (Signed)
 Prior Authorization for patient (Dexcom G7 Sensor) came through on cover my meds was submitted with last office notes and labs awaiting approval or denial.  QIO:[id number]

## 2023-04-04 NOTE — Addendum Note (Signed)
 Addended by: Lucille Passy on: 04/04/2023 04:53 PM   Modules accepted: Orders

## 2023-04-10 NOTE — Progress Notes (Signed)
 Internal Medicine Clinic Attending  Case discussed with the resident at the time of the visit.  We reviewed the resident's history and exam and pertinent patient test results.  I agree with the assessment, diagnosis, and plan of care documented in the resident's note.

## 2023-04-27 ENCOUNTER — Encounter: Admitting: Student

## 2023-04-27 ENCOUNTER — Ambulatory Visit: Admitting: Student

## 2023-04-27 ENCOUNTER — Other Ambulatory Visit: Payer: Self-pay

## 2023-04-27 VITALS — BP 135/86 | HR 96 | Temp 98.4°F | Resp 28 | Ht 67.0 in | Wt 275.9 lb

## 2023-04-27 DIAGNOSIS — I1A0 Resistant hypertension: Secondary | ICD-10-CM

## 2023-04-27 DIAGNOSIS — E1169 Type 2 diabetes mellitus with other specified complication: Secondary | ICD-10-CM

## 2023-04-27 DIAGNOSIS — Z23 Encounter for immunization: Secondary | ICD-10-CM | POA: Diagnosis not present

## 2023-04-27 DIAGNOSIS — Z7985 Long-term (current) use of injectable non-insulin antidiabetic drugs: Secondary | ICD-10-CM

## 2023-04-27 DIAGNOSIS — E785 Hyperlipidemia, unspecified: Secondary | ICD-10-CM

## 2023-04-27 DIAGNOSIS — E119 Type 2 diabetes mellitus without complications: Secondary | ICD-10-CM

## 2023-04-27 DIAGNOSIS — Z794 Long term (current) use of insulin: Secondary | ICD-10-CM

## 2023-04-27 MED ORDER — ATORVASTATIN CALCIUM 40 MG PO TABS: 40.0000 mg | ORAL_TABLET | Freq: Every day | ORAL | 3 refills | Status: DC

## 2023-04-27 MED ORDER — DEXCOM G7 SENSOR MISC
11 refills | Status: DC
Start: 2023-04-27 — End: 2023-04-28

## 2023-04-27 MED ORDER — ACCU-CHEK SOFTCLIX LANCETS MISC
0 refills | Status: DC
Start: 2023-04-27 — End: 2023-04-28

## 2023-04-27 MED ORDER — OLMESARTAN-AMLODIPINE-HCTZ 40-10-25 MG PO TABS: 1.0000 | ORAL_TABLET | Freq: Every day | ORAL | 3 refills | Status: DC

## 2023-04-27 NOTE — Progress Notes (Unsigned)
 Established Patient Office Visit  Subjective   Patient ID: Kim Werner, female    DOB: 1975-05-30  Age: 48 y.o. MRN: [id number]  Chief Complaint  Patient presents with   Follow-up    Kim Werner is a 48 y.o. who presents to the clinic for a 4 week follow up of T2DM. Please see problem based assessment and plan for additional details.   Patient Active Problem List   Diagnosis Date Noted   Chest discomfort 04/03/2023   At risk for sleep apnea 08/10/2022   Breast pain 02/10/2022   Iron deficiency anemia 12/02/2021   Health care maintenance 12/02/2021   Resistant hypertension 08/13/2021   Hyperlipidemia associated with type 2 diabetes mellitus (HCC) 08/05/2021   Obesity, Class III, BMI 40-49.9 (morbid obesity) (HCC) 08/05/2021   Type 2 diabetes mellitus with other specified complication (HCC) 03/26/2021   Hypertensive heart disease with chronic diastolic congestive heart failure (HCC) 03/2021      Objective:     BP 135/86 (BP Location: Right Arm, Patient Position: Sitting, Cuff Size: Large)   Pulse 96   Temp 98.4 F (36.9 C) (Oral) Comment: room air  Resp (!) 28   Ht 5\' 7"  (1.702 m)   Wt 275 lb 14.4 oz (125.1 kg)   SpO2 100% Comment: Room air  BMI 43.21 kg/m  BP Readings from Last 3 Encounters:  04/27/23 135/86  04/03/23 (!) 146/83  01/25/23 120/80   Wt Readings from Last 3 Encounters:  04/27/23 275 lb 14.4 oz (125.1 kg)  04/03/23 257 lb (116.6 kg)  01/25/23 277 lb (125.6 kg)      Physical Exam Vitals reviewed.  Constitutional:      General: She is not in acute distress.    Appearance: She is obese. She is not ill-appearing, toxic-appearing or diaphoretic.  Cardiovascular:     Rate and Rhythm: Normal rate and regular rhythm.     Heart sounds: Normal heart sounds. No murmur heard. Pulmonary:     Effort: Pulmonary effort is normal.     Breath sounds: Normal breath sounds.  Abdominal:     General: Bowel sounds are normal.     Palpations:  Abdomen is soft.     Tenderness: There is no abdominal tenderness. There is no guarding.  Skin:    General: Skin is warm and dry.  Neurological:     Mental Status: She is alert.  Psychiatric:        Mood and Affect: Mood and affect normal.      No results found for any visits on 04/27/23.  Last metabolic panel Lab Results  Component Value Date   GLUCOSE 188 (H) 07/20/2022   NA 136 07/20/2022   K 4.1 07/20/2022   CL 97 07/20/2022   CO2 20 07/20/2022   BUN 14 07/20/2022   CREATININE 1.08 (H) 07/20/2022   EGFR 65 07/20/2022   CALCIUM 9.7 07/20/2022   PHOS 4.0 03/27/2021   PROT 7.6 08/05/2021   ALBUMIN 3.5 08/05/2021   BILITOT 0.6 08/05/2021   ALKPHOS 87 08/05/2021   AST 31 08/05/2021   ALT 31 08/05/2021   ANIONGAP 12 07/07/2022   Last lipids Lab Results  Component Value Date   CHOL 209 (H) 04/03/2023   HDL 59 04/03/2023   LDLCALC 131 (H) 04/03/2023   TRIG 107 04/03/2023   CHOLHDL 3.5 04/03/2023   Last hemoglobin A1c Lab Results  Component Value Date   HGBA1C 9.0 (A) 04/03/2023      The 10-year  ASCVD risk score (Arnett DK, et al., 2019) is: 3%    Assessment & Plan:   Problem List Items Addressed This Visit       Cardiovascular and Mediastinum   Resistant hypertension (Chronic)   Relevant Medications   atorvastatin (LIPITOR) 40 MG tablet   Olmesartan-amLODIPine-HCTZ 40-10-25 MG TABS     Endocrine   Hyperlipidemia associated with type 2 diabetes mellitus (HCC) (Chronic)   Relevant Medications   atorvastatin (LIPITOR) 40 MG tablet   Olmesartan-amLODIPine-HCTZ 40-10-25 MG TABS   Type 2 diabetes mellitus with other specified complication [facility])   Patient presents with a history of T2DM with a prior A1c of 9.0 in March 2025. At the prior office visit, she was prescribed a CGM but has yet to pick up the Rx from the pharmacy, she also does not have her glucometer with her or a log.  She reported that she was taking Lantus 30 units daily because she "eats  heavy", Aspart 5 units TID with meals , and mounjaro 7.5mg  weekly.  Patient reports hypoglycemia in the morning, with BG readings in the 60s-70s.   Plan: -Continue regimen of: Lantus 25 units daily: patient was reminded to NOT increase her lantus without checking with the clinic, Aspart 5 units TID with meals ,mounjaro 7.5mg  weekly -Patient was instructed to call if she continues to have hypoglycemia in the morning on 25 units of Lantus so we can make adjustments -Urine ACR UTD -Patient instructed to pick up CGM and bring her glucometer/CGM with her to the follow up appointment in one month        Relevant Medications   atorvastatin (LIPITOR) 40 MG tablet   Accu-Chek Softclix Lancets lancets   Continuous Glucose Sensor (DEXCOM G7 SENSOR) MISC   Olmesartan-amLODIPine-HCTZ 40-10-25 MG TABS   Other Visit Diagnoses       Encounter for Prevnar pneumococcal vaccination    -  Primary   Relevant Orders   Pneumococcal conjugate vaccine 20-valent (Prevnar 20)     Encounter for immunization       Relevant Orders   Pneumococcal conjugate vaccine 20-valent (Completed)       Return in about 4 weeks (around 05/25/2023) for T2DM follow up .    Faith Rogue, DO

## 2023-04-27 NOTE — Patient Instructions (Signed)
 Thank you, Kim Werner for allowing [place] to provide your care today. Today we discussed diabetes.  -Please do not take more than 25 units of lantus every night -If you keep having low blood sugars (less than 70) in the morning, give [place] a call so we can help you!       I have ordered the following medication/changed the following medications:   Stop the following medications: Medications Discontinued During This Encounter  Medication Reason   Continuous Glucose Sensor (DEXCOM G7 SENSOR) MISC Reorder   Accu-Chek Softclix Lancets lancets Reorder   Olmesartan-amLODIPine-HCTZ 40-10-25 MG TABS Reorder   atorvastatin (LIPITOR) 40 MG tablet Reorder     Start the following medications: Meds ordered this encounter  Medications   Continuous Glucose Sensor (DEXCOM G7 SENSOR) MISC    Sig: Please use as directed to check your blood sugar.    Dispense:  3 each    Refill:  11   Accu-Chek Softclix Lancets lancets    Sig: Use as directed up to four times daily    Dispense:  100 each    Refill:  0   Olmesartan-amLODIPine-HCTZ 40-10-25 MG TABS    Sig: Take 1 tablet by mouth daily.    Dispense:  90 tablet    Refill:  3   atorvastatin (LIPITOR) 40 MG tablet    Sig: Take 1 tablet (40 mg total) by mouth daily.    Dispense:  90 tablet    Refill:  3     Follow up: 1 month    Remember: To bring your glucometer to the next visit and pick up your continuous glucose meter!   Should you have any questions or concerns please call the internal medicine clinic at [phone].     Please note that our late policy has changed.  If you are more than 15 minutes late to your appointment, you may be asked to reschedule your appointment.  Dr. Hessie Diener, D.O. [facility]

## 2023-04-28 MED ORDER — DEXCOM G7 SENSOR MISC: 11 refills | Status: AC

## 2023-04-28 MED ORDER — ACCU-CHEK SOFTCLIX LANCETS MISC
0 refills | Status: AC
Start: 2023-04-28 — End: ?

## 2023-04-28 MED ORDER — OLMESARTAN-AMLODIPINE-HCTZ 40-10-25 MG PO TABS: 1.0000 | ORAL_TABLET | Freq: Every day | ORAL | 3 refills | Status: DC

## 2023-04-28 NOTE — Assessment & Plan Note (Signed)
 Patient presents with a history of T2DM with a prior A1c of 9.0 in March 2025. At the prior office visit, she was prescribed a CGM but has yet to pick up the Rx from the pharmacy, she also does not have her glucometer with her or a log.  She reported that she was taking Lantus 30 units daily because she "eats heavy", Aspart 5 units TID with meals , and mounjaro 7.5mg  weekly.  Patient reports hypoglycemia in the morning, with BG readings in the 60s-70s.   Plan: -Continue regimen of: Lantus 25 units daily: patient was reminded to NOT increase her lantus without checking with the clinic, Aspart 5 units TID with meals ,mounjaro 7.5mg  weekly -Patient was instructed to call if she continues to have hypoglycemia in the morning on 25 units of Lantus so we can make adjustments -Urine ACR UTD -Patient instructed to pick up CGM and bring her glucometer/CGM with her to the follow up appointment in one month

## 2023-04-30 NOTE — Progress Notes (Signed)
 Internal Medicine Clinic Attending  Case discussed with the resident at the time of the visit.  We reviewed the resident's history and exam and pertinent patient test results.  I agree with the assessment, diagnosis, and plan of care documented in the resident's note.

## 2023-06-05 ENCOUNTER — Encounter: Admitting: Student

## 2023-06-05 NOTE — Progress Notes (Deleted)
 CC: ***  HPI:  [name] is a 48 y.o. female with a past medical history of HFpEF, hypertension, type 2 diabetes who presents for follow-up appointment.  Please see assessment plan for full HPI.  Medications: Hyperlipidemia: Lipitor 40 mg daily Iron deficiency: Ferrous sulfate  325 mg every other day Diabetes: NovoLog  5 units 3 times daily before meals, Lantus  25 units daily, Mounjaro  7.5 mg weekly Hypertension: Olmesartan -amlodipine -HCTZ 1 tablet daily  Patient was seen 1 month ago.  At that time patient was continued on her medications.  A1c at that time was 9.0. LDL at that time was 131.  Goal will be less than 100.  Foot exam Colonoscopy  Will go over glucose measurements. Obtain blood pressures to see if blood pressure is well-controlled. No need for A1c at this time.  Past Medical History:  Diagnosis Date   Candida vaginitis 12/03/2021   Diabetes mellitus without complication (HCC)    Hyperlipidemia associated with type 2 diabetes mellitus (HCC)    Hypertensive heart disease with chronic diastolic congestive heart failure (HCC) 03/2021   Severe LVH with EF 50-55%: Has been admitted twice with profound hypotension, most recentlyPulm edema in July 2023.   Morbid obesity due to excess calories (HCC)    Resistant hypertension    On high-dose carvedilol , losartan , HCTZ and amlodipine      Current Outpatient Medications:    Accu-Chek Softclix Lancets lancets, Use as directed up to four times daily, Disp: 100 each, Rfl: 0   Acetaminophen  (TYLENOL  PO), Take 2 tablets by mouth as needed (period cramps)., Disp: , Rfl:    atorvastatin  (LIPITOR) 40 MG tablet, Take 1 tablet (40 mg total) by mouth daily., Disp: 90 tablet, Rfl: 3   blood glucose meter kit and supplies, Dispense based on patient and insurance preference. Use up to four times daily as directed. (FOR ICD-10 E10.9, E11.9)., Disp: 1 each, Rfl: 0   Blood Glucose Monitoring Suppl (ACCU-CHEK GUIDE ME) w/Device  KIT, Use as directed up to four times daily., Disp: 1 kit, Rfl: 0   Calcium  Carbonate Antacid (TUMS PO), Take 2 tablets by mouth as needed (heartburn)., Disp: , Rfl:    Continuous Blood Gluc Receiver (DEXCOM G7 RECEIVER) DEVI, Please use as directed to check your blood sugar., Disp: 1 each, Rfl: 0   Continuous Glucose Sensor (DEXCOM G7 SENSOR) MISC, Please use as directed to check your blood sugar., Disp: 3 each, Rfl: 11   ferrous sulfate  325 (65 FE) MG tablet, Take 1 tablet (325 mg total) by mouth every other day., Disp: 30 tablet, Rfl: 5   glucose blood test strip, Use as directed up to four times daily, Disp: 100 each, Rfl: 0   insulin  aspart (NOVOLOG ) 100 UNIT/ML FlexPen, Inject 5 Units into the skin 3 (three) times daily before meals., Disp: 15 mL, Rfl: 11   insulin  glargine (LANTUS ) 100 UNIT/ML Solostar Pen, Inject 25 Units into the skin at bedtime., Disp: 15 mL, Rfl: 5   Insulin  Pen Needle 32G X 4 MM MISC, Use as directed up to four times daily, Disp: 200 each, Rfl: 8   Olmesartan -amLODIPine -HCTZ 40-10-25 MG TABS, Take 1 tablet by mouth daily., Disp: 90 tablet, Rfl: 3   tirzepatide  (MOUNJARO ) 7.5 MG/0.5ML Pen, Inject 7.5 mg into the skin once a week., Disp: 2 mL, Rfl: 3  Review of Systems:  ***  Constitutional: Eye: Respiratory: Cardiovascular: GI: MSK: GU: Skin: Neuro: Endocrine:   Physical Exam:  There were no vitals filed for this visit. ***  General: Patient is sitting comfortably in the room  Eyes: Pupils equal and reactive to light, EOM intact  Head: Normocephalic, atraumatic  Neck: Supple, nontender, full range of motion, No JVD Cardio: Regular rate and rhythm, no murmurs, rubs or gallops. 2+ pulses to bilateral upper and lower extremities  Chest: No chest tenderness Pulmonary: Clear to ausculation bilaterally with no rales, rhonchi, and crackles  Abdomen: Soft, nontender with normoactive bowel sounds with no rebound or guarding  Neuro: Alert and orientated x3. CN  II-XII intact. Sensation intact to upper and lower extremities. 2+ patellar reflex.  Back: No midline tenderness, no step off or deformities noted. No paraspinal muscle tenderness.  Skin: No rashes noted  MSK: 5/5 strength to upper and lower extremities.    Assessment & Plan:   No problem-specific Assessment & Plan notes found for this encounter.    Patient {GC/GE:[id number]::"discussed with","seen with"} Dr. {NAMES:[id number]::"Guilloud","Hoffman","Mullen","Narendra","Williams","Giddings"}  Jonelle Neri, DO PGY-2 Internal Medicine Resident

## 2023-07-11 IMAGING — CT CT ANGIO CHEST
2 of 6 series · 19 of 36 positions shown · IV contrast (agent unspecified)
Comparison: None.

CLINICAL DATA: Shortness of breath and chest pain with inspiration.

EXAM:
CT ANGIOGRAPHY CHEST WITH CONTRAST
TECHNIQUE: Multidetector CT imaging of the chest was performed using the
standard protocol during bolus administration of intravenous
contrast. Multiplanar CT image reconstructions and MIPs were
obtained to evaluate the vascular anatomy.

[Series 6: pe thins · axial · 0.88mm/px · z∈[+1118,+1359]mm · 18 of 383 slices shown]
[im 20/383  lung]
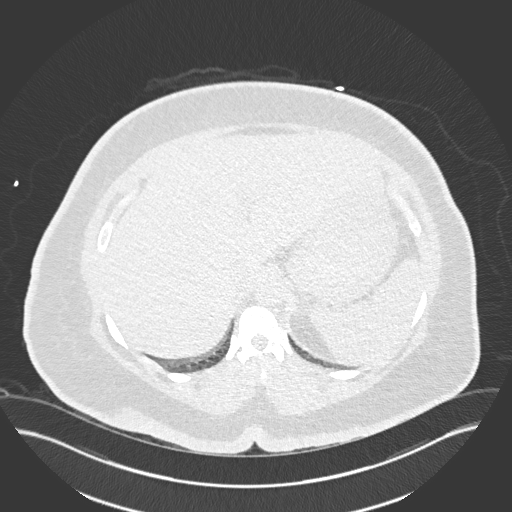
[im 39/383  mediastinal]
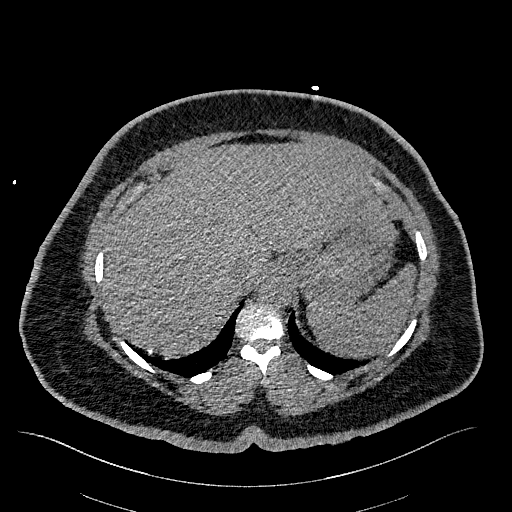
[im 58/383  lung]
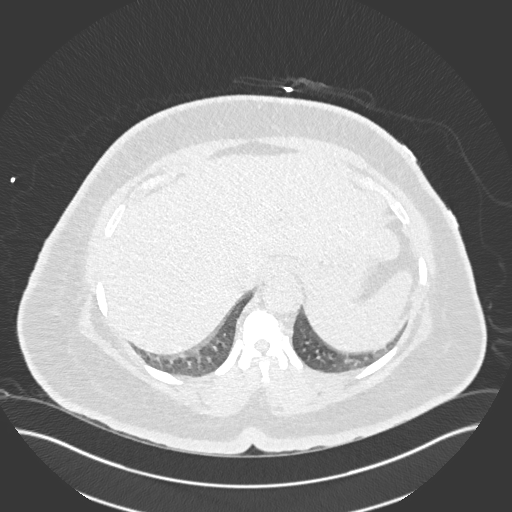
[im 77/383  mediastinal]
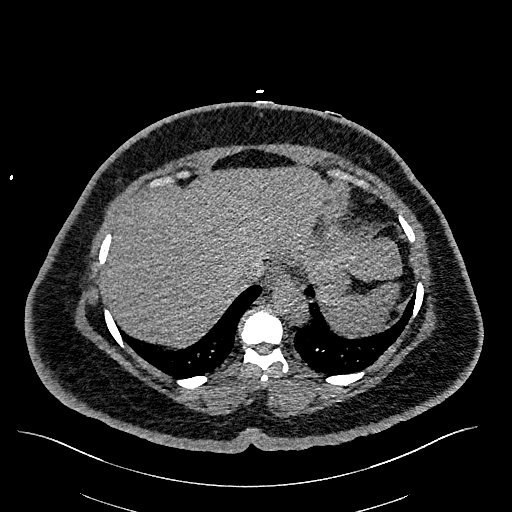
[im 96/383  lung]
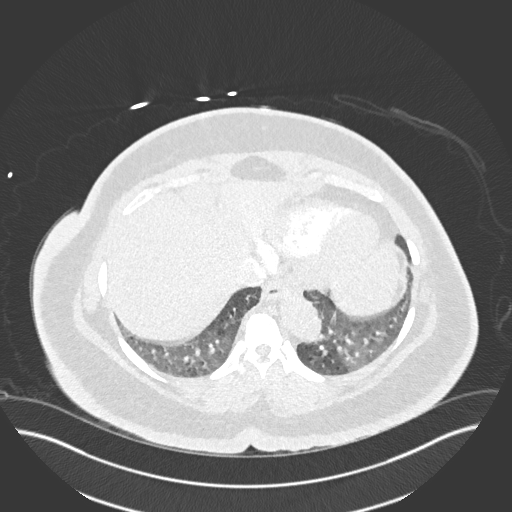
[im 115/383  mediastinal]
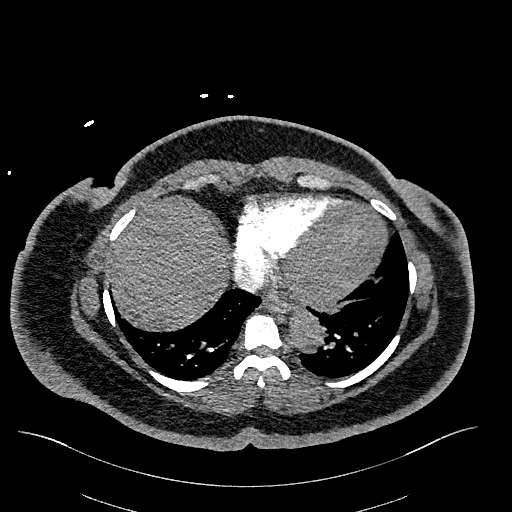
[im 134/383  lung]
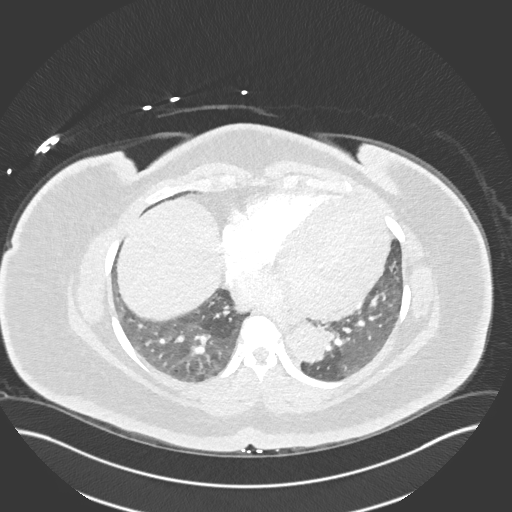
[im 153/383  mediastinal]
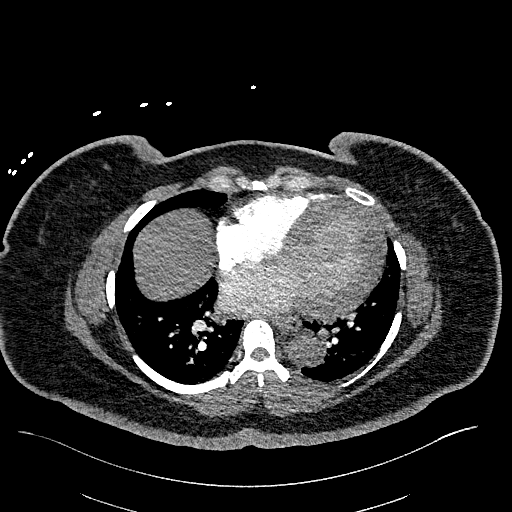
[im 172/383  lung]
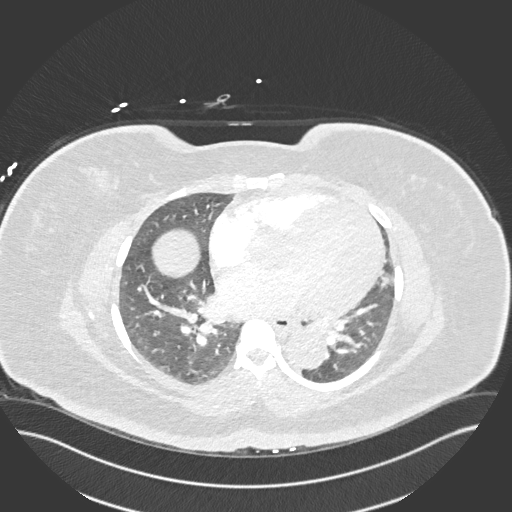
[im 211/383  mediastinal]
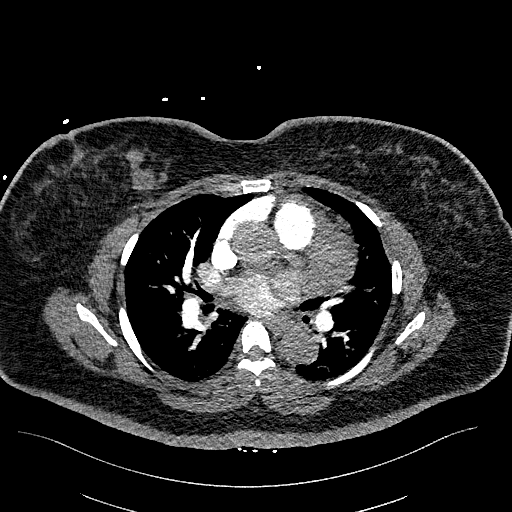
[im 230/383  lung]
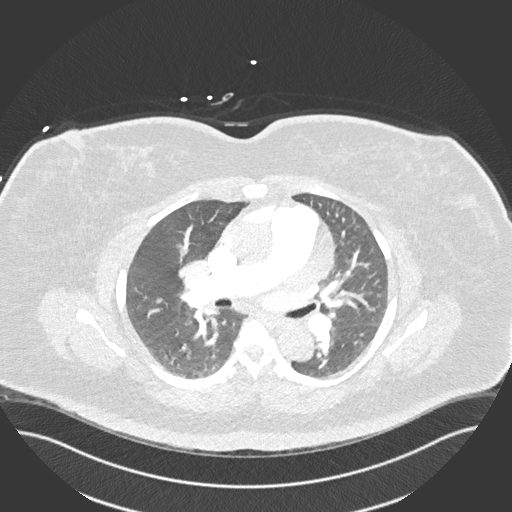
[im 249/383  mediastinal]
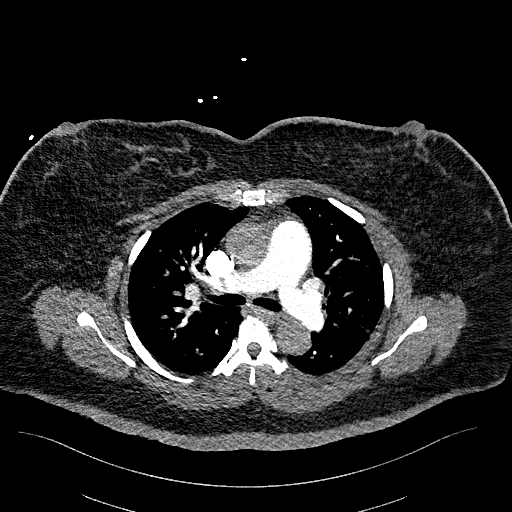
[im 268/383  lung]
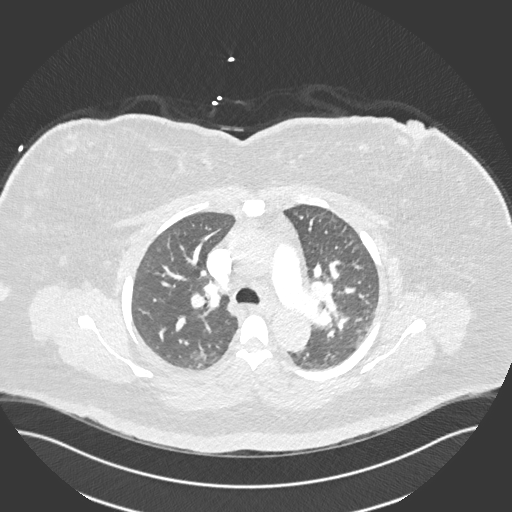
[im 287/383  mediastinal]
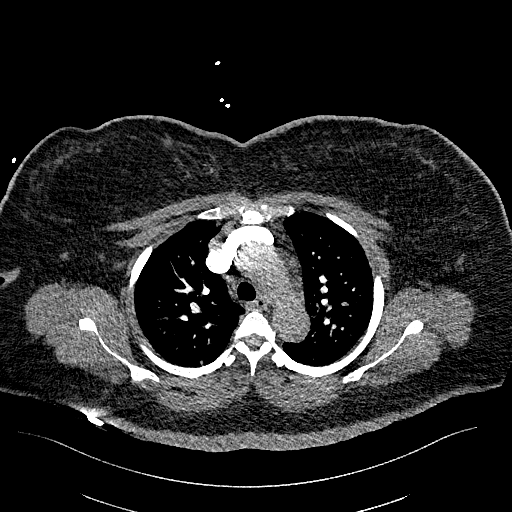
[im 306/383  lung]
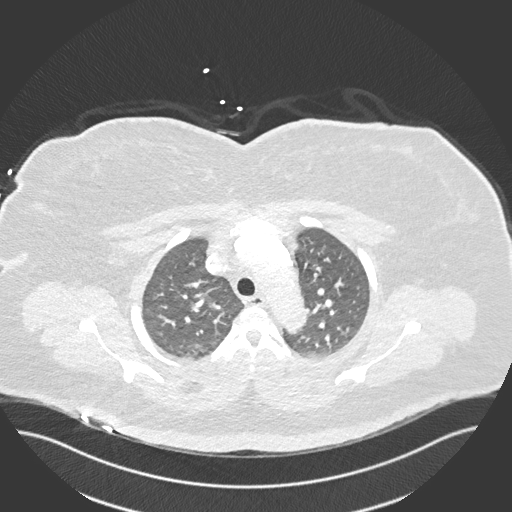
[im 325/383  mediastinal]
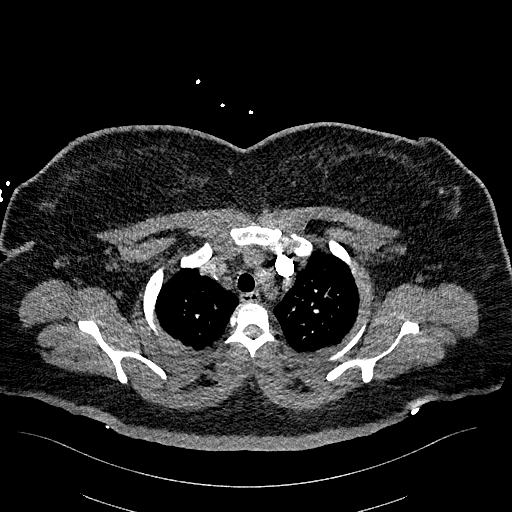
[im 344/383  lung]
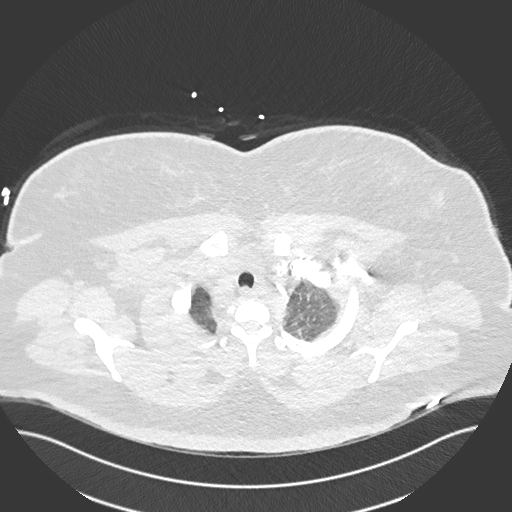
[im 363/383  mediastinal]
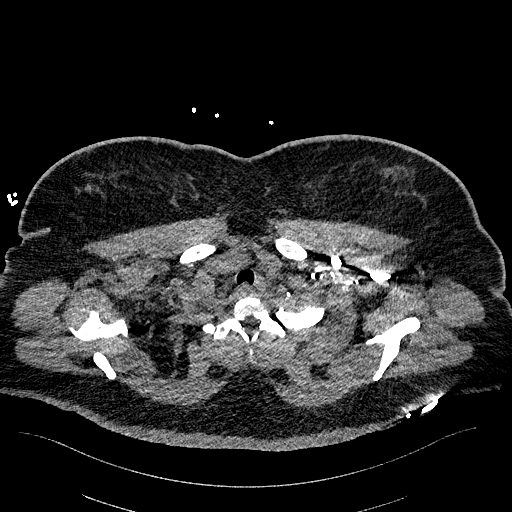

[Series 8: pe 2mm cor · coronal · 0.53mm/px · 1 of 151 slices shown]
[im 76/151  mediastinal]
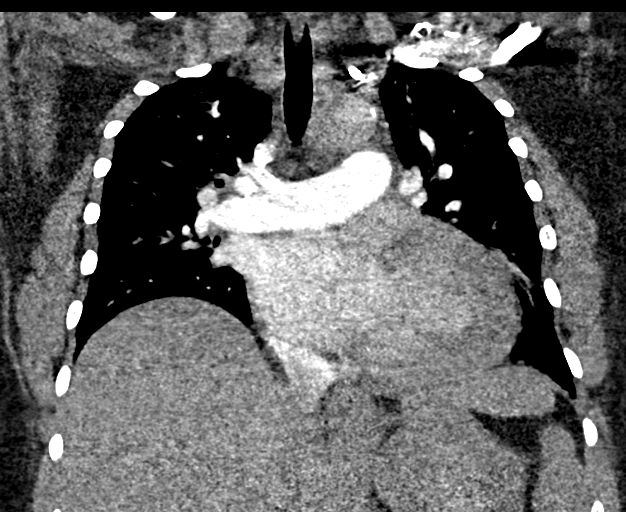

[19 of 36 positions shown; findings below may reference images not displayed]

RADIATION DOSE REDUCTION: This exam was performed according to the
departmental dose-optimization program which includes automated
exposure control, adjustment of the mA and/or kV according to
patient size and/or use of iterative reconstruction technique.

CONTRAST:  80mL OMNIPAQUE IOHEXOL 350 MG/ML SOLN
FINDINGS: Cardiovascular: There is no evidence of aortic aneurysm. The
subsegmental pulmonary arteries are limited in evaluation secondary
to suboptimal opacification with intravenous contrast. No evidence
of pulmonary embolism. Normal heart size. No pericardial effusion.

Mediastinum/Nodes: No enlarged mediastinal, hilar, or axillary lymph
nodes. Thyroid gland, trachea, and esophagus demonstrate no
significant findings.

Lungs/Pleura: Very mild linear atelectasis is seen within the
inferior aspect of the left upper lobe.

Very mild, hazy ground-glass appearance of the lung parenchyma is
seen within the bilateral lower lobes.

There is no evidence of a pleural effusion or pneumothorax.

Upper Abdomen: No acute abnormality.

Musculoskeletal: No chest wall abnormality. No acute or significant
osseous findings.

Review of the MIP images confirms the above findings.
IMPRESSION: 1. No evidence of pulmonary embolism or acute cardiopulmonary
disease.

## 2023-07-13 IMAGING — DX DG CHEST 1V PORT
1 series · 1 of 1 positions shown · non-contrast
Comparison: Chest radiographs and CTA 03/25/2021

CLINICAL DATA: Shortness of breath.  Hypertensive urgency.

EXAM:
PORTABLE CHEST 1 VIEW

[chest]
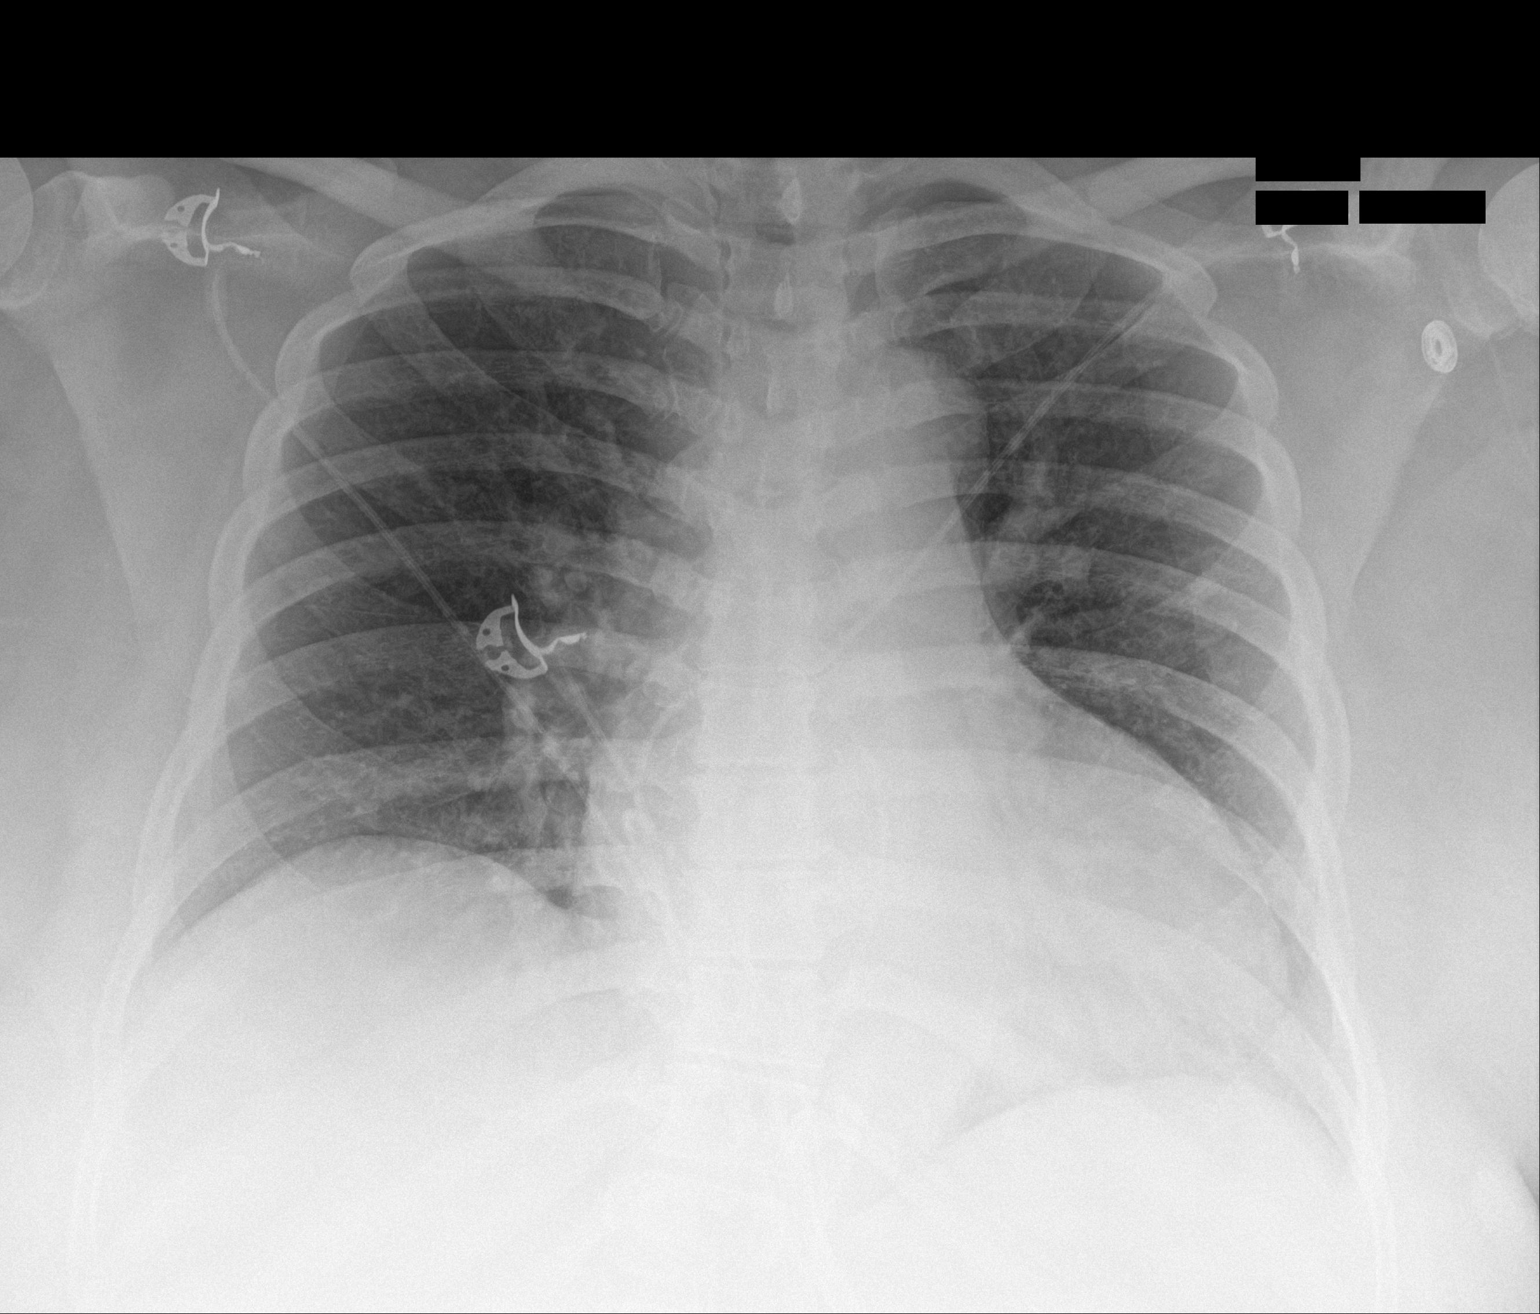

[1 of 1 positions shown; findings below may reference images not displayed]

FINDINGS: The cardiac silhouette remains mildly enlarged. There is mild
central pulmonary vascular congestion without overt edema. No
airspace consolidation, sizeable pleural effusion, or pneumothorax
is identified. There is mild elevation of the right hemidiaphragm.
IMPRESSION: Cardiomegaly and mild pulmonary vascular congestion.

## 2023-07-14 ENCOUNTER — Ambulatory Visit: Payer: Self-pay

## 2023-07-14 NOTE — Telephone Encounter (Signed)
 FYI Only or Action Required?: FYI only for provider. Patient is also asking to make an office visit appointment  Patient was last seen in primary care on 04/27/2023 by [name], DO. Called Nurse Triage reporting Chest Pain. Symptoms began a week ago. Interventions attempted: Rest, hydration, or home remedies. Symptoms are: gradually worsening.  Triage Disposition: Go to ED Now (Notify PCP)  Patient/caregiver understands and will follow disposition?: Yes  Copied from CRM [id number]. Topic: Clinical - Red Word Triage >> Jul 14, 2023 11:50 AM Kim Werner wrote: Red Word that prompted transfer to Nurse Triage: Chest pain while doing regular activities, green mucus, nauseated and unable to sleep. Feels she has a sinus infection. Would like to reschedule the appointment she missed 06/05/23.    ----------------------------------------------------------------------- From previous Reason for Contact - Scheduling: Patient/patient representative is calling to schedule an appointment. Refer to attachments for appointment information. Reason for Disposition  [1] Chest pain (or angina) comes and goes AND [2] is happening more often (increasing in frequency) or getting worse (increasing in severity)  (Exception: Chest pains that last only a few seconds.)  Answer Assessment - Initial Assessment Questions 1. LOCATION: Where does it hurt?       Left side of chest 2. RADIATION: Does the pain go anywhere else? (e.g., into neck, jaw, arms, back)     Patient states feeling back pain 3. ONSET: When did the chest pain begin? (Minutes, hours or days)      Started about a week ago 4. PATTERN: Does the pain come and go, or has it been constant since it started?  Does it get worse with exertion?      Comes and goes 5. DURATION: How long does it last (e.g., seconds, minutes, hours)     Lasts for short periods of time 6. SEVERITY: How bad is the pain?  (e.g., Scale 1-10; mild, moderate, or severe)    -  MILD (1-3): doesn't interfere with normal activities     - MODERATE (4-7): interferes with normal activities or awakens from sleep    - SEVERE (8-10): excruciating pain, unable to do any normal activities       5 out of 10 7. CARDIAC RISK FACTORS: Do you have any history of heart problems or risk factors for heart disease? (e.g., angina, prior heart attack; diabetes, high blood pressure, high cholesterol, smoker, or strong family history of heart disease)     HTN 8. PULMONARY RISK FACTORS: Do you have any history of lung disease?  (e.g., blood clots in lung, asthma, emphysema, birth control pills)     no 9. CAUSE: What do you think is causing the chest pain?     unsure 10. OTHER SYMPTOMS: Do you have any other symptoms? (e.g., dizziness, nausea, vomiting, sweating, fever, difficulty breathing, cough)       Shortness of breath, nausea, nasal congestion.  11. PREGNANCY: Is there any chance you are pregnant? When was your last menstrual period?       no  Protocols used: Chest Pain-A-AH

## 2023-07-17 NOTE — Telephone Encounter (Signed)
 Pt stated she did not go to the ED and no longer has CP, but wants to re-schedule her missed appt. Call transferred to the front office. Appt has been schedule for 7/8 with Dr Nguyen.

## 2023-07-21 IMAGING — CT CT RENAL STONE PROTOCOL
2 of 4 series · 16 of 46 positions shown, 18 images · non-contrast
Comparison: None.

CLINICAL DATA: Right flank pain.



[Series 3: renal stone 5.0 · axial · 0.81mm/px · z∈[-1043,-603]mm · 13 of 96 slices shown, 15 images]
[im 4/96  soft-tissue]
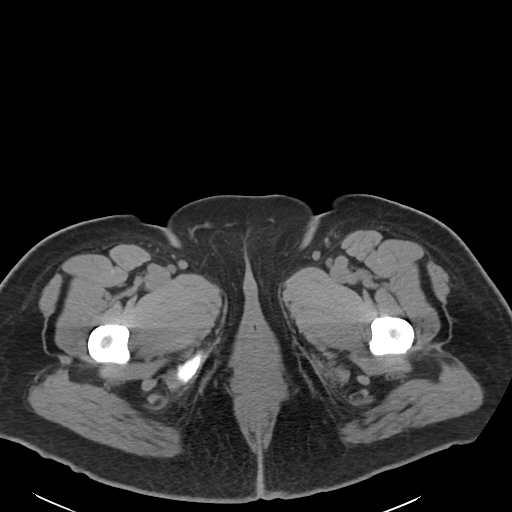
[im 4/96  bone]
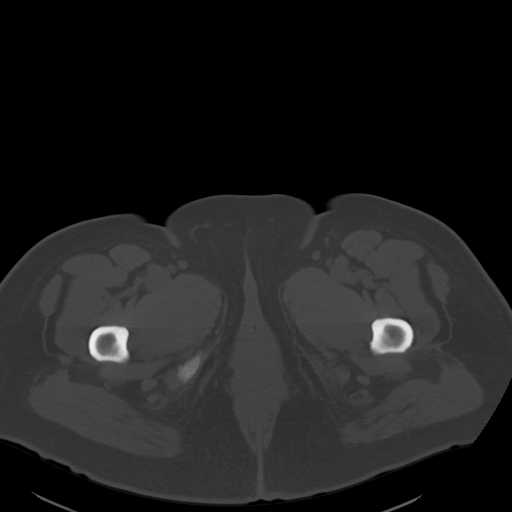
[im 12/96  soft-tissue]
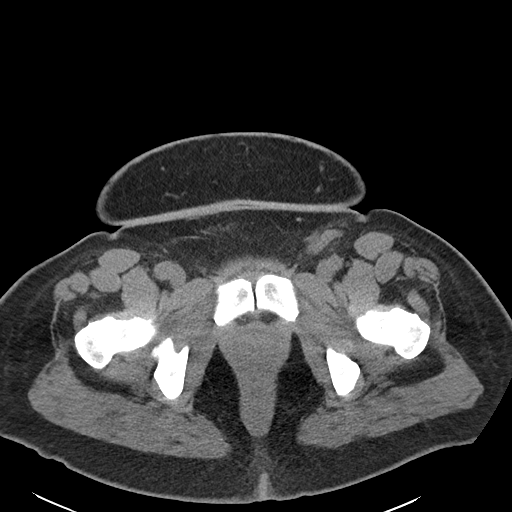
[im 20/96  soft-tissue]
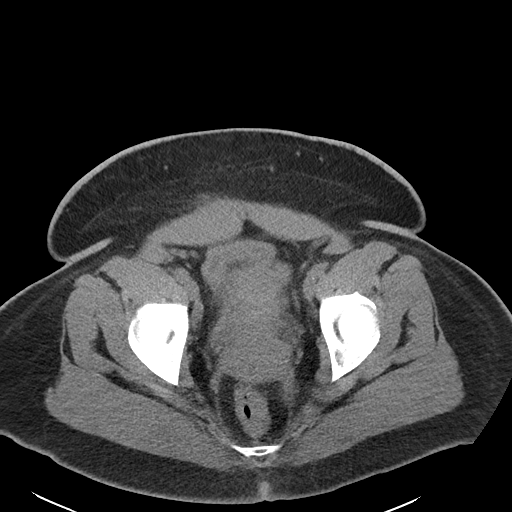
[im 28/96  soft-tissue]
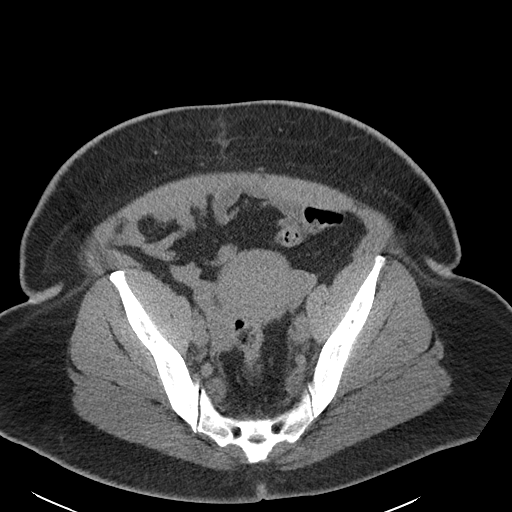
[im 32/96  soft-tissue]
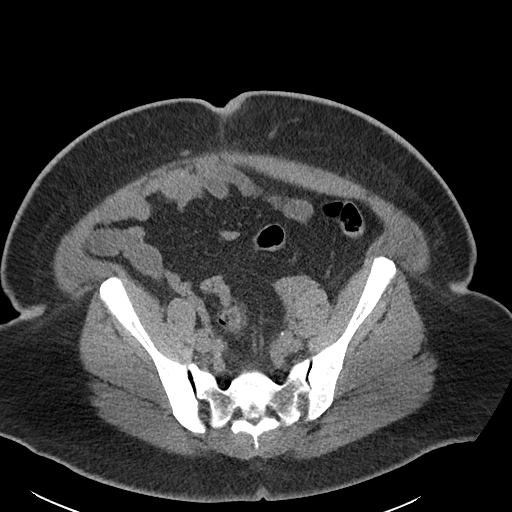
[im 40/96  soft-tissue]
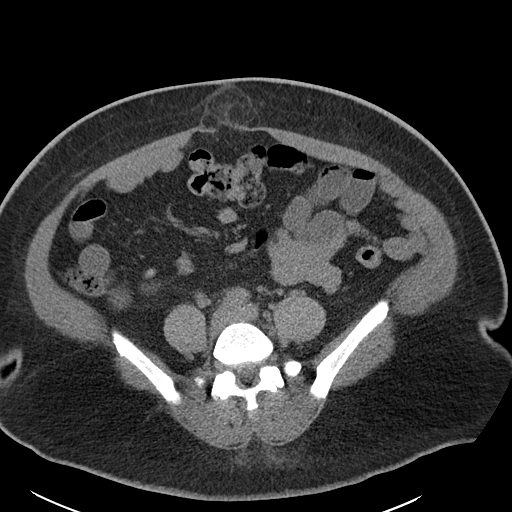
[im 48/96  soft-tissue]
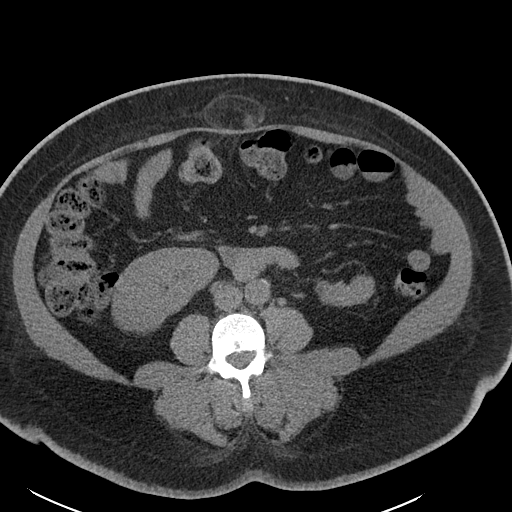
[im 56/96  soft-tissue]
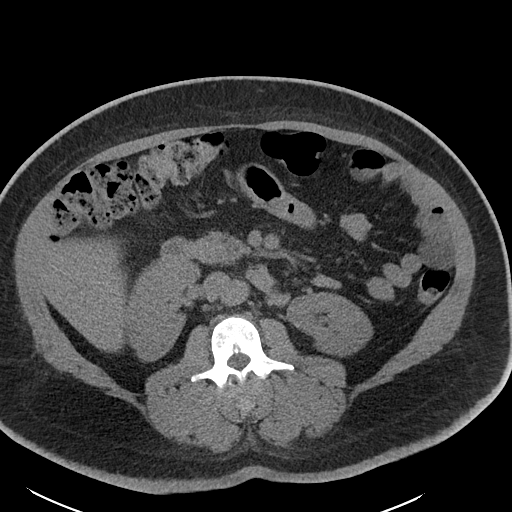
[im 64/96  soft-tissue]
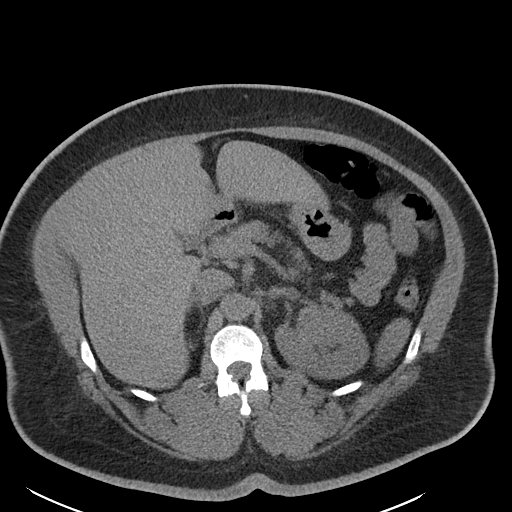
[im 64/96  bone]
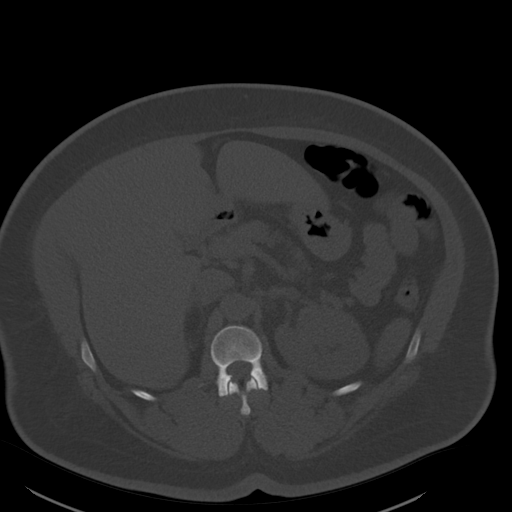
[im 68/96  soft-tissue]
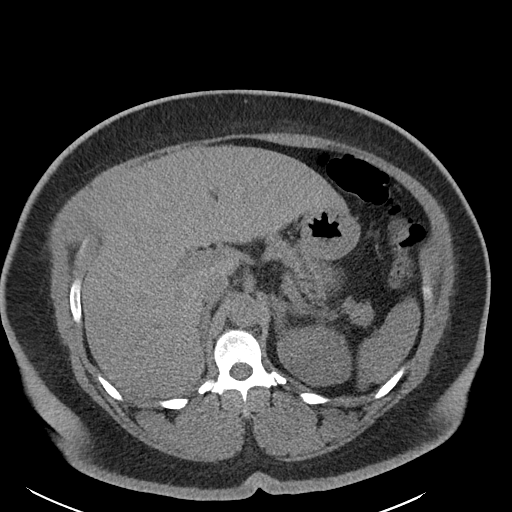
[im 76/96  soft-tissue]
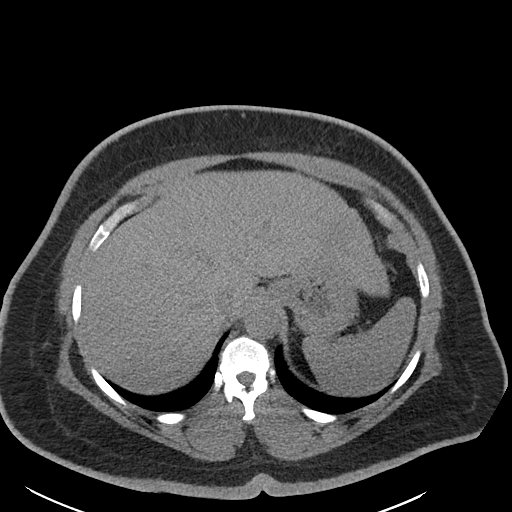
[im 84/96  soft-tissue]
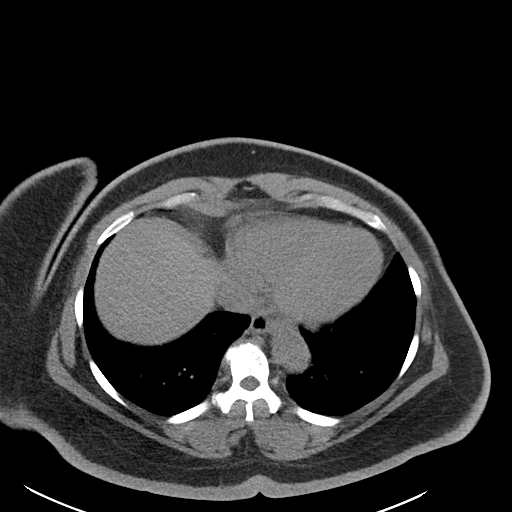
[im 92/96  soft-tissue]
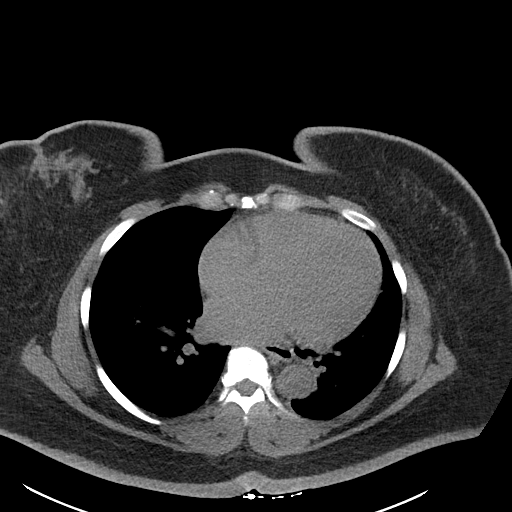

[Series 6: cor · coronal · 0.88mm/px · 3 of 172 slices shown]
[im 58/172  soft-tissue]
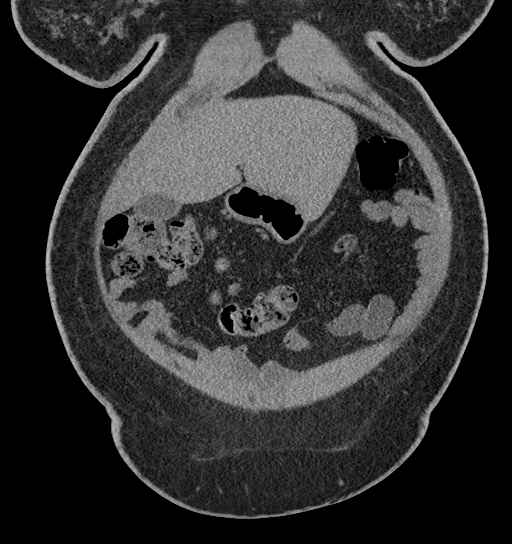
[im 77/172  soft-tissue]
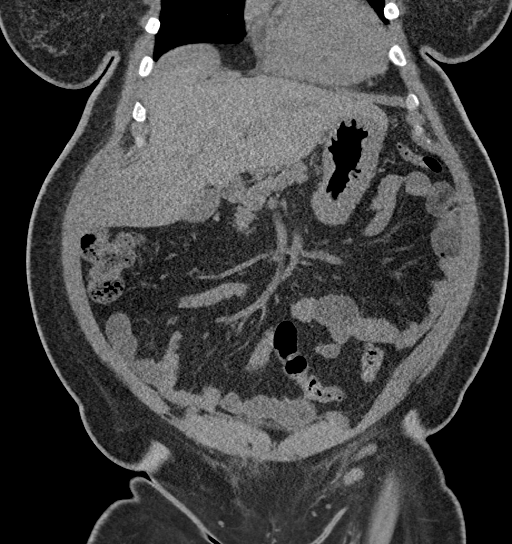
[im 96/172  soft-tissue]
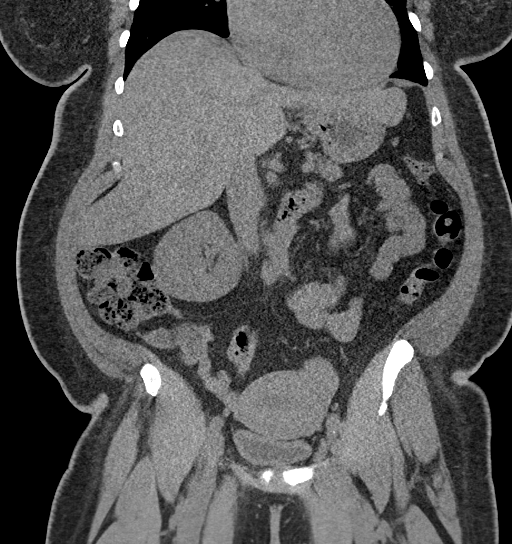

[16 of 46 positions shown; findings below may reference images not displayed]

FINDINGS: Lower chest: No acute abnormality.

Hepatobiliary: No focal liver abnormality. Probable gallstones. No
gallbladder wall thickening or biliary dilatation.

Pancreas: Unremarkable. No pancreatic ductal dilatation or
surrounding inflammatory changes.

Spleen: Normal in size without focal abnormality.

Adrenals/Urinary Tract: 2.6 cm fat containing left adrenal nodule,
consistent with myelolipoma. No follow-up imaging is recommended.
The right adrenal gland is unremarkable.

Stomach/Bowel: No renal mass, calculi, or hydronephrosis. Bladder is
unremarkable for the degree of distention.

Vascular/Lymphatic: No significant vascular findings are present. No
enlarged abdominal or pelvic lymph nodes.

Reproductive: Uterus and bilateral adnexa are unremarkable.

Other: Fat containing midline supraumbilical and left paraumbilical
hernias. 2.6 x 2.8 cm slightly irregular nodule arising from the
inferior right rectus abdominis muscle (series 3, image 81). No free
fluid or pneumoperitoneum.

Musculoskeletal: No acute or significant osseous findings.
IMPRESSION: 1. No acute intra-abdominal process.  No urolithiasis.
2. 2.6 cm slightly irregular nodule arising from the inferior right
rectus abdominis muscle, nonspecific. Differential considerations
include endometriosis if there is a history of prior C-section, as
well as abdominal wall neoplasms such as desmoid. Recommend further
evaluation with ultrasound.
3. Fat containing midline supraumbilical and left paraumbilical
hernias.
4. Probable cholelithiasis.

## 2023-07-22 ENCOUNTER — Other Ambulatory Visit: Payer: Self-pay | Admitting: Student

## 2023-07-22 DIAGNOSIS — E119 Type 2 diabetes mellitus without complications: Secondary | ICD-10-CM

## 2023-07-25 ENCOUNTER — Ambulatory Visit

## 2023-07-25 ENCOUNTER — Ambulatory Visit: Payer: Self-pay

## 2023-07-25 ENCOUNTER — Telehealth: Payer: Self-pay | Admitting: Dietician

## 2023-07-25 VITALS — BP 148/87 | HR 99 | Temp 98.4°F | Ht 67.0 in | Wt 269.0 lb

## 2023-07-25 DIAGNOSIS — E1169 Type 2 diabetes mellitus with other specified complication: Secondary | ICD-10-CM

## 2023-07-25 DIAGNOSIS — D509 Iron deficiency anemia, unspecified: Secondary | ICD-10-CM

## 2023-07-25 DIAGNOSIS — Z7985 Long-term (current) use of injectable non-insulin antidiabetic drugs: Secondary | ICD-10-CM | POA: Diagnosis not present

## 2023-07-25 DIAGNOSIS — E785 Hyperlipidemia, unspecified: Secondary | ICD-10-CM

## 2023-07-25 LAB — GLUCOSE, CAPILLARY: Glucose-Capillary: 301 mg/dL — ABNORMAL HIGH (ref 70–99)

## 2023-07-25 LAB — POCT GLYCOSYLATED HEMOGLOBIN (HGB A1C): Hemoglobin A1C: 10.4 % — AB (ref 4.0–5.6)

## 2023-07-25 MED ORDER — FERROUS SULFATE 325 (65 FE) MG PO TABS
325.0000 mg | ORAL_TABLET | ORAL | 5 refills | Status: AC
Start: 2023-07-25 — End: 2024-07-24

## 2023-07-25 MED ORDER — ATORVASTATIN CALCIUM 40 MG PO TABS
40.0000 mg | ORAL_TABLET | Freq: Every day | ORAL | 3 refills | Status: AC
Start: 2023-07-25 — End: ?

## 2023-07-25 NOTE — Telephone Encounter (Signed)
 Called patient, scheduled for CGM education. Request referral.

## 2023-07-25 NOTE — Assessment & Plan Note (Signed)
 Patient has a history of iron deficiency anemia for which she is supposed to be taking an iron supplement. She states that she has not taken it in a long time. Will recheck CBC today and refill iron supplement. Patient would like results sent to her MyChart.  - refill iron supplement - order CBC

## 2023-07-25 NOTE — Assessment & Plan Note (Signed)
 Patient presents for follow up of DMII. Prior A1C from 03/2023 was 9.0. Today in office her A1C has increased to 10.4, with a capillary glucose of 301. She states that although she has been adhering to her regimen strictly within the past two weeks, for the months leading up to 2 weeks ago, she was stressed with the end of the school year and reaching for heavy foods and fast food. 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 We discussed healthy movement goals such as walking, dancing, home workouts such as yoga, pilates or Zumba on YouTube. Patient is willing to try these. She does not wish to change her medication regimen at that time, although we do have room to increase her Mounjaro  which she is on 7.5 mg weekly right now. She was also previously prescribed a CGM, though she denies ever having worn this because she was worried it would be painful. Discussed use of this and she is now willing to try. Referral place to [facility] for CGM management. We will recheck her BMP to assess her kidney function currently. She is okay with having these results sent to MyChart.  - referral to Diabetes educator for CGM management - BMP

## 2023-07-25 NOTE — Progress Notes (Signed)
 Established Patient Office Visit  Subjective   Patient ID: Kim Werner Werner, female    DOB: 1975/07/31  Age: 48 y.o. MRN: [id number]  Chief Complaint  Patient presents with   Medical Management of Chronic Issues   Fatigue   [name] is a 48 yr old female with a history of DMII, HLD, HTN, and IDA, who presents to clinic today for diabetes recheck. She is accompanied by her 37 year old daughter Kim Werner today.   Patient's A1C in 03/2023 was 9.0, improved from 09/2022 of 11.3. Her A1C today is 10.4, with capillary glucose of 301. 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 She had an eye exam in 02/2023 which did not show evidence of retinopathy, though she has previously had abnormal eye exams.   AM sugars have been around 65-70, and then throughout the day recently high throughout the day has been about 120. She was previously prescribed a CGM device, though she has not worn this because she was afraid it would be painful. She notes that she has not been very active and that she has a varied diet with fruits, vegetables, beans, chicken, oatmeal, and rice.    ROS: Denies headaches, dizziness, fever, chills, runny nose, sore throat, vision changes, hearing changes, chest pain, shortness of breath, difficulty breathing, nausea, vomiting, abdominal pain. Denies increased urinary frequency, pain with urination, constipation or diarrhea. No recent falls.      Objective:     BP (!) 148/87 (BP Location: Left Arm, Patient Position: Sitting, Cuff Size: Small)   Pulse 99   Temp 98.4 F (36.9 C) (Oral)   Ht 5' 7 (1.702 m)   Wt 269 lb (122 kg)   SpO2 96%   BMI 42.13 kg/m   BP Readings from Last 3 Encounters:  07/25/23 (!) 148/87  04/27/23 135/86  04/03/23 (!) 146/83   Wt Readings from Last 3 Encounters:  07/25/23 269 lb (122 kg)  04/27/23 275 lb 14.4 oz (125.1 kg)  04/03/23 257 lb (116.6 kg)      Physical Exam:   Constitutional: well-appearing female sitting in exam chair, in no acute distress. Ambulates without use of assistance device  HEENT: normocephalic atraumatic, mucous membranes moist Eyes: conjunctiva non-erythematous Neck: supple Cardiovascular: regular rate and rhythm, bilateral radial pulses 2+, bilateral dorsal pedal pulses 2+, brisk capillary refill bilateral feet and hands  Pulmonary/Chest: normal work of breathing on room air, lungs clear to auscultation bilaterally Abdominal: soft, non-tender, non-distended. Small reducible umbilical hernia without strangulation, pain, or skin discoloration.  MSK: normal bulk and tone. Neurological: alert & oriented x 3, sensation intact bilateral feet to monofilament 10/10 Skin: warm and dry, no ulcers or lesions on bilateral feet Psych: mood calm, behavior normal, thought content normal, judgement normal     Results for orders placed or performed in visit on 07/25/23  Glucose, capillary  Result Value Ref Range   Glucose-Capillary 301 (H) 70 - 99 mg/dL  POC Hbg J8R  Result Value Ref Range   Hemoglobin A1C 10.4 (A) 4.0 - 5.6 %   HbA1c POC (<> result, manual entry)     HbA1c, POC (  prediabetic range)     HbA1c, POC (controlled diabetic range)      Last CBC Lab Results  Component Value Date   WBC 8.3 07/07/2022   HGB 9.6 (L) 07/07/2022   HCT 32.5 (L) 07/07/2022   MCV 71.6 (L) 07/07/2022   MCH 21.1 (L) 07/07/2022   RDW 18.2 (H) 07/07/2022   PLT 446 (H) 07/07/2022   Last metabolic panel Lab Results  Component Value Date   GLUCOSE 188 (H) 07/20/2022   NA 136 07/20/2022   K 4.1 07/20/2022   CL 97 07/20/2022   CO2 20 07/20/2022   BUN 14 07/20/2022   CREATININE 1.08 (H) 07/20/2022    EGFR 65 07/20/2022   CALCIUM  9.7 07/20/2022   PHOS 4.0 03/27/2021   PROT 7.6 08/05/2021   ALBUMIN 3.5 08/05/2021   BILITOT 0.6 08/05/2021   ALKPHOS 87 08/05/2021   AST 31 08/05/2021   ALT 31 08/05/2021   ANIONGAP 12 07/07/2022   Last hemoglobin A1c Lab Results  Component Value Date   HGBA1C 10.4 (A) 07/25/2023      The 10-year ASCVD risk score (Arnett DK, et al., 2019) is: 4.6%    Assessment & Plan:   Problem List Items Addressed This Visit       Endocrine   Hyperlipidemia associated with type 2 diabetes mellitus (HCC) (Chronic)   Patient with a history of HLD. Lipid profile from 03/2023 shows improvement of LDL from 146 to 131, and decrease in her total cholesterol from 216 to 209. She takes Atorvastatin  40 mg daily and is requesting a refill of this.  - refill Atorvastatin  40 mg daily       Relevant Medications   atorvastatin  (LIPITOR) 40 MG tablet   Type 2 diabetes mellitus with other specified complication (HCC) - Primary   Patient presents for follow up of DMII. Prior A1C from 03/2023 was 9.0. Today in office her A1C has increased to 10.4, with a capillary glucose of 301. She states that although she has been adhering to her regimen strictly within the past two weeks, for the months leading up to 2 weeks ago, she was stressed with the end of the school year and reaching for heavy foods and fast food. 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 We discussed healthy movement goals such as walking, dancing, home workouts such as yoga, pilates or Zumba on YouTube. Patient is willing to try these. She does not wish to change her medication regimen at that time, although we do have room to increase her Mounjaro  which she is on 7.5 mg weekly right now. She was also previously prescribed a CGM, though she denies ever having worn this because she was worried it would be painful. Discussed use  of this and she is now willing to try. Referral place to [facility] for CGM management. We will recheck her BMP to assess her kidney function currently. She is okay with having these results sent to MyChart.  - referral to Diabetes educator for CGM management - BMP       Relevant Medications   atorvastatin  (LIPITOR) 40 MG tablet   Other Relevant Orders   POC Hbg A1C (Completed)   BMP8+Anion Gap   Referral to Nutrition and Diabetes Services     Other   Iron deficiency anemia   Patient has a history of iron deficiency anemia for which she is supposed to be  taking an iron supplement. She states that she has not taken it in a long time. Will recheck CBC today and refill iron supplement. Patient would like results sent to her MyChart.  - refill iron supplement - order CBC       Relevant Medications   ferrous sulfate  325 (65 FE) MG tablet   Other Relevant Orders   CBC no Diff    Return in about 3 months (around 10/25/2023) for DMII. Encouraged patient to schedule her f/u with Cardiology whom she last saw 01/2023.   Patient discussed with Dr. Rosan, who also saw and evaluated the patient.  Doyal Miyamoto, MD [facility]  PGY-1  07/25/23, 5:31 PM

## 2023-07-25 NOTE — Assessment & Plan Note (Signed)
 Patient with a history of HLD. Lipid profile from 03/2023 shows improvement of LDL from 146 to 131, and decrease in her total cholesterol from 216 to 209. She takes Atorvastatin  40 mg daily and is requesting a refill of this.  - refill Atorvastatin  40 mg daily

## 2023-07-25 NOTE — Patient Instructions (Signed)
 Thank you, Kim Werner for allowing us  to provide your care today. Today we discussed the following:  - diabetes management. Please continue your Lantus  25 units daily, aspart 5 units three times daily at meals, and Mounjaro  7.5 mg weekly. - keep working on daily movement and activities, and eating healthy  - we will contact you with your results of your labs through your MyChart   I have ordered the following labs for you:  Lab Orders         Glucose, capillary         CBC no Diff         BMP8+Anion Gap         POC Hbg A1C        I have ordered the following medication/changed the following medications:   Stop the following medications: Medications Discontinued During This Encounter  Medication Reason   Acetaminophen  (TYLENOL  PO) Completed Course   ferrous sulfate  325 (65 FE) MG tablet Reorder   atorvastatin  (LIPITOR) 40 MG tablet Reorder     Start the following medications: Meds ordered this encounter  Medications   atorvastatin  (LIPITOR) 40 MG tablet    Sig: Take 1 tablet (40 mg total) by mouth daily.    Dispense:  90 tablet    Refill:  3   ferrous sulfate  325 (65 FE) MG tablet    Sig: Take 1 tablet (325 mg total) by mouth every other day.    Dispense:  30 tablet    Refill:  5     Follow up: 3 months for Internal Medicine follow up visit    Remember: Please remember to contact your cardiologist to schedule an appointment   Should you have any questions or concerns please call the Internal Medicine Clinic at [phone].     Doyal Miyamoto, MD [facility]

## 2023-07-26 LAB — BMP8+ANION GAP
Anion Gap: 18 mmol/L (ref 10.0–18.0)
BUN/Creatinine Ratio: 14 (ref 9–23)
BUN: 14 mg/dL (ref 6–24)
CO2: 20 mmol/L (ref 20–29)
Calcium: 10 mg/dL (ref 8.7–10.2)
Chloride: 95 mmol/L — ABNORMAL LOW (ref 96–106)
Creatinine, Ser: 1.01 mg/dL — ABNORMAL HIGH (ref 0.57–1.00)
Glucose: 275 mg/dL — ABNORMAL HIGH (ref 70–99)
Potassium: 3.7 mmol/L (ref 3.5–5.2)
Sodium: 133 mmol/L — ABNORMAL LOW (ref 134–144)
eGFR: 70 mL/min/1.73 (ref >59)

## 2023-07-26 LAB — CBC
Hematocrit: 34.7 % (ref 34.0–46.6)
Hemoglobin: 10.2 g/dL — ABNORMAL LOW (ref 11.1–15.9)
MCH: 20.9 pg — ABNORMAL LOW (ref 26.6–33.0)
MCHC: 29.4 g/dL — ABNORMAL LOW (ref 31.5–35.7)
MCV: 71 fL — ABNORMAL LOW (ref 79–97)
Platelets: 419 x10E3/uL (ref 150–450)
RBC: 4.89 x10E6/uL (ref 3.77–5.28)
RDW: 17.6 % — ABNORMAL HIGH (ref 11.7–15.4)
WBC: 9.3 x10E3/uL (ref 3.4–10.8)

## 2023-07-27 ENCOUNTER — Telehealth: Payer: Self-pay | Admitting: Dietician

## 2023-07-27 ENCOUNTER — Encounter: Payer: Self-pay | Admitting: Dietician

## 2023-07-27 NOTE — Telephone Encounter (Signed)
 Rescheduled to next week.

## 2023-07-31 NOTE — Progress Notes (Signed)
Internal Medicine Clinic Attending  I was physically present during the key portions of the resident provided service and participated in the medical decision making of patient's management care. I reviewed pertinent patient test results.  The assessment, diagnosis, and plan were formulated together and I agree with the documentation in the resident's note.  Gust Rung, DO

## 2023-08-01 ENCOUNTER — Encounter: Payer: Self-pay | Admitting: Dietician

## 2023-08-01 ENCOUNTER — Encounter: Payer: Self-pay | Admitting: Student

## 2023-08-10 ENCOUNTER — Ambulatory Visit: Admitting: Dietician

## 2023-08-10 ENCOUNTER — Encounter: Payer: Self-pay | Admitting: Dietician

## 2023-08-10 VITALS — Wt 266.2 lb

## 2023-08-10 DIAGNOSIS — E1169 Type 2 diabetes mellitus with other specified complication: Secondary | ICD-10-CM

## 2023-08-10 DIAGNOSIS — Z794 Long term (current) use of insulin: Secondary | ICD-10-CM

## 2023-08-10 DIAGNOSIS — E119 Type 2 diabetes mellitus without complications: Secondary | ICD-10-CM

## 2023-08-10 NOTE — Patient Instructions (Signed)
 Thank you for your visit today.  We talked about eating more plant based foods that are higher in fiber. More veggies and low fat proteins and healthy fats, whoel grain starchy foods and whole fruits  Please take some photos of what you eat so we can discuss along with blood sugars at your follow up.  You can place another sensor when this one stops if you like or bring it with you to our appointment.  I am happy to teach you how to Correct a high blood sugar if you'd like.  [place] [phone] (new number)

## 2023-08-10 NOTE — Progress Notes (Signed)
 Diabetes Self-Management Education  Visit Type: First/Initial  Appt. Start Time: 1030 Appt. End Time: 1130  08/10/2023  [name], identified by name and date of birth, is a 48 y.o. female with a diagnosis of Diabetes: Type 2.   ASSESSMENT  Kim Werner brought her receiver and dexcom G7 sensors x2. She was assisted in downloaded the app and placing and starting the sensor today. She is sharing her data remotely. Her initial blood glucose was ~ 240-250 mg/dL after eating a granola bar her ein the office and did not take any meal time insulin  today. She reports taking her lantus  25 units last night.   Weight 266 lb 3.2 oz (120.7 kg). Body mass index is 41.69 kg/m. Wt Readings from Last 10 Encounters:  08/10/23 266 lb 3.2 oz (120.7 kg)  07/25/23 269 lb (122 kg)  04/27/23 275 lb 14.4 oz (125.1 kg)  04/03/23 257 lb (116.6 kg)  01/25/23 277 lb (125.6 kg)  11/02/22 275 lb 14.4 oz (125.1 kg)  10/05/22 267 lb 4.8 oz (121.2 kg)  08/10/22 264 lb (119.7 kg)  07/20/22 261 lb 1.6 oz (118.4 kg)  02/10/22 265 lb 8 oz (120.4 kg)   Lab Results  Component Value Date   HGBA1C 10.4 (A) 07/25/2023   HGBA1C 9.0 (A) 04/03/2023   HGBA1C 11.3 (H) 10/05/2022   HGBA1C 8.7 (A) 07/20/2022   HGBA1C 8.5 (A) 11/17/2021    BP Readings from Last 3 Encounters:  07/25/23 (!) 148/87  04/27/23 135/86  04/03/23 (!) 146/83      Diabetes Self-Management Education - 08/10/23 1300       Visit Information   Visit Type First/Initial      Initial Visit   Diabetes Type Type 2    Are you currently following a meal plan? Yes   trying to eat more whole grains and vegetables, chicken and salmon   What type of meal plan do you follow? healthy foods    Are you taking your medications as prescribed? Yes   describes 25 units lantus  nightly, 5 units Humalog before meals but missed this am because she did not eat, 7.5 mg weekly of mounjaro  x 5+ weeks at least per patient     Psychosocial Assessment   Patient  Belief/Attitude about Diabetes Defeat/Burnout   states frustration at GLP-1s not wokring for her weight loss like it does for others   What is the hardest part about your diabetes right now, causing you the most concern, or is the most worrisome to you about your diabetes?   Getting support / problem solving;Other (comment)   indicates that weight loss is important to her   Self-care barriers Lack of material resources;Other (comment)    Self-management support CDE visits;Doctor's office    Patient Concerns Monitoring;Weight Control    Special Needs None    Preferred Learning Style No preference indicated    Learning Readiness Ready    How often do you need to have someone help you when you read instructions, pamphlets, or other written materials from your doctor or pharmacy? 2 - Rarely    What is the last grade level you completed in school? 16      Pre-Education Assessment   Patient understands the diabetes disease and treatment process. Needs Review    Patient understands incorporating nutritional management into lifestyle. Needs Review    Patient undertands incorporating physical activity into lifestyle. Needs Review    Patient understands using medications safely. Needs Review    Patient  understands monitoring blood glucose, interpreting and using results Needs Instruction    Patient understands prevention, detection, and treatment of acute complications. Needs Review    Patient understands how to develop strategies to promote health/change behavior. Needs Review      Complications   Last HgB A1C per patient/outside source 10.4 %    How often do you check your blood sugar? 1-2 times/day    Fasting Blood glucose range (mg/dL) 29-870;869-820    Postprandial Blood glucose range (mg/dL) --   suspect often >799 but she states recently she has been eatin ghealthier and her bloodusgars are lower. reports high degree of hunger despite GLP-1 RA therapy   Number of hypoglycemic episodes per month  0    Number of hyperglycemic episodes ( >200mg /dL): Weekly    Can you tell when your blood sugar is high? No    Have you had a dilated eye exam in the past 12 months? Yes    Have you had a dental exam in the past 12 months? --   deferred due to patients competing priorities   Are you checking your feet? Yes      Dietary Intake   Dinner beef, cabbage, browb rice    Beverage(s) 2% milk      Activity / Exercise   Activity / Exercise Type ADL's      Patient Education   Previous Diabetes Education No    Healthy Eating Role of diet in the treatment of diabetes and the relationship between the three main macronutrients and blood glucose level;Plate Method;Meal timing in regards to the patients' current diabetes medication.    Being Active Role of exercise on diabetes management, blood pressure control and cardiac health.    Medications Reviewed patients medication for diabetes, action, purpose, timing of dose and side effects.    Monitoring Taught/evaluated CGM (comment)    Acute complications Discussed and identified patients' prevention, symptoms, and treatment of hyperglycemia.    Lifestyle and Health Coping Helped patient develop diabetes management plan for (enter comment)   continueing healhty eating during busy times such as when her work starts back up again next month     Individualized Goals (developed by patient)   Monitoring  Consistenly use CGM      Post-Education Assessment   Patient understands incorporating nutritional management into lifestyle. Comprehends key points    Patient undertands incorporating physical activity into lifestyle. Comprehends key points    Patient understands using medications safely. Comphrehends key points    Patient understands monitoring blood glucose, interpreting and using results Comprehends key points    Patient understands prevention, detection, and treatment of acute complications. Comprehends key points    Patient understands how to develop  strategies to promote health/change behavior. Comprehends key points      Outcomes   Expected Outcomes Demonstrated interest in learning. Expect positive outcomes    Future DMSE 2 wks    Program Status Not Completed          Individualized Plan for Diabetes Self-Management Training:   Learning Objective:  Patient will have a greater understanding of diabetes self-management. Patient education plan is to attend individual and/or group sessions per assessed needs and concerns.   Plan:   Patient Instructions  Thank you for your visit today.  We talked about eating more plant based foods that are higher in fiber. More veggies and low fat proteins and healthy fats, whoel grain starchy foods and whole fruits  Please take some photos of what  you eat so we can discuss along with blood sugars at your follow up.  You can place another sensor when this one stops if you like or bring it with you to our appointment.  I am happy to teach you how to Correct a high blood sugar if you'd like.  Arland [phone] (new number)   Expected Outcomes:  Demonstrated interest in learning. Expect positive outcomes  Education material provided: My Plate and Diabetes Resources  If problems or questions, patient to contact team via:  Phone and Email  Future DSME appointment: 2 wks [name], RD 08/10/2023 1:32 PM.

## 2023-08-22 ENCOUNTER — Other Ambulatory Visit: Payer: Self-pay

## 2023-08-22 DIAGNOSIS — E1169 Type 2 diabetes mellitus with other specified complication: Secondary | ICD-10-CM

## 2023-08-22 MED ORDER — MOUNJARO 7.5 MG/0.5ML ~~LOC~~ SOAJ
7.5000 mg | SUBCUTANEOUS | 3 refills | Status: DC
Start: 2023-08-22 — End: 2023-10-30

## 2023-08-22 NOTE — Telephone Encounter (Signed)
 Medication sent to pharmacy

## 2023-08-24 ENCOUNTER — Ambulatory Visit: Payer: Self-pay | Admitting: Dietician

## 2023-08-24 VITALS — Wt 270.6 lb

## 2023-08-24 DIAGNOSIS — E1169 Type 2 diabetes mellitus with other specified complication: Secondary | ICD-10-CM

## 2023-08-24 NOTE — Progress Notes (Signed)
 Diabetes Self-Management Education  Visit Type: Follow-up (1st after initial)  Appt. Start Time: 1410 Appt. End Time: 1455  08/24/2023  Kim Werner, identified by name and date of birth, is a 48 y.o. female with a diagnosis of Diabetes:  .   ASSESSMENT  Weight 270 lb 9.6 oz (122.7 kg). Body mass index is 42.38 kg/m. Weight increased may be due to improved blood sugar, less glycosuria.  She wore the CGM and liked it but forgot to bring another. She was provided with a sample today per her request and she applied it to build her confidence at putting it on and starting it. Today she says she feel confident that she can remove this one and apply and start another.   She learned how rice spikes her blood sugar and is trying to eat more vegetables and lean protein, but finds the lean protein is expensive. We discussed lean protein and protein alternatives.   CGM Results from download: 08/07/23-08/20/23  % Time CGM active:   95 %   (Goal >70%)  Average glucose:   264 mg/dL for 11 days  Glucose management indicator:   9.6 %  Time in range (70-180 mg/dL):   4 %   (Goal >29%)  Time High (181-250 mg/dL):   40 %   (Goal < 74%)  Time Very High (>250 mg/dL):    56 %   (Goal < 5%)  Time Low (54-69 mg/dL):   0 %   (Goal <5%)  Time Very Low (<54 mg/dL):   <1 %   (Goal <8%)  Coefficient of variation:   21.2 %   (Goal <36%)    She had one low blood sugar the same day we applied the sensor. She went home and took her insulin  (after eating granola bar here in the office)  and only ate salad.   Her goal is to have her blood sugar be < 200 most times and she would like her Mounjaro  ( she is tolerating the current dose) and mealtime insulin  increased. She has noticed that the high blood sugars are making her hungry especially at night.   She is due to follow up in October with her doctor.    Diabetes Self-Management Education - 08/24/23 1500       Visit Information   Visit Type Follow-up   1st  after initial     Health Coping   How would you rate your overall health? Good   she states that she has been taking walks, eating more plant foods since our last visit     Psychosocial Assessment   Self-management support Family   talked about her spouse having diabetes and taking walks with her and theri daughter   Other persons present Family Member   had two children with her today     Pre-Education Assessment   Patient understands prevention, detection, and treatment of chronic complications. Compreheands key points    Patient understands how to develop strategies to address psychosocial issues. Comprehends key points      Dietary Intake   Breakfast 2 eggs, cabbage, tomatoes    Lunch [employer] chicken sandwich    Dinner oxtails and rice or   Chicken salad with avocado, carrots, and cherry tomatoes     Activity / Exercise   Activity / Exercise Type ADL's;Light (walking / raking leaves)    How many days per week do you exercise? 3    How many minutes per day do you exercise? 30  Total minutes per week of exercise 90      Patient Education   Previous Diabetes Education Yes    Healthy Eating Meal options for control of blood glucose level and chronic complications.;Reviewed blood glucose goals for pre and post meals and how to evaluate the patients' food intake on their blood glucose level.    Medications Other (comment)   she asked how she goes about getting her mealtime insulin  and Mounjaro  doses increased. she states she increased her mealtime insulin  one time and thought it helped.   Monitoring Taught/discussed recording of test results and interpretation of SMBG.;Taught/evaluated CGM (comment)      Individualized Goals (developed by patient)   Monitoring  Consistenly use CGM      Patient Self-Evaluation of Goals - Patient rates self as meeting previously set goals (% of time)   Monitoring >75% (most of the time)      Post-Education Assessment   Patient understands  incorporating nutritional management into lifestyle. Comprehends key points    Patient undertands incorporating physical activity into lifestyle. Comprehends key points    Patient understands monitoring blood glucose, interpreting and using results Comprehends key points    Patient understands prevention, detection, and treatment of acute complications. Comprehends key points      Outcomes   Expected Outcomes Demonstrated interest in learning but significant barriers to change   today her barrier was her children   Future DMSE 4-6 wks    Program Status Not Completed      Subsequent Visit   Since your last visit have you continued or begun to take your medications as prescribed? Yes    Since your last visit have you had your blood pressure checked? No    Since your last visit have you experienced any weight changes? Gain    Weight Gain (lbs) 4    Since your last visit, are you checking your blood glucose at least once a day? Yes   likes CGM, did not like the alarms for high blood sugars         Individualized Plan for Diabetes Self-Management Training:   Learning Objective:  Patient will have a greater understanding of diabetes self-management. Patient education plan is to attend individual and/or group sessions per assessed needs and concerns.   Plan:   Patient Instructions  Your blood sugars are improving way to go!!!!  Goal is to have more vegetables, add more lean protein ( beans, salmon, chicken, Greek yogurt , cheese sticks)   I will ask your doctor about increasing Mounjaro  and your insulin  before meals  You can put a new sensor on in 10-11 days.   Keep waking - that decreases your insulin  resistance amongst other things.   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

## 2023-08-24 NOTE — Patient Instructions (Addendum)
 Your blood sugars are improving way to go!!!!  Goal is to have more vegetables, add more lean protein ( beans, salmon, chicken, Greek yogurt , cheese sticks)   I will ask your doctor about increasing Mounjaro  and your insulin  before meals  You can put a new sensor on in 10-11 days.   Keep waking - that decreases your insulin  resistance amongst other things.   Arland [phone] (new number)

## 2023-09-14 ENCOUNTER — Ambulatory Visit: Payer: Self-pay | Admitting: Dietician

## 2023-09-14 NOTE — Progress Notes (Signed)
 This is a telephone encounter between [facility]  and Kim Werner  on 09/14/2023 for Diabetes Self Management Education & Support . The visit was conducted with the patient located at home and Kim Werner  at [facility]. The patient's identity was confirmed using their DOB and current address. The patient has consented to being evaluated through a telephone encounter and understands the associated risks / benefits (allows the patient to remain at home, decreasing exposure to coronavirus). I personally spent 19 minutes on medical nutrition therapy discussion.   The following statements were read to the patient and/or legal guardian that are established with the [facility] Provider.   The purpose of this phone visit is to provide behavioral health care while limiting exposure to the coronavirus (COVID19).  There is a possibility of technology failure and discussed alternative modes of communication if that failure occurs.   By engaging in this telephone visit, you consent to the provision of healthcare.  Additionally, you authorize for your insurance to be billed for the services provided during this telephone visit.    Patient and/or legal guardian consented to telephone visit: yes  Diabetes Self-Management Education  Visit Type: Follow-up (2 after initial)  Appt. Start Time: 345 PM  Appt. End Time: 404 PM  09/14/2023  [name], identified by name and date of birth, is a 48 y.o. female with a diagnosis of Diabetes:  .   ASSESSMENT   She states she is still walking with daughter and has changed some things about how she plans meals and eats. She does not have any questions or concerns or wants addition al information or ideas.   Continues to desire increased doses of insulin  and tirzepatide .    Diabetes Self-Management Education - 09/14/23 1500       Visit Information   Visit Type Follow-up   2 after initial     Health Coping   How would you rate your  overall health? --   Better than she was feeling     Complications   How often do you check your blood sugar? > 4 times/day   used CGM x 30 days and liked it. has not worn sensor since starting school, but intends to put one on soon. No symptoms of low, has symptoms of high a few times weekly   Fasting Blood glucose range (mg/dL) >799    Postprandial Blood glucose range (mg/dL) >799    Number of hypoglycemic episodes per month 0    Number of hyperglycemic episodes ( >200mg /dL): Weekly    Can you tell when your blood sugar is high? Yes    What do you do if your blood sugar is high? goes for a walk      Patient Education   Disease Pathophysiology Definition of diabetes, type 1 and 2, and the diagnosis of diabetes;Factors that contribute to the development of diabetes;Explored patient's options for treatment of their diabetes      Individualized Goals (developed by patient)   Nutrition Follow meal plan discussed      Patient Self-Evaluation of Goals - Patient rates self as meeting previously set goals (% of time)   Nutrition 25 - 50% (sometimes)    Monitoring 50 - 75 % (half of the time)      Outcomes   Expected Outcomes Demonstrated interest in learning. Expect positive outcomes    Future DMSE 2 months    Program Status Not Completed      Subsequent Visit  Since your last visit have you continued or begun to take your medications as prescribed? Yes   is tkaing 25 Lantus  daily and   5-5-5 Novolog  (wants it increased to 8-8-8)  Mounjaro - 7.5 mg tolerating, curbs appetite, but wants it increased   Since your last visit have you had your blood pressure checked? Yes    Is your most recent blood pressure lower, unchanged, or higher since your last visit? Lower    Since your last visit have you experienced any weight changes? --   she has not weighed   Since your last visit, are you checking your blood glucose at least once a day? Yes          Individualized Plan for Diabetes  Self-Management Training:   Learning Objective:  Patient will have a greater understanding of diabetes self-management. Patient education plan is to attend individual and/or group sessions per assessed needs and concerns.   Plan:   There are no Patient Instructions on file for this visit.  Expected Outcomes:  Demonstrated interest in learning. Expect positive outcomes  Education material provided: Diabetes Resources  If problems or questions, patient to contact team via:  Phone and Email  Future DSME appointment: 2 months same day she sees doctor for A1c Kim Werner, RD 09/14/2023 4:09 PM.

## 2023-09-14 NOTE — Patient Instructions (Signed)
 Hi [employer],  Thank you for your time today!  I'll look at your blood sugars in two weeks to see how they differ or not when working.   Keep up the great work of walking daily and making healthier food choices.   We should be in touch with an update on your diabetes medication request.  See yo in October.  Arland [phone] (new number)

## 2023-09-28 ENCOUNTER — Other Ambulatory Visit: Payer: Self-pay | Admitting: Student

## 2023-09-28 DIAGNOSIS — E119 Type 2 diabetes mellitus without complications: Secondary | ICD-10-CM

## 2023-09-28 MED ORDER — MOUNJARO 10 MG/0.5ML ~~LOC~~ SOAJ: 10.0000 mg | SUBCUTANEOUS | 2 refills | Status: DC

## 2023-09-28 MED ORDER — INSULIN ASPART 100 UNIT/ML FLEXPEN: 8.0000 [IU] | PEN_INJECTOR | Freq: Three times a day (TID) | SUBCUTANEOUS | 11 refills | Status: DC

## 2023-09-28 MED ORDER — INSULIN PEN NEEDLE 32G X 4 MM MISC: 1.0000 | Freq: Every day | 8 refills | Status: AC

## 2023-09-29 ENCOUNTER — Telehealth: Payer: Self-pay

## 2023-09-29 NOTE — Telephone Encounter (Signed)
 Prior authorization submitted for MOUNJARO  10MG  to [facility] via Lat[employer].   Key: [id number]

## 2023-10-02 NOTE — Telephone Encounter (Signed)
Per insurance ''Your PA has been resolved, no additional PA is required. For further inquiries please contact the number on the back of the member prescription card.''

## 2023-10-30 ENCOUNTER — Other Ambulatory Visit: Payer: Self-pay

## 2023-10-30 ENCOUNTER — Encounter: Payer: Self-pay | Admitting: Student

## 2023-10-30 ENCOUNTER — Ambulatory Visit: Admitting: Student

## 2023-10-30 VITALS — BP 156/91 | HR 95 | Temp 98.1°F | Ht 67.0 in | Wt 272.6 lb

## 2023-10-30 DIAGNOSIS — E119 Type 2 diabetes mellitus without complications: Secondary | ICD-10-CM

## 2023-10-30 DIAGNOSIS — Z79899 Other long term (current) drug therapy: Secondary | ICD-10-CM

## 2023-10-30 DIAGNOSIS — Z9189 Other specified personal risk factors, not elsewhere classified: Secondary | ICD-10-CM

## 2023-10-30 DIAGNOSIS — I1A0 Resistant hypertension: Secondary | ICD-10-CM | POA: Diagnosis not present

## 2023-10-30 DIAGNOSIS — Z0000 Encounter for general adult medical examination without abnormal findings: Secondary | ICD-10-CM

## 2023-10-30 DIAGNOSIS — E785 Hyperlipidemia, unspecified: Secondary | ICD-10-CM | POA: Diagnosis not present

## 2023-10-30 DIAGNOSIS — Z7985 Long-term (current) use of injectable non-insulin antidiabetic drugs: Secondary | ICD-10-CM

## 2023-10-30 DIAGNOSIS — R29818 Other symptoms and signs involving the nervous system: Secondary | ICD-10-CM | POA: Diagnosis not present

## 2023-10-30 DIAGNOSIS — Z1211 Encounter for screening for malignant neoplasm of colon: Secondary | ICD-10-CM

## 2023-10-30 DIAGNOSIS — Z794 Long term (current) use of insulin: Secondary | ICD-10-CM

## 2023-10-30 DIAGNOSIS — E1169 Type 2 diabetes mellitus with other specified complication: Secondary | ICD-10-CM

## 2023-10-30 DIAGNOSIS — Z6841 Body Mass Index (BMI) 40.0 and over, adult: Secondary | ICD-10-CM

## 2023-10-30 DIAGNOSIS — E66813 Obesity, class 3: Secondary | ICD-10-CM

## 2023-10-30 DIAGNOSIS — K429 Umbilical hernia without obstruction or gangrene: Secondary | ICD-10-CM | POA: Insufficient documentation

## 2023-10-30 LAB — POCT GLYCOSYLATED HEMOGLOBIN (HGB A1C): HbA1c, POC (controlled diabetic range): 9.9 % — AB (ref 0.0–7.0)

## 2023-10-30 LAB — GLUCOSE, CAPILLARY: Glucose-Capillary: 233 mg/dL — ABNORMAL HIGH (ref 70–99)

## 2023-10-30 MED ORDER — MOUNJARO 15 MG/0.5ML ~~LOC~~ SOAJ: 15.0000 mg | SUBCUTANEOUS | 6 refills | Status: AC

## 2023-10-30 MED ORDER — MOUNJARO 12.5 MG/0.5ML ~~LOC~~ SOAJ: 12.5000 mg | SUBCUTANEOUS | 0 refills | Status: AC

## 2023-10-30 MED ORDER — LANTUS SOLOSTAR 100 UNIT/ML ~~LOC~~ SOPN: 25.0000 [IU] | PEN_INJECTOR | Freq: Every day | SUBCUTANEOUS | 0 refills | Status: DC

## 2023-10-30 MED ORDER — OLMESARTAN-AMLODIPINE-HCTZ 40-10-25 MG PO TABS: 1.0000 | ORAL_TABLET | Freq: Every day | ORAL | 3 refills | Status: AC

## 2023-10-30 NOTE — Assessment & Plan Note (Addendum)
 Not at goal earlier this year.  Repeat lipids today. Continue statin therapy atorvastatin  40.

## 2023-10-30 NOTE — Addendum Note (Signed)
 Addended by: ANTONE DWAYNE SAILOR on: 10/30/2023 05:16 PM   Modules accepted: Orders

## 2023-10-30 NOTE — Assessment & Plan Note (Signed)
 Not at goal today.  Office pressure 156/91.  She says this is abnormal for her, home measurements are usually 120s over 80s. - Continue olmesartan , amlodipine , HCTZ 40 - 10 - 25 daily - Screen for sleep apnea - Can consider additional therapy if continued to be elevated at future visits and home measurements are also elevated

## 2023-10-30 NOTE — Progress Notes (Signed)
 CC: no acute concerns, T2DM follow up  HPI:  [name] is a 48 y.o. female with a PMH stated below who presents today for 3 month checkup.  Please see problem based assessment and plan for additional details.  Past Medical History:  Diagnosis Date   Candida vaginitis 12/03/2021   Diabetes mellitus without complication (HCC)    Hyperlipidemia associated with type 2 diabetes mellitus (HCC)    Hypertensive heart disease with chronic diastolic congestive heart failure (HCC) 03/2021   Severe LVH with EF 50-55%: Has been admitted twice with profound hypotension, most recentlyPulm edema in July 2023.   Morbid obesity due to excess calories (HCC)    Resistant hypertension    On high-dose carvedilol , losartan , HCTZ and amlodipine    Review of Systems: ROS negative except for what is noted on the assessment and plan.  Vitals:   10/30/23 1543 10/30/23 1544 10/30/23 1613  BP: (!) 153/88 (!) 151/85 (!) 156/91  Pulse: 98 99 95  Temp: 98.1 F (36.7 C)    TempSrc: Oral    SpO2: 100%    Weight: 272 lb 9.6 oz (123.7 kg)    Height: 5' 7 (1.702 m)     Physical Exam: Constitutional: well-appearing woman in no acute distress Cardiovascular: regular rate and rhythm, no m/r/g Pulmonary/Chest: normal work of breathing on room air, lungs clear to auscultation bilaterally Abdominal: soft, non-tender, non-distended. A non tender and reducible periumbilical hernia about 1 inch diameter is present MSK: normal bulk and tone Skin: warm and dry Psych: normal mood and behavior  Assessment & Plan:   Patient discussed with Dr. Francesco  Type 2 diabetes mellitus with other specified complication [facility]) Not controlled.  A1c is 9.9 down from 10.4 at last check.  Currently on insulin  and Mounjaro .  Metformin causes GI distress, SGLT2's caused yeast infections.  In the past there is some issues with compliance, no longer the case. 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  Doing fingersticks.  Morning sugars around 140, spikes to above 200 with food. No hypoglycemia.  Has seen [facility], tracks her calories, portion control is in place. - Continue insulin , aspart 8 with meals and Lantus  25 daily.   - Increase Mounjaro  to 12.5 weekly x 4 weeks then 15 weekly thereafter indefinitely - would like to decrease insulin  in future if able, would help with her weight. Not able to do so now.  Hyperlipidemia associated with type 2 diabetes mellitus (HCC) Not at goal earlier this year.  Repeat lipids today. Continue statin therapy atorvastatin  40.  Resistant hypertension Not at goal today.  Office pressure 156/91.  She says this is abnormal for her, home measurements are usually 120s over 80s. - Continue olmesartan , amlodipine , HCTZ 40 - 10 - 25 daily - Screen for sleep apnea - Can consider additional therapy if continued to be elevated at future visits and home measurements are also elevated  Obesity, Class III, BMI 40-49.9 (morbid obesity) (HCC) BMI 42.  This is up from 40 one year ago.  Has spoken with her nutritionist Ms. Plyler.  Checks calories does portion control.  Emphasized nonetheless.  If maximum dose of Mounjaro  does not induce weight loss, might need to consider referral to weight loss clinic.  At risk for sleep apnea Referral for sleep study.   Health care maintenance GI order for colonoscopy.  Umbilical hernia Not in pain or incarcerated. Offered referral to surgeon, she will consider. Urgent repair  not indicated.  RTC 3 months for A1c, obesity, HTN.  Lonni Africa, D.O. [facility] Internal Medicine, PGY-2 Phone: [phone] Date 10/30/2023 Time 4:47 PM

## 2023-10-30 NOTE — Assessment & Plan Note (Signed)
 GI order for colonoscopy.

## 2023-10-30 NOTE — Patient Instructions (Signed)
 Increase mounjaro  to 12.5mg  weekly x 4 weeks then 15mg  weekly therafter.  Please return in 3 months for checkup.  Expect a call to set up sleep study. If you cannot get in touch with them in one week, call our clinic to report this and request another referral.

## 2023-10-30 NOTE — Assessment & Plan Note (Signed)
 BMI 42.  This is up from 40 one year ago.  Has spoken with her nutritionist Ms. Plyler.  Checks calories does portion control.  Emphasized nonetheless.  If maximum dose of Mounjaro  does not induce weight loss, might need to consider referral to weight loss clinic.

## 2023-10-30 NOTE — Assessment & Plan Note (Signed)
 Not in pain or incarcerated. Offered referral to surgeon, she will consider. Urgent repair not indicated.

## 2023-10-30 NOTE — Assessment & Plan Note (Signed)
 Referral for sleep study

## 2023-10-30 NOTE — Assessment & Plan Note (Addendum)
 Not controlled.  A1c is 9.9 down from 10.4 at last check.  Currently on insulin  and Mounjaro .  Metformin causes GI distress, SGLT2's caused yeast infections.  In the past there is some issues with compliance, no longer the case. 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  Doing fingersticks.  Morning sugars around 140, spikes to above 200 with food. No hypoglycemia.  Has seen [facility], tracks her calories, portion control is in place. - Continue insulin , aspart 8 with meals and Lantus  25 daily.   - Increase Mounjaro  to 12.5 weekly x 4 weeks then 15 weekly thereafter indefinitely - would like to decrease insulin  in future if able, would help with her weight. Not able to do so now.

## 2023-11-01 NOTE — Progress Notes (Signed)
 Internal Medicine Clinic Attending  Case discussed with the resident at the time of the visit.  We reviewed the resident's history and exam and pertinent patient test results.  I agree with the assessment, diagnosis, and plan of care documented in the resident's note.

## 2023-11-09 ENCOUNTER — Other Ambulatory Visit

## 2023-11-27 ENCOUNTER — Other Ambulatory Visit (HOSPITAL_COMMUNITY): Payer: Self-pay

## 2023-12-06 ENCOUNTER — Institutional Professional Consult (permissible substitution): Admitting: Neurology

## 2023-12-06 ENCOUNTER — Encounter: Payer: Self-pay | Admitting: Neurology

## 2024-01-26 ENCOUNTER — Ambulatory Visit: Payer: Self-pay

## 2024-01-26 NOTE — Telephone Encounter (Signed)
 FYI Only or Action Required?: FYI only for provider: appointment scheduled on 01/29/24.  Patient was last seen in primary care on 10/30/2023 by Harrie Bruckner, DO.  Called Nurse Triage reporting Breast Pain.  Symptoms began several weeks ago.  Interventions attempted: OTC medications: tylenol .  Symptoms are: unchanged.  Triage Disposition: See Physician Within 24 Hours  Patient/caregiver understands and will follow disposition?: Yes     Copied from CRM #[id number]. Topic: Clinical - Red Word Triage >> Jan 26, 2024 10:47 AM Diannia H wrote: Red Word that prompted transfer to Nurse Triage: Patient has a lump on her left breast and its so painful she can't sleep at night. Her nipples are sore as well. Pain level 1-10 pain is a 9 for about 2 weeks and as the days go on its getting worse.   Reason for Disposition  [1] Breast looks infected (spreading redness, feels hot or painful to touch) AND [2] no fever  Answer Assessment - Initial Assessment Questions Pt called in to report lump on L breast next to areola. Pt states pain 9/10 when she touches it or pressure is applied. Pt states she sleeps on her side and if she rolls over, the pain wakes her up. Pt denies any fever, no drainage, no s/s of infection. Pt reports she has taken tylenol  but does not like to take pills. Discussed application of warm compress 3x a day for 15 minutes and tylenol  PRN for pain. Discussed eval by provider and pt agreeable. Appointment scheduled for evaluation. Patient agrees with plan of care, and will call back if anything changes, or if symptoms worsen.       1. SYMPTOM: What's the main symptom you're concerned about?  (e.g., lump, nipple discharge, pain, rash)     L breast lump on outside; pt states she thought it was a cyst but unable to pop or drain site   2. LOCATION: Where is the lump located?     L breast, next to areola   3. ONSET: When did symptoms  start?     2 weeks ago   5. CAUSE:  What do you think is causing this symptom?     Unsure   6. OTHER SYMPTOMS: Do you have any other symptoms? (e.g., breast pain, fever, nipple discharge, redness or rash)     Pain with pressure/touch; no discharge, no fever  Protocols used: Breast Symptoms-A-AH

## 2024-01-29 ENCOUNTER — Other Ambulatory Visit: Payer: Self-pay

## 2024-01-29 ENCOUNTER — Ambulatory Visit: Payer: Self-pay

## 2024-01-29 VITALS — BP 141/82 | HR 95 | Temp 98.1°F | Ht 67.0 in | Wt 266.4 lb

## 2024-01-29 DIAGNOSIS — N6322 Unspecified lump in the left breast, upper inner quadrant: Secondary | ICD-10-CM | POA: Insufficient documentation

## 2024-01-29 MED ORDER — DOXYCYCLINE HYCLATE 100 MG PO TABS: 100.0000 mg | ORAL_TABLET | Freq: Two times a day (BID) | ORAL | 0 refills | Status: AC

## 2024-01-29 NOTE — Patient Instructions (Signed)
 Thank you, [name], for allowing us  to provide your care today. Today we discussed . . .  > Breast Lump       - It is difficult to tell if your breast lump is due to an infection or something else.  It may be a combination of both.  We will treat you with antibiotics doxycycline  100 mg twice daily for 7 days.  We will also order a diagnostic mammogram. > Chronic health conditions       - Please schedule a follow-up appointment to discuss your chronic health conditions    I have ordered the following labs for you:  Lab Orders  No laboratory test(s) ordered today      Referrals ordered today:   Referral Orders  No referral(s) requested today      Follow up: 2 weeks to discuss diabetes    Remember:  Should you have any questions or concerns please call the internal medicine clinic at [phone].     Melvenia Morrison, [facility]

## 2024-01-29 NOTE — Progress Notes (Signed)
 Internal Medicine Clinic Attending  I was physically present during the key portions of the resident provided service and participated in the medical decision making of patient's management care. I reviewed pertinent patient test results.  The assessment, diagnosis, and plan were formulated together and I agree with the documentation in the resident's note.  Shawn Sick, MD

## 2024-01-29 NOTE — Progress Notes (Signed)
 "  CC: Acute Concern of Left breast pain and mass  HPI:  Kim Werner is a 49 y.o. 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 Please see problem based assessment and plan for further history.  ROS  Medications: Current Outpatient Medications  Medication Instructions   Accu-Chek Softclix Lancets lancets Use as directed up to four times daily   atorvastatin  (LIPITOR) 40 mg, Oral, Daily   blood glucose meter kit and supplies Dispense based on patient and insurance preference. Use up to four times daily as directed. (FOR ICD-10 E10.9, E11.9).   Blood Glucose Monitoring Suppl (ACCU-CHEK GUIDE ME) w/Device KIT Use as directed up to four times daily.   Calcium  Carbonate Antacid (TUMS PO) 2 tablets, As needed   Continuous Blood Gluc Receiver (DEXCOM G7 RECEIVER) DEVI Please use as directed to check your blood sugar.   Continuous Glucose Sensor (DEXCOM G7 SENSOR) MISC Please use as directed to check your blood sugar.   doxycycline  (VIBRA -TABS) 100 mg, Oral, 2 times daily   ferrous sulfate  325 mg, Oral, Every other day   glucose blood test strip Use as directed up to four times daily   insulin  aspart (NOVOLOG ) 8 Units, Subcutaneous, 3 times daily before meals   Insulin  Pen Needle 32G X 4 MM MISC 1 Container, Does not apply, Daily, Use new needle for insulin  injection up to four times a day.   Lantus  SoloStar 25 Units, Subcutaneous, Daily   Mounjaro  15 mg, Subcutaneous, Weekly   Olmesartan -amLODIPine -HCTZ 40-10-25 MG TABS 1 tablet, Oral, Daily     Physical Exam:  Vitals:   01/29/24 1516 01/29/24 1518  BP: (!) 147/87 (!) 141/82  Pulse: 100 95  Temp: 98.1 F (36.7 C)   TempSrc: Oral   SpO2: 100%   Weight: 266 lb 6.4 oz (120.8 kg)   Height: 5' 7 (1.702 m)     Physical Exam Chest:  Breasts:     Right: No inverted nipple, nipple discharge, skin change or tenderness.     Left: Skin change and tenderness present. No inverted nipple or nipple discharge.       Comments: Roughly 2 x 2 cm mass left breast at the 9:00 area of the areola junction.  The mass is rubbery.  There is associated tenderness in the region with surrounding erythema as well.  Mass in the 6 o'clock position of the right breast as well.  No erythema      Assessment & Plan:   Assessment & Plan Mass of upper inner quadrant of left breast The patient reports that she has had a mass has been enlarging in size of her left breast.  It has been present for 2 months but is specifically worsened over the past 2 weeks and become more painful over time.  She denies fevers, chills, nausea, vomiting.  She does not have pain unless something is pressing up against it.  She has tried to manage her symptoms with a hot compress, Tylenol , ibuprofen .  On physical exam there is a mobile, rubbery, 2 x 2 cm mass of the left breast at the 9:00 region of the areolar junction.  The mass is tender to touch with surrounding erythema.  Differential for this includes abscess, fibroadenoma, fibrocystic changes, carcinoma, cellular atypia.  Point-of-care ultrasound demonstrated a heterogeneous collection with internal septations that  was of mixed echodensity.  There was an overlying area of hypodensity.  Given her erythema and tenderness, it is possible that there is some amount of infection going on but it may be a superimposed infection on a breast mass given the heterogenicity.  Regardless, the patient has been recommended to follow-up for multiple stereotactic guided biopsies of the bilateral breasts in the past given abnormal mammography. Per mammography report in August 2024, she needs to return for additional biopsies to include the right breast retroareolar region, left upper outer breast, right breast calcifications, and ultrasound guided biopsy  of the lower outer left breast.  She was also supposed to see [facility] but was lost to follow-up.   - Doxycycline  100 mg twice daily for 7 days to treat for abscess for superimposed infection - Diagnostic mammogram ordered of bilateral breasts given history - Follow-up in 2 weeks to discuss other portions of chronic care as the patient declined to discuss this at today's visit.   Orders Placed This Encounter  Procedures   MM 3D DIAGNOSTIC MAMMOGRAM BILATERAL BREAST    Standing Status:   Future    Expiration Date:   01/28/2025    Scheduling Instructions:     Patient was also supposed to follow up for biopsies of other breast lesion.    Reason for Exam (SYMPTOM  OR DIAGNOSIS REQUIRED):   Bilateral breast lump    Is the patient pregnant?:   No    Preferred imaging location?:   [facility]    Patient seen with Dr. Jone Shawn Melvenia Napoleon, MD Internal [facility] Center Internal Medicine Resident PGY-1 Clinic Phone: [phone] Please contact the on call pager at [phone] for any urgent or emergent needs.  "

## 2024-01-29 NOTE — Assessment & Plan Note (Signed)
 The patient reports that she has had a mass has been enlarging in size of her left breast.  It has been present for 2 months but is specifically worsened over the past 2 weeks and become more painful over time.  She denies fevers, chills, nausea, vomiting.  She does not have pain unless something is pressing up against it.  She has tried to manage her symptoms with a hot compress, Tylenol , ibuprofen .  On physical exam there is a mobile, rubbery, 2 x 2 cm mass of the left breast at the 9:00 region of the areolar junction.  The mass is tender to touch with surrounding erythema.  Differential for this includes abscess, fibroadenoma, fibrocystic changes, carcinoma, cellular atypia.  Point-of-care ultrasound demonstrated a heterogeneous collection with internal septations that was of mixed echodensity.  There was an overlying area of hypodensity.  Given her erythema and tenderness, it is possible that there is some amount of infection going on but it may be a superimposed infection on a breast mass given the heterogenicity.  Regardless, the patient has been recommended to follow-up for multiple stereotactic guided biopsies of the bilateral breasts in the past given abnormal mammography. Per mammography report in August 2024, she needs to return for additional biopsies to include the right breast retroareolar region, left upper outer breast, right breast calcifications, and ultrasound guided biopsy of the lower outer left breast.  She was also supposed to see [facility] but was lost to follow-up.   - Doxycycline  100 mg twice daily for 7 days to treat for abscess for superimposed infection - Diagnostic mammogram ordered of bilateral breasts given history - Follow-up in 2 weeks to discuss other portions of chronic care as the patient declined to discuss this at today's visit.

## 2024-01-31 ENCOUNTER — Other Ambulatory Visit: Payer: Self-pay

## 2024-01-31 DIAGNOSIS — N6322 Unspecified lump in the left breast, upper inner quadrant: Secondary | ICD-10-CM

## 2024-02-01 ENCOUNTER — Telehealth: Payer: Self-pay | Admitting: *Deleted

## 2024-02-01 NOTE — Telephone Encounter (Signed)
 Correction(typing error correction) 3:10 pm / appointment time.  Arrival time 2:50 pm  1 / 27 /2026.

## 2024-02-01 NOTE — Telephone Encounter (Signed)
 Mammogram /U/S x 2- appointment 1-27/2026 @ 3:10 pm ti arruve 2:L50 pm.  Appointment mailed to the patient attached the information regarding the 75.00 no show fee.

## 2024-02-08 ENCOUNTER — Other Ambulatory Visit: Payer: Self-pay

## 2024-02-08 DIAGNOSIS — N6312 Unspecified lump in the right breast, upper inner quadrant: Secondary | ICD-10-CM

## 2024-02-08 DIAGNOSIS — N6322 Unspecified lump in the left breast, upper inner quadrant: Secondary | ICD-10-CM

## 2024-02-08 DIAGNOSIS — N6320 Unspecified lump in the left breast, unspecified quadrant: Secondary | ICD-10-CM

## 2024-02-12 ENCOUNTER — Ambulatory Visit: Payer: Self-pay | Admitting: Student

## 2024-02-13 ENCOUNTER — Encounter

## 2024-02-13 ENCOUNTER — Ambulatory Visit
Admission: RE | Admit: 2024-02-13 | Discharge: 2024-02-13 | Disposition: A | Source: Ambulatory Visit | Attending: Internal Medicine

## 2024-02-13 ENCOUNTER — Encounter: Payer: Self-pay | Admitting: Gastroenterology

## 2024-02-13 ENCOUNTER — Other Ambulatory Visit

## 2024-02-13 DIAGNOSIS — N6312 Unspecified lump in the right breast, upper inner quadrant: Secondary | ICD-10-CM

## 2024-02-13 DIAGNOSIS — N6322 Unspecified lump in the left breast, upper inner quadrant: Secondary | ICD-10-CM

## 2024-02-13 DIAGNOSIS — N6310 Unspecified lump in the right breast, unspecified quadrant: Secondary | ICD-10-CM

## 2024-02-14 ENCOUNTER — Other Ambulatory Visit (HOSPITAL_COMMUNITY)
Admission: RE | Admit: 2024-02-14 | Discharge: 2024-02-14 | Disposition: A | Discharge status: Home / Self Care | Source: Ambulatory Visit | Attending: Internal Medicine | Admitting: Internal Medicine

## 2024-02-14 ENCOUNTER — Ambulatory Visit: Payer: Self-pay | Admitting: Student

## 2024-02-14 ENCOUNTER — Ambulatory Visit: Admitting: Student

## 2024-02-14 VITALS — BP 154/90 | HR 99 | Temp 98.7°F | Ht 67.0 in | Wt 265.4 lb

## 2024-02-14 DIAGNOSIS — B379 Candidiasis, unspecified: Secondary | ICD-10-CM | POA: Insufficient documentation

## 2024-02-14 DIAGNOSIS — Z794 Long term (current) use of insulin: Secondary | ICD-10-CM

## 2024-02-14 DIAGNOSIS — Z7985 Long-term (current) use of injectable non-insulin antidiabetic drugs: Secondary | ICD-10-CM

## 2024-02-14 DIAGNOSIS — E1169 Type 2 diabetes mellitus with other specified complication: Secondary | ICD-10-CM | POA: Diagnosis not present

## 2024-02-14 DIAGNOSIS — N6322 Unspecified lump in the left breast, upper inner quadrant: Secondary | ICD-10-CM

## 2024-02-14 DIAGNOSIS — I5032 Chronic diastolic (congestive) heart failure: Secondary | ICD-10-CM | POA: Diagnosis not present

## 2024-02-14 DIAGNOSIS — I110 Hypertensive heart disease with heart failure: Secondary | ICD-10-CM

## 2024-02-14 DIAGNOSIS — I10 Essential (primary) hypertension: Secondary | ICD-10-CM

## 2024-02-14 LAB — GLUCOSE, CAPILLARY: Glucose-Capillary: 235 mg/dL — ABNORMAL HIGH (ref 70–99)

## 2024-02-14 LAB — POCT GLYCOSYLATED HEMOGLOBIN (HGB A1C): HbA1c, POC (controlled diabetic range): 10.1 % — AB (ref 0.0–7.0)

## 2024-02-14 MED ORDER — FLUCONAZOLE 150 MG PO TABS: ORAL_TABLET | ORAL | 0 refills | Status: AC

## 2024-02-14 MED ORDER — LANTUS SOLOSTAR 100 UNIT/ML ~~LOC~~ SOPN: 30.0000 [IU] | PEN_INJECTOR | Freq: Every day | SUBCUTANEOUS | 0 refills | Status: AC

## 2024-02-14 MED ORDER — INSULIN ASPART 100 UNIT/ML FLEXPEN: 10.0000 [IU] | PEN_INJECTOR | Freq: Three times a day (TID) | SUBCUTANEOUS | 11 refills | Status: AC

## 2024-02-14 NOTE — Assessment & Plan Note (Signed)
Referral to General Surgery sent.

## 2024-02-14 NOTE — Assessment & Plan Note (Signed)
 Patient reports a 3-day history of vaginal itching that started after she completed her antibiotic course.  She has had similar symptoms in the past when she has had prior yeast infections. Plan: -Diflucan  150 mg, 2 tablets provided with instructions to take 1 dose today followed by a dose in 3 days if symptoms persist -Self swab collected

## 2024-02-14 NOTE — Assessment & Plan Note (Signed)
 Patient presents with a history of hypertension with a blood pressure today of 160/94, recheck of 154/90. Their hypertension is uncontrolled on a regimen of olmesartan -amlodipine -hydrochlorothiazide  40-10-25 mg daily. She does report missing doses of her medication, she has not taken a dose today. Prior BMP in July 2025, Scr was 1.01.  They check their blood pressure at home with an average of 130s/90s.  Plan: -Continue current regimen of: olmesartan -amlodipine -hydrochlorothiazide  40-10-25 mg daily  -BP log for one month, patient will follow up in 4 weeks with cuff and BP log -BMP today

## 2024-02-14 NOTE — Patient Instructions (Signed)
 Thank you, [name] for allowing us  to provide your care today. Today we discussed blood pressure, diabetes, and possible yeast infection.    For the blood pressure: Keep track of the blood pressure over the next four weeks  For the diabetes: We increased Lantus  30 units daily, Novolog  10 units with meals, and mounjaro  15 mg weekly For the constipation, use miralax. Use the CGM! Call us  if there is any low blood sugars: less than 70!   For the yeats infection: Use fluconazole , take one tablet today and if you still have symptoms in 3 days, take an additional dose  For the breast mass: Please see surgery to have this biopsied! It is important that you have this done   I have ordered the following labs for you:   Lab Orders         Glucose, capillary         Basic metabolic panel with GFR         POC Hbg A1C       Referrals ordered today:    Referral Orders         Ambulatory referral to General Surgery      I have ordered the following medication/changed the following medications:   Stop the following medications: Medications Discontinued During This Encounter  Medication Reason   insulin  aspart (NOVOLOG ) 100 UNIT/ML FlexPen Reorder   LANTUS  SOLOSTAR 100 UNIT/ML Solostar Pen Reorder     Start the following medications: Meds ordered this encounter  Medications   insulin  aspart (NOVOLOG ) 100 UNIT/ML FlexPen    Sig: Inject 10 Units into the skin 3 (three) times daily before meals.    Dispense:  15 mL    Refill:  11   LANTUS  SOLOSTAR 100 UNIT/ML Solostar Pen    Sig: Inject 30 Units into the skin daily.    Dispense:  15 mL    Refill:  0     Follow up: 4 weeks    Remember:   Should you have any questions or concerns please call the internal medicine clinic at [phone].     Please note that our late policy has changed.  If you are more than 15 minutes late to your appointment, you may be asked to reschedule your appointment.  Dr. Kandis, D.O. [facility]

## 2024-02-14 NOTE — Assessment & Plan Note (Signed)
 Patient presents with a history of T2DM with a prior A1c of 9.9 in October 2025.  Patient's A1c today is 10.1.  They are on a regimen of mounjaro  7.5 mg weekly (has not taken this dose yet), lantus  25 units daily, novolog  10 units with meals.  Patient denies hypoglycemia.   Average fasting blood glucose is 150. She does not use a CGM currently due to the beeping and alarm setting but is open to trying it in the future. Additionally, she reports missing insulin  dosing every now and then.   Plan: -Continue regimen of:Increase Lantus  to 30 units daily, Start mounjaro  15 weekly, Novolog  10 units with meals  -A1c today -Urine ACR   -One month return

## 2024-02-14 NOTE — Progress Notes (Unsigned)
" ° °  Established Patient Office Visit  Subjective   Patient ID: Kim Werner, female    DOB: 1975-04-01  Age: 49 y.o. MRN: [id number]  No chief complaint on file.   Kim Werner is a 49 y.o. who presents to the clinic for ***. Please see problem based assessment and plan for additional details.   Patient presents with a history of T2DM with a prior A1c of 9.9 in October 2025.  Patient's A1c today is ***.  They are on a regimen of mounjaro  15 mg weekly,lantus  25 units daily, novolog  8 units with meals,***.  Patient *** hypoglycemia.  Per review of CGM, average blood glucose is ***.  Average fasting blood glucose is ***. Plan: -Continue regimen of: -A1c today -Urine ACR    ---------------------------- Patient presents with a history of hypertension with a blood pressure today of***. Their hypertension is***controlled on a regimen of olmesartan -amlodipine -hydrochlorothiazide  40-10-25 mg daily.  Prior BMP in July 2025, Scr was 1.01.  Patient***symptoms of hypotension at home.  They***check their blood pressure at home with an average of***. Plan: -***Continue current regimen of: -BMP today   Patient Active Problem List   Diagnosis Date Noted   Mass of upper inner quadrant of left breast 01/29/2024   Umbilical hernia 10/30/2023   Chest discomfort 04/03/2023   At risk for sleep apnea 08/10/2022   Breast pain 02/10/2022   Iron deficiency anemia 12/02/2021   Health care maintenance 12/02/2021   Resistant hypertension 08/13/2021   Hyperlipidemia associated with type 2 diabetes mellitus (HCC) 08/05/2021   Obesity, Class III, BMI 40-49.9 (morbid obesity) (HCC) 08/05/2021   Type 2 diabetes mellitus with other specified complication (HCC) 03/26/2021   Hypertensive heart disease with chronic diastolic congestive heart failure (HCC) 03/2021      Objective:     There were no vitals taken for this visit. BP Readings from Last 3 Encounters:  01/29/24 (!) 141/82  10/30/23 (!) 156/91   07/25/23 (!) 148/87   Wt Readings from Last 3 Encounters:  01/29/24 266 lb 6.4 oz (120.8 kg)  10/30/23 272 lb 9.6 oz (123.7 kg)  08/24/23 270 lb 9.6 oz (122.7 kg)      Physical Exam   No results found for any visits on 02/14/24.  Last metabolic panel Lab Results  Component Value Date   GLUCOSE 275 (H) 07/25/2023   NA 133 (L) 07/25/2023   K 3.7 07/25/2023   CL 95 (L) 07/25/2023   CO2 20 07/25/2023   BUN 14 07/25/2023   CREATININE 1.01 (H) 07/25/2023   EGFR 70 07/25/2023   CALCIUM  10.0 07/25/2023   PHOS 4.0 03/27/2021   PROT 7.6 08/05/2021   ALBUMIN 3.5 08/05/2021   BILITOT 0.6 08/05/2021   ALKPHOS 87 08/05/2021   AST 31 08/05/2021   ALT 31 08/05/2021   ANIONGAP 12 07/07/2022   Last lipids Lab Results  Component Value Date   CHOL 209 (H) 04/03/2023   HDL 59 04/03/2023   LDLCALC 131 (H) 04/03/2023   TRIG 107 04/03/2023   CHOLHDL 3.5 04/03/2023   Last hemoglobin A1c Lab Results  Component Value Date   HGBA1C 9.9 (A) 10/30/2023      The 10-year ASCVD risk score (Arnett DK, et al., 2019) is: 4.1%    Assessment & Plan:   Problem List Items Addressed This Visit   None   No follow-ups on file.    Damien Lease, DO  "

## 2024-02-14 NOTE — Progress Notes (Signed)
 Internal Medicine Clinic Attending  Case discussed with the resident at the time of the visit.  We reviewed the resident's history and exam and pertinent patient test results.  I agree with the assessment, diagnosis, and plan of care documented in the resident's note.

## 2024-02-14 NOTE — Progress Notes (Signed)
 "  Established Patient Office Visit  Subjective   Patient ID: Kim Werner, female    DOB: 06-24-1975  Age: 49 y.o. MRN: [id number]  Chief Complaint  Patient presents with   Diabetes    Kim Werner is a 49 y.o. who presents to the clinic for T2DM, HTN. Please see problem based assessment and plan for additional details.    Patient Active Problem List   Diagnosis Date Noted   Yeast infection 02/14/2024   Mass of upper inner quadrant of left breast 01/29/2024   Umbilical hernia 10/30/2023   Chest discomfort 04/03/2023   At risk for sleep apnea 08/10/2022   Breast pain 02/10/2022   Iron deficiency anemia 12/02/2021   Health care maintenance 12/02/2021   HTN (hypertension) 08/13/2021   Hyperlipidemia associated with type 2 diabetes mellitus (HCC) 08/05/2021   Obesity, Class III, BMI 40-49.9 (morbid obesity) (HCC) 08/05/2021   Type 2 diabetes mellitus with other specified complication (HCC) 03/26/2021   Hypertensive heart disease with chronic diastolic congestive heart failure (HCC) 03/2021      Objective:     BP (!) 154/90 (BP Location: Left Arm, Patient Position: Sitting, Cuff Size: Small)   Pulse 99   Temp 98.7 F (37.1 C) (Oral)   Ht 5' 7 (1.702 m)   Wt 265 lb 6.4 oz (120.4 kg)   SpO2 99%   BMI 41.57 kg/m  BP Readings from Last 3 Encounters:  02/14/24 (!) 154/90  01/29/24 (!) 141/82  10/30/23 (!) 156/91   Wt Readings from Last 3 Encounters:  02/14/24 265 lb 6.4 oz (120.4 kg)  01/29/24 266 lb 6.4 oz (120.8 kg)  10/30/23 272 lb 9.6 oz (123.7 kg)      Physical Exam Vitals reviewed.  Constitutional:      General: She is not in acute distress.    Appearance: She is not ill-appearing, toxic-appearing or diaphoretic.  Cardiovascular:     Rate and Rhythm: Normal rate and regular rhythm.  Pulmonary:     Effort: Pulmonary effort is normal. No respiratory distress.     Breath sounds: Normal breath sounds.  Musculoskeletal:     Right lower leg: No  edema.     Left lower leg: No edema.  Skin:    General: Skin is warm and dry.  Neurological:     Mental Status: She is alert.  Psychiatric:        Mood and Affect: Mood and affect normal.     Results for orders placed or performed in visit on 02/14/24  Glucose, capillary  Result Value Ref Range   Glucose-Capillary 235 (H) 70 - 99 mg/dL  POC Hbg J8R  Result Value Ref Range   Hemoglobin A1C     HbA1c POC (<> result, manual entry)     HbA1c, POC (prediabetic range)     HbA1c, POC (controlled diabetic range) 10.1 (A) 0.0 - 7.0 %    Last metabolic panel Lab Results  Component Value Date   GLUCOSE 275 (H) 07/25/2023   NA 133 (L) 07/25/2023   K 3.7 07/25/2023   CL 95 (L) 07/25/2023   CO2 20 07/25/2023   BUN 14 07/25/2023   CREATININE 1.01 (H) 07/25/2023   EGFR 70 07/25/2023   CALCIUM  10.0 07/25/2023   PHOS 4.0 03/27/2021   PROT 7.6 08/05/2021   ALBUMIN 3.5 08/05/2021   BILITOT 0.6 08/05/2021   ALKPHOS 87 08/05/2021   AST 31 08/05/2021   ALT 31 08/05/2021   ANIONGAP 12 07/07/2022  Last lipids Lab Results  Component Value Date   CHOL 209 (H) 04/03/2023   HDL 59 04/03/2023   LDLCALC 131 (H) 04/03/2023   TRIG 107 04/03/2023   CHOLHDL 3.5 04/03/2023   Last hemoglobin A1c Lab Results  Component Value Date   HGBA1C 10.1 (A) 02/14/2024      The 10-year ASCVD risk score (Arnett DK, et al., 2019) is: 6%    Assessment & Plan:   Problem List Items Addressed This Visit       Cardiovascular and Mediastinum   Hypertensive heart disease with chronic diastolic congestive heart failure (HCC) (Chronic)   Relevant Orders   Basic metabolic panel with GFR   HTN (hypertension)   Patient presents with a history of hypertension with a blood pressure today of 160/94, recheck of 154/90. Their hypertension is uncontrolled on a regimen of olmesartan -amlodipine -hydrochlorothiazide  40-10-25 mg daily. She does report missing doses of her medication, she has not taken a dose today.  Prior BMP in July 2025, Scr was 1.01.  They check their blood pressure at home with an average of 130s/90s.  Plan: -Continue current regimen of: olmesartan -amlodipine -hydrochlorothiazide  40-10-25 mg daily  -BP log for one month, patient will follow up in 4 weeks with cuff and BP log -BMP today         Endocrine   Type 2 diabetes mellitus with other specified complication [facility]) - Primary   Patient presents with a history of T2DM with a prior A1c of 9.9 in October 2025.  Patient's A1c today is 10.1.  They are on a regimen of mounjaro  7.5 mg weekly (has not taken this dose yet), lantus  25 units daily, novolog  10 units with meals.  Patient denies hypoglycemia.   Average fasting blood glucose is 150. She does not use a CGM currently due to the beeping and alarm setting but is open to trying it in the future. Additionally, she reports missing insulin  dosing every now and then.   Plan: -Continue regimen of:Increase Lantus  to 30 units daily, Start mounjaro  15 weekly, Novolog  10 units with meals  -A1c today -Urine ACR   -One month return       Relevant Medications   insulin  aspart (NOVOLOG ) 100 UNIT/ML FlexPen   LANTUS  SOLOSTAR 100 UNIT/ML Solostar Pen   Other Relevant Orders   POC Hbg A1C (Completed)   Microalbumin / Creatinine Urine Ratio     Other   Mass of upper inner quadrant of left breast   Referral to General Surgery sent.       Relevant Orders   Ambulatory referral to General Surgery   Yeast infection   Patient reports a 3-day history of vaginal itching that started after she completed her antibiotic course.  She has had similar symptoms in the past when she has had prior yeast infections. Plan: -Diflucan  150 mg, 2 tablets provided with instructions to take 1 dose today followed by a dose in 3 days if symptoms persist -Self swab collected      Relevant Medications   fluconazole  (DIFLUCAN ) 150 MG tablet   Other Relevant Orders   Cervicovaginal ancillary only   Other Visit  Diagnoses       Type 2 diabetes mellitus without complication, without long-term current use of insulin  (HCC)       Relevant Medications   insulin  aspart (NOVOLOG ) 100 UNIT/ML FlexPen   LANTUS  SOLOSTAR 100 UNIT/ML Solostar Pen       Return in about 4 weeks (around 03/13/2024) for BP, T2DM.  Damien Lease, DO  "

## 2024-02-15 ENCOUNTER — Ambulatory Visit: Payer: Self-pay | Admitting: Student

## 2024-02-15 LAB — MICROALBUMIN / CREATININE URINE RATIO
Creatinine, Urine: 184.5 mg/dL
Microalb/Creat Ratio: 21 mg/g{creat} (ref 0–29)
Microalbumin, Urine: 38.7 ug/mL

## 2024-02-15 LAB — BASIC METABOLIC PANEL WITH GFR
BUN/Creatinine Ratio: 20 (ref 9–23)
BUN: 18 mg/dL (ref 6–24)
CO2: 23 mmol/L (ref 20–29)
Calcium: 9.6 mg/dL (ref 8.7–10.2)
Chloride: 98 mmol/L (ref 96–106)
Creatinine, Ser: 0.9 mg/dL (ref 0.57–1.00)
Glucose: 206 mg/dL — ABNORMAL HIGH (ref 70–99)
Potassium: 3.8 mmol/L (ref 3.5–5.2)
Sodium: 137 mmol/L (ref 134–144)
eGFR: 80 mL/min/{1.73_m2}

## 2024-02-15 LAB — CERVICOVAGINAL ANCILLARY ONLY
Bacterial Vaginitis (gardnerella): NEGATIVE
Candida Glabrata: NEGATIVE
Candida Vaginitis: POSITIVE — AB
Comment: NEGATIVE
Comment: NEGATIVE
Comment: NEGATIVE

## 2024-02-21 ENCOUNTER — Ambulatory Visit: Payer: Self-pay | Admitting: General Surgery

## 2024-03-08 ENCOUNTER — Encounter

## 2024-03-13 ENCOUNTER — Ambulatory Visit: Payer: Self-pay | Admitting: Student

## 2024-03-22 ENCOUNTER — Encounter: Admitting: Gastroenterology
